# Patient Record
Sex: Female | Born: 1990 | Race: White | Hispanic: No | Marital: Married
Health system: Southern US, Community
[De-identification: ages and names within clinical notes are randomized; demographics above are authoritative.]

## PROBLEM LIST (undated history)

## (undated) DIAGNOSIS — F988 Other specified behavioral and emotional disorders with onset usually occurring in childhood and adolescence: Secondary | ICD-10-CM

## (undated) DIAGNOSIS — M5481 Occipital neuralgia: Secondary | ICD-10-CM

---

## 2016-05-01 ENCOUNTER — Other Ambulatory Visit (HOSPITAL_COMMUNITY): Payer: Self-pay

## 2016-05-01 ENCOUNTER — Inpatient Hospital Stay
Admission: RE | Admit: 2016-05-01 | Discharge: 2016-05-20 | Disposition: A | Discharge status: Rehabilitation Facility | Payer: Self-pay | Attending: Internal Medicine | Admitting: Internal Medicine

## 2016-05-01 DIAGNOSIS — I619 Nontraumatic intracerebral hemorrhage, unspecified: Secondary | ICD-10-CM

## 2016-05-01 DIAGNOSIS — Z931 Gastrostomy status: Secondary | ICD-10-CM

## 2016-05-01 DIAGNOSIS — I629 Nontraumatic intracranial hemorrhage, unspecified: Secondary | ICD-10-CM

## 2016-05-01 DIAGNOSIS — J9690 Respiratory failure, unspecified, unspecified whether with hypoxia or hypercapnia: Secondary | ICD-10-CM

## 2016-05-01 HISTORY — DX: Other specified behavioral and emotional disorders with onset usually occurring in childhood and adolescence: F98.8

## 2016-05-01 HISTORY — DX: Occipital neuralgia: M54.81

## 2016-05-01 MED ORDER — IOPAMIDOL (ISOVUE-300) INJECTION 61%
INTRAVENOUS | Status: AC
  Administered 2016-05-01: 30 mL via GASTROSTOMY
  Filled 2016-05-01: qty 50

## 2016-05-02 ENCOUNTER — Other Ambulatory Visit (HOSPITAL_COMMUNITY): Payer: Self-pay

## 2016-05-02 LAB — COMPREHENSIVE METABOLIC PANEL
ALK PHOS: 103 U/L (ref 38–126)
ALT: 59 U/L — AB (ref 14–54)
AST: 34 U/L (ref 15–41)
Albumin: 3.5 g/dL (ref 3.5–5.0)
Anion gap: 9 (ref 5–15)
BUN: 21 mg/dL — AB (ref 6–20)
CHLORIDE: 100 mmol/L — AB (ref 101–111)
CO2: 31 mmol/L (ref 22–32)
CREATININE: 0.65 mg/dL (ref 0.44–1.00)
Calcium: 9.7 mg/dL (ref 8.9–10.3)
GFR calc Af Amer: >60 mL/min (ref >60)
Glucose, Bld: 97 mg/dL (ref 65–99)
Potassium: 4 mmol/L (ref 3.5–5.1)
Sodium: 140 mmol/L (ref 135–145)
Total Bilirubin: 0.5 mg/dL (ref 0.3–1.2)
Total Protein: 7.1 g/dL (ref 6.5–8.1)

## 2016-05-02 LAB — CBC WITH DIFFERENTIAL/PLATELET
Basophils Absolute: 0 10*3/uL (ref 0.0–0.1)
Basophils Relative: 0 %
EOS ABS: 0.3 10*3/uL (ref 0.0–0.7)
EOS PCT: 4 %
HCT: 32.4 % — ABNORMAL LOW (ref 36.0–46.0)
Hemoglobin: 9.7 g/dL — ABNORMAL LOW (ref 12.0–15.0)
LYMPHS ABS: 2.3 10*3/uL (ref 0.7–4.0)
Lymphocytes Relative: 34 %
MCH: 27.3 pg (ref 26.0–34.0)
MCHC: 29.9 g/dL — AB (ref 30.0–36.0)
MCV: 91.3 fL (ref 78.0–100.0)
Monocytes Absolute: 0.7 10*3/uL (ref 0.1–1.0)
Monocytes Relative: 10 %
Neutro Abs: 3.4 10*3/uL (ref 1.7–7.7)
Neutrophils Relative %: 52 %
PLATELETS: 617 10*3/uL — AB (ref 150–400)
RBC: 3.55 MIL/uL — AB (ref 3.87–5.11)
RDW: 15.3 % (ref 11.5–15.5)
WBC: 6.6 10*3/uL (ref 4.0–10.5)

## 2016-05-05 LAB — BASIC METABOLIC PANEL
Anion gap: 12 (ref 5–15)
BUN: 16 mg/dL (ref 6–20)
CALCIUM: 9.9 mg/dL (ref 8.9–10.3)
CO2: 28 mmol/L (ref 22–32)
CREATININE: 0.59 mg/dL (ref 0.44–1.00)
Chloride: 100 mmol/L — ABNORMAL LOW (ref 101–111)
Glucose, Bld: 79 mg/dL (ref 65–99)
Potassium: 4.5 mmol/L (ref 3.5–5.1)
SODIUM: 140 mmol/L (ref 135–145)

## 2016-05-05 LAB — CBC
HCT: 37.8 % (ref 36.0–46.0)
Hemoglobin: 11.5 g/dL — ABNORMAL LOW (ref 12.0–15.0)
MCH: 27.9 pg (ref 26.0–34.0)
MCHC: 30.4 g/dL (ref 30.0–36.0)
MCV: 91.7 fL (ref 78.0–100.0)
PLATELETS: 526 10*3/uL — AB (ref 150–400)
RBC: 4.12 MIL/uL (ref 3.87–5.11)
RDW: 15.3 % (ref 11.5–15.5)
WBC: 8.8 10*3/uL (ref 4.0–10.5)

## 2016-05-07 LAB — TSH: TSH: 1.893 u[IU]/mL (ref 0.350–4.500)

## 2016-05-07 LAB — CORTISOL: CORTISOL PLASMA: 14.9 ug/dL

## 2016-05-09 LAB — CBC
HCT: 35.9 % — ABNORMAL LOW (ref 36.0–46.0)
HEMOGLOBIN: 11.1 g/dL — AB (ref 12.0–15.0)
MCH: 28.2 pg (ref 26.0–34.0)
MCHC: 30.9 g/dL (ref 30.0–36.0)
MCV: 91.3 fL (ref 78.0–100.0)
Platelets: 474 10*3/uL — ABNORMAL HIGH (ref 150–400)
RBC: 3.93 MIL/uL (ref 3.87–5.11)
RDW: 15.5 % (ref 11.5–15.5)
WBC: 7.8 10*3/uL (ref 4.0–10.5)

## 2016-05-09 LAB — BASIC METABOLIC PANEL
ANION GAP: 10 (ref 5–15)
BUN: 18 mg/dL (ref 6–20)
CALCIUM: 10 mg/dL (ref 8.9–10.3)
CO2: 27 mmol/L (ref 22–32)
CREATININE: 0.65 mg/dL (ref 0.44–1.00)
Chloride: 103 mmol/L (ref 101–111)
GFR calc non Af Amer: >60 mL/min (ref >60)
Glucose, Bld: 95 mg/dL (ref 65–99)
Potassium: 4.3 mmol/L (ref 3.5–5.1)
SODIUM: 140 mmol/L (ref 135–145)

## 2016-05-14 LAB — BASIC METABOLIC PANEL
Anion gap: 9 (ref 5–15)
BUN: 15 mg/dL (ref 6–20)
CHLORIDE: 104 mmol/L (ref 101–111)
CO2: 28 mmol/L (ref 22–32)
CREATININE: 0.7 mg/dL (ref 0.44–1.00)
Calcium: 9.8 mg/dL (ref 8.9–10.3)
GFR calc non Af Amer: >60 mL/min (ref >60)
Glucose, Bld: 110 mg/dL — ABNORMAL HIGH (ref 65–99)
Potassium: 4 mmol/L (ref 3.5–5.1)
Sodium: 141 mmol/L (ref 135–145)

## 2016-05-14 LAB — CBC
HEMATOCRIT: 36.7 % (ref 36.0–46.0)
HEMOGLOBIN: 11.3 g/dL — AB (ref 12.0–15.0)
MCH: 27.8 pg (ref 26.0–34.0)
MCHC: 30.8 g/dL (ref 30.0–36.0)
MCV: 90.2 fL (ref 78.0–100.0)
Platelets: 496 10*3/uL — ABNORMAL HIGH (ref 150–400)
RBC: 4.07 MIL/uL (ref 3.87–5.11)
RDW: 15.2 % (ref 11.5–15.5)
WBC: 7.8 10*3/uL (ref 4.0–10.5)

## 2016-05-15 ENCOUNTER — Other Ambulatory Visit (HOSPITAL_COMMUNITY): Payer: Self-pay

## 2016-05-19 ENCOUNTER — Inpatient Hospital Stay (HOSPITAL_BASED_OUTPATIENT_CLINIC_OR_DEPARTMENT_OTHER)
Admission: RE | Admit: 2016-05-19 | Discharge: 2016-05-19 | Disposition: A | Discharge status: Home / Self Care | Payer: Self-pay | Source: Home / Self Care | Attending: Internal Medicine | Admitting: Internal Medicine

## 2016-05-19 ENCOUNTER — Encounter: Payer: Self-pay | Admitting: Physical Medicine and Rehabilitation

## 2016-05-19 DIAGNOSIS — M7989 Other specified soft tissue disorders: Secondary | ICD-10-CM

## 2016-05-19 LAB — BASIC METABOLIC PANEL
Anion gap: 12 (ref 5–15)
BUN: 14 mg/dL (ref 6–20)
CALCIUM: 10 mg/dL (ref 8.9–10.3)
CO2: 26 mmol/L (ref 22–32)
CREATININE: 0.68 mg/dL (ref 0.44–1.00)
Chloride: 103 mmol/L (ref 101–111)
GFR calc Af Amer: >60 mL/min (ref >60)
Glucose, Bld: 82 mg/dL (ref 65–99)
Potassium: 4.4 mmol/L (ref 3.5–5.1)
SODIUM: 141 mmol/L (ref 135–145)

## 2016-05-19 LAB — CBC
HCT: 40.3 % (ref 36.0–46.0)
HEMOGLOBIN: 12.5 g/dL (ref 12.0–15.0)
MCH: 27.8 pg (ref 26.0–34.0)
MCHC: 31 g/dL (ref 30.0–36.0)
MCV: 89.8 fL (ref 78.0–100.0)
PLATELETS: 433 10*3/uL — AB (ref 150–400)
RBC: 4.49 MIL/uL (ref 3.87–5.11)
RDW: 15.3 % (ref 11.5–15.5)
WBC: 8.7 10*3/uL (ref 4.0–10.5)

## 2016-05-19 NOTE — Progress Notes (Signed)
VASCULAR LAB PRELIMINARY  PRELIMINARY  PRELIMINARY  PRELIMINARY  Left lower extremity venous duplex completed.    Preliminary report:  Left:  No evidence of DVT, superficial thrombosis, or Baker's cyst.  Cheryl Gibbs, RVS 05/19/2016, 5:06 PM

## 2016-05-20 ENCOUNTER — Inpatient Hospital Stay (HOSPITAL_COMMUNITY)
Admission: RE | Admit: 2016-05-20 | Discharge: 2016-07-09 | DRG: 057 | Disposition: A | Discharge status: Other Acute Inpatient Hospital | Payer: Managed Care, Other (non HMO) | Source: Other Acute Inpatient Hospital | Attending: Physical Medicine & Rehabilitation | Admitting: Physical Medicine & Rehabilitation

## 2016-05-20 ENCOUNTER — Encounter: Payer: Self-pay | Admitting: Physical Medicine and Rehabilitation

## 2016-05-20 DIAGNOSIS — R1310 Dysphagia, unspecified: Secondary | ICD-10-CM | POA: Diagnosis present

## 2016-05-20 DIAGNOSIS — F988 Other specified behavioral and emotional disorders with onset usually occurring in childhood and adolescence: Secondary | ICD-10-CM | POA: Diagnosis present

## 2016-05-20 DIAGNOSIS — F4310 Post-traumatic stress disorder, unspecified: Secondary | ICD-10-CM | POA: Diagnosis present

## 2016-05-20 DIAGNOSIS — I69220 Aphasia following other nontraumatic intracranial hemorrhage: Secondary | ICD-10-CM

## 2016-05-20 DIAGNOSIS — N390 Urinary tract infection, site not specified: Secondary | ICD-10-CM | POA: Diagnosis not present

## 2016-05-20 DIAGNOSIS — I69191 Dysphagia following nontraumatic intracerebral hemorrhage: Secondary | ICD-10-CM

## 2016-05-20 DIAGNOSIS — I629 Nontraumatic intracranial hemorrhage, unspecified: Secondary | ICD-10-CM | POA: Diagnosis not present

## 2016-05-20 DIAGNOSIS — F4321 Adjustment disorder with depressed mood: Secondary | ICD-10-CM | POA: Diagnosis present

## 2016-05-20 DIAGNOSIS — R4701 Aphasia: Secondary | ICD-10-CM | POA: Diagnosis not present

## 2016-05-20 DIAGNOSIS — I69254 Hemiplegia and hemiparesis following other nontraumatic intracranial hemorrhage affecting left non-dominant side: Secondary | ICD-10-CM | POA: Diagnosis not present

## 2016-05-20 DIAGNOSIS — Z930 Tracheostomy status: Secondary | ICD-10-CM

## 2016-05-20 DIAGNOSIS — I959 Hypotension, unspecified: Secondary | ICD-10-CM | POA: Diagnosis not present

## 2016-05-20 DIAGNOSIS — G8110 Spastic hemiplegia affecting unspecified side: Secondary | ICD-10-CM | POA: Diagnosis not present

## 2016-05-20 DIAGNOSIS — R4189 Other symptoms and signs involving cognitive functions and awareness: Secondary | ICD-10-CM | POA: Diagnosis present

## 2016-05-20 DIAGNOSIS — I69391 Dysphagia following cerebral infarction: Secondary | ICD-10-CM | POA: Diagnosis not present

## 2016-05-20 DIAGNOSIS — G629 Polyneuropathy, unspecified: Secondary | ICD-10-CM

## 2016-05-20 DIAGNOSIS — R4702 Dysphasia: Secondary | ICD-10-CM | POA: Diagnosis not present

## 2016-05-20 DIAGNOSIS — M2450 Contracture, unspecified joint: Secondary | ICD-10-CM | POA: Diagnosis not present

## 2016-05-20 DIAGNOSIS — R569 Unspecified convulsions: Secondary | ICD-10-CM | POA: Diagnosis present

## 2016-05-20 DIAGNOSIS — Z9119 Patient's noncompliance with other medical treatment and regimen: Secondary | ICD-10-CM | POA: Diagnosis not present

## 2016-05-20 DIAGNOSIS — G441 Vascular headache, not elsewhere classified: Secondary | ICD-10-CM | POA: Diagnosis present

## 2016-05-20 DIAGNOSIS — R001 Bradycardia, unspecified: Secondary | ICD-10-CM

## 2016-05-20 DIAGNOSIS — R609 Edema, unspecified: Secondary | ICD-10-CM | POA: Diagnosis not present

## 2016-05-20 DIAGNOSIS — R2689 Other abnormalities of gait and mobility: Secondary | ICD-10-CM | POA: Diagnosis present

## 2016-05-20 DIAGNOSIS — S06349S Traumatic hemorrhage of right cerebrum with loss of consciousness of unspecified duration, sequela: Secondary | ICD-10-CM

## 2016-05-20 DIAGNOSIS — Z931 Gastrostomy status: Secondary | ICD-10-CM | POA: Diagnosis not present

## 2016-05-20 DIAGNOSIS — B9620 Unspecified Escherichia coli [E. coli] as the cause of diseases classified elsewhere: Secondary | ICD-10-CM | POA: Diagnosis not present

## 2016-05-20 LAB — URINALYSIS, ROUTINE W REFLEX MICROSCOPIC
BILIRUBIN URINE: NEGATIVE
GLUCOSE, UA: NEGATIVE mg/dL
HGB URINE DIPSTICK: NEGATIVE
KETONES UR: NEGATIVE mg/dL
NITRITE: POSITIVE — AB
PH: 7 (ref 5.0–8.0)
Protein, ur: NEGATIVE mg/dL
Specific Gravity, Urine: 1.016 (ref 1.005–1.030)

## 2016-05-20 LAB — MRSA PCR SCREENING: MRSA by PCR: NEGATIVE

## 2016-05-20 LAB — GLUCOSE, CAPILLARY: Glucose-Capillary: 102 mg/dL — ABNORMAL HIGH (ref 65–99)

## 2016-05-20 MED ORDER — PROCHLORPERAZINE EDISYLATE 5 MG/ML IJ SOLN: 5.0000 mg | Freq: Four times a day (QID) | INTRAMUSCULAR | Status: DC | PRN

## 2016-05-20 MED ORDER — ADULT MULTIVITAMIN LIQUID CH
15.0000 mL | Freq: Every day | ORAL | Status: DC
  Administered 2016-05-20 – 2016-06-19 (×31): 15 mL
  Filled 2016-05-20 (×33): qty 15

## 2016-05-20 MED ORDER — FREE WATER
100.0000 mL | Freq: Three times a day (TID) | Status: DC
  Administered 2016-05-20 – 2016-05-21 (×4): 100 mL

## 2016-05-20 MED ORDER — HYDROCODONE-ACETAMINOPHEN 7.5-325 MG/15ML PO SOLN
10.0000 mL | ORAL | Status: DC | PRN
  Administered 2016-05-20 – 2016-05-24 (×16): 10 mL via ORAL
  Filled 2016-05-20 (×17): qty 15

## 2016-05-20 MED ORDER — ATORVASTATIN CALCIUM 20 MG PO TABS
20.0000 mg | ORAL_TABLET | Freq: Every day | ORAL | Status: DC
  Administered 2016-05-21 – 2016-06-21 (×32): 20 mg
  Filled 2016-05-20 (×33): qty 1

## 2016-05-20 MED ORDER — DIPHENHYDRAMINE HCL 12.5 MG/5ML PO ELIX: 12.5000 mg | ORAL_SOLUTION | Freq: Four times a day (QID) | ORAL | Status: DC | PRN

## 2016-05-20 MED ORDER — PROCHLORPERAZINE 25 MG RE SUPP: 12.5000 mg | Freq: Four times a day (QID) | RECTAL | Status: DC | PRN

## 2016-05-20 MED ORDER — BISACODYL 10 MG RE SUPP: 10.0000 mg | Freq: Every day | RECTAL | Status: DC | PRN

## 2016-05-20 MED ORDER — LEVETIRACETAM 100 MG/ML PO SOLN
1000.0000 mg | Freq: Two times a day (BID) | ORAL | Status: DC
  Administered 2016-05-20 – 2016-06-23 (×68): 1000 mg
  Filled 2016-05-20 (×69): qty 10

## 2016-05-20 MED ORDER — POLYETHYLENE GLYCOL 3350 17 G PO PACK
17.0000 g | PACK | Freq: Every day | ORAL | Status: DC | PRN
  Administered 2016-06-07 – 2016-07-04 (×3): 17 g via ORAL
  Filled 2016-05-20: qty 1

## 2016-05-20 MED ORDER — FLEET ENEMA 7-19 GM/118ML RE ENEM: 1.0000 | ENEMA | Freq: Once | RECTAL | Status: DC | PRN

## 2016-05-20 MED ORDER — GUAIFENESIN-DM 100-10 MG/5ML PO SYRP: 5.0000 mL | ORAL_SOLUTION | Freq: Four times a day (QID) | ORAL | Status: DC | PRN

## 2016-05-20 MED ORDER — POLYETHYLENE GLYCOL 3350 17 G PO PACK
17.0000 g | PACK | Freq: Every day | ORAL | Status: DC
  Administered 2016-05-21 – 2016-07-03 (×33): 17 g
  Filled 2016-05-20 (×41): qty 1

## 2016-05-20 MED ORDER — ALUM & MAG HYDROXIDE-SIMETH 200-200-20 MG/5ML PO SUSP
30.0000 mL | ORAL | Status: DC | PRN
  Administered 2016-05-23 – 2016-07-06 (×2): 30 mL via ORAL
  Filled 2016-05-20 (×2): qty 30

## 2016-05-20 MED ORDER — SERTRALINE HCL 50 MG PO TABS
50.0000 mg | ORAL_TABLET | Freq: Every day | ORAL | Status: DC
  Administered 2016-05-21 – 2016-06-13 (×24): 50 mg
  Filled 2016-05-20 (×24): qty 1

## 2016-05-20 MED ORDER — INSULIN ASPART 100 UNIT/ML ~~LOC~~ SOLN
0.0000 [IU] | Freq: Four times a day (QID) | SUBCUTANEOUS | Status: DC
  Administered 2016-05-21 – 2016-06-09 (×6): 1 [IU] via SUBCUTANEOUS

## 2016-05-20 MED ORDER — ORAL CARE MOUTH RINSE
15.0000 mL | Freq: Two times a day (BID) | OROMUCOSAL | Status: DC
  Administered 2016-05-21 – 2016-06-23 (×31): 15 mL via OROMUCOSAL

## 2016-05-20 MED ORDER — SERTRALINE HCL 20 MG/ML PO CONC
50.0000 mg | Freq: Every day | ORAL | Status: DC
  Filled 2016-05-20: qty 2.5

## 2016-05-20 MED ORDER — QUETIAPINE FUMARATE 25 MG PO TABS
12.5000 mg | ORAL_TABLET | Freq: Every evening | ORAL | Status: DC | PRN
  Administered 2016-05-23 – 2016-06-07 (×10): 12.5 mg
  Filled 2016-05-20 (×11): qty 1

## 2016-05-20 MED ORDER — FERROUS SULFATE 75 (15 FE) MG/ML PO SOLN
15.0000 mg | Freq: Two times a day (BID) | ORAL | Status: DC
  Administered 2016-05-20 – 2016-06-22 (×66): 15 mg
  Filled 2016-05-20 (×69): qty 1

## 2016-05-20 MED ORDER — FAMOTIDINE 40 MG/5ML PO SUSR
20.0000 mg | Freq: Two times a day (BID) | ORAL | Status: DC
  Administered 2016-05-20 – 2016-06-22 (×66): 20 mg
  Filled 2016-05-20 (×66): qty 2.5

## 2016-05-20 MED ORDER — ACETAMINOPHEN 325 MG PO TABS
325.0000 mg | ORAL_TABLET | ORAL | Status: DC | PRN
  Administered 2016-05-25 – 2016-07-08 (×29): 650 mg via ORAL
  Filled 2016-05-20 (×31): qty 2

## 2016-05-20 MED ORDER — TOPIRAMATE 25 MG PO TABS
25.0000 mg | ORAL_TABLET | Freq: Every day | ORAL | Status: DC
  Administered 2016-05-20: 25 mg
  Filled 2016-05-20: qty 1

## 2016-05-20 MED ORDER — FOLIC ACID 1 MG PO TABS
1.0000 mg | ORAL_TABLET | Freq: Every day | ORAL | Status: DC
  Administered 2016-05-21 – 2016-06-22 (×33): 1 mg
  Filled 2016-05-20 (×33): qty 1

## 2016-05-20 MED ORDER — TRAZODONE HCL 50 MG PO TABS
25.0000 mg | ORAL_TABLET | Freq: Every evening | ORAL | Status: DC | PRN
  Administered 2016-05-20 – 2016-07-06 (×16): 50 mg via ORAL
  Filled 2016-05-20 (×16): qty 1

## 2016-05-20 MED ORDER — CHLORHEXIDINE GLUCONATE 0.12 % MT SOLN
15.0000 mL | Freq: Two times a day (BID) | OROMUCOSAL | Status: DC
  Administered 2016-05-20 – 2016-06-11 (×39): 15 mL via OROMUCOSAL
  Filled 2016-05-20 (×43): qty 15

## 2016-05-20 MED ORDER — PROCHLORPERAZINE MALEATE 5 MG PO TABS
5.0000 mg | ORAL_TABLET | Freq: Four times a day (QID) | ORAL | Status: DC | PRN
  Administered 2016-05-29: 10 mg via ORAL
  Administered 2016-05-31: 5 mg via ORAL
  Filled 2016-05-20 (×2): qty 2
  Filled 2016-05-20: qty 1

## 2016-05-20 MED ORDER — OSMOLITE 1.5 CAL PO LIQD
1000.0000 mL | ORAL | Status: DC
  Administered 2016-05-20: 1000 mL
  Filled 2016-05-20 (×3): qty 1000

## 2016-05-20 NOTE — H&P (Signed)
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    HPI: Cheryl Gibbs is a 25 year old female who was 10 days post partum and  was transferred to [facility] from OSH on 04/11/16  after being found unresponsive in by husband.  History taken from chart review and mother.  Patient in bathtub--not submerged and activity seizing. 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  She underwent cerebral angiogram and decline neurologically shortly after admission to ICU. She was taken to OR emergently for right craniotomy for evacuation of IPH and obliteration of ruptured lenticulostriate aneurysm and placement of EVD. She was place on Keppra for seizure prophylaxis and her hospital course significant for VDRF with sepsis, diffuse vasospasms and difficulty with vent wean requiring  Trach and PEG on 11/22.    She was transferred to [facility] on 12/8 and has been weaned off vent. She has been tolerating trach plugging and agitation. Constipation resolved with addition of miralax. On continuous tube feeds and remains NPO. She continues to have right frontal HA as well as severe pain LUE/LLE--LLE dopplers negative for DVT on 12/27.  Follow up CT head done due to increase bulging through crani defect and revealed increase in extra axial fluid. She is able to to follow simple commands and write answers due to non-verbal state. Patient with resultant  expressive > receptive deficits, does not make attempts to verbalize, dysphagia, right gaze preference, left sided weaknesses affecting mobility and self care tasks.      Per mother: Review of Systems  Unable to perform ROS: Patient nonverbal  Musculoskeletal: Positive for neck pain.       Has been having pain LLE > LUE  Neurological: Positive  for sensory change (sharp pain in LUE/LLE), speech change, focal weakness and headaches (right frontal headaches. ).  All other systems reviewed and are negative.     Past Medical History:  Diagnosis Date  . ADD (attention deficit disorder)   . Occipital neuralgia      Past Surgical History:  Procedure Laterality Date  . CESAREAN SECTION  03/31/2016     Family History  Problem Relation Age of Onset  . Healthy Mother   . Diabetes Father   . Diabetes Paternal Grandmother       Social History:  Married--husband recently [employer]. Grandmother helping with children. Plans to discharge to mother's home in [place]. She used to work part time as delivery person. Per reports she quit tobacco Feb 2017.  has no tobacco, alcohol, and drug history on file.    :  Allergies  Allergen Reactions  . Latex       No prescriptions prior to admission.    Home: Home Living Living Arrangements: Spouse/significant other, Children Available Help at Discharge: Family, Available 24 hours/day Type of Home: House (Was a rental home.  Gave up rental home.) Additional Comments: Husband has deployed to [place], [place]  Lives With: Spouse, Family   Functional History: Prior Function Level of Independence: Independent  Functional Status:  Mobility:          ADL:    Cognition:      Physical Exam: Height '5\' 3"'  (1.6 m), weight 84.4 kg (186 lb 1.6 oz), last menstrual period 05/01/2016. Physical Exam  Nursing note  and vitals reviewed. Constitutional: She appears well-developed and well-nourished.  HENT:  Mouth/Throat: Oropharynx is clear and moist.  Right crani incision well healed. Crani defect with bogginess right frontal scalp and dependent bulging at lower aspect near left ear.    Eyes: Conjunctivae are normal. Pupils are equal, round, and reactive to light.  Right gaze preference and  Unable to move eyes to left field--left visual field deficits noted.   Neck: Neck supple.    CFS # 4 trach in place with button plug.   Cardiovascular: Normal rate and regular rhythm.   Respiratory: Effort normal and breath sounds normal. No stridor. No respiratory distress. She has no wheezes.  GI: Soft. Bowel sounds are normal. She exhibits no distension. There is no tenderness.  PEG site clean and dry  Musculoskeletal:  No edema or tenderness in extremities  Neurological: She is alert.  Non verbal--able to protrude tongues with max cues--apraxia noted.   Nods yes to most questions.  She was able to point appropriately and able to write answers which were occasionally hard to decipher. She needed  encouragement to write. Appears distracted and unable to attend to task without cues. She was unable to make sounds except for cries with  LUE exam. Unable to move eyes to left field and ?visual deficits. Spatic left hemiparesis with tight left heel cord. Dysesthesias LUE.  Moves RUE/RLE freely  Skin: Skin is warm and dry.  Psychiatric: Her affect is inappropriate. Her speech is delayed. She is slowed. Cognition and memory are impaired. She expresses impulsivity. She is inattentive.    Results for orders placed or performed during the hospital encounter of 05/01/16 (from the past 48 hour(s))  CBC     Status: Abnormal   Collection Time: 05/19/16  7:00 AM  Result Value Ref Range   WBC 8.7 4.0 - 10.5 K/uL   RBC 4.49 3.87 - 5.11 MIL/uL   Hemoglobin 12.5 12.0 - 15.0 g/dL   HCT 40.3 36.0 - 46.0 %   MCV 89.8 78.0 - 100.0 fL   MCH 27.8 26.0 - 34.0 pg   MCHC 31.0 30.0 - 36.0 g/dL   RDW 15.3 11.5 - 15.5 %   Platelets 433 (H) 150 - 400 K/uL  Basic metabolic panel     Status: None   Collection Time: 05/19/16  7:00 AM  Result Value Ref Range   Sodium 141 135 - 145 mmol/L   Potassium 4.4 3.5 - 5.1 mmol/L   Chloride 103 101 - 111 mmol/L   CO2 26 22 - 32 mmol/L   Glucose, Bld 82 65 - 99 mg/dL   BUN 14 6 - 20 mg/dL   Creatinine, Ser 0.68 0.44 - 1.00 mg/dL   Calcium 10.0 8.9 - 10.3 mg/dL    GFR calc non Af Amer >60 >60 mL/min   GFR calc Af Amer >60 >60 mL/min    Comment: (NOTE) The eGFR has been calculated using the CKD EPI equation. This calculation has not been validated in all clinical situations. eGFR's persistently <60 mL/min signify possible Chronic Kidney Disease.    Anion gap 12 5 - 15   No results found.     Medical Problem List and Plan: 1.  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 2.  DVT Prophylaxis/Anticoagulation: Mechanical: Sequential compression devices, below knee Bilateral lower extremities. LLE dopplers 12/26 negative for DVT 3. Headaches/Pain Management: Has been getting IV morphine for HA--indicating constant. Will add topamax and  monitor.  4. Mood: LCSW to follow for evaluation and support when appropriate.  5. Neuropsych: This patient is not capable of making decisions on her own behalf. 6. Skin/Wound Care: routine pressure relief measures.  7. Fluids/Electrolytes/Nutrition: Monitor output. Will consult RD to attempt bolus tube feeds. NPO for now.  8. VDRF: Has been tolerating button plug for past 1-2 weeks per reports. 9. New onset seizures: Continue Keppra bid.  10. Spasticity/Neuropathy LUE/LLE: Will need medications augmented to help manage symptoms.     Post Admission Physician Evaluation: Functional deficits secondary  to R frontal IPH/IVH s/p right craniotomy Preadmission assessment reviewed and changes made below. 1. Patient is admitted to receive collaborative, interdisciplinary care between the physiatrist, rehab nursing staff, and therapy team. 2. Patient's level of medical complexity and substantial therapy needs in context of that medical necessity cannot be provided at a lesser intensity of care such as a SNF. 3. Patient has experienced substantial functional loss from his/her baseline which was documented above under the "Functional History" and "Functional  Status" headings.  Judging by the patient's diagnosis, physical exam, and functional history, the patient has potential for functional progress which will result in measurable gains while on inpatient rehab.  These gains will be of substantial and practical use upon discharge  in facilitating mobility and self-care at the household level. 4. Physiatrist will provide 24 hour management of medical needs as well as oversight of the therapy plan/treatment and provide guidance as appropriate regarding the interaction of the two. 5. The Preadmission Screening has been reviewed and patient status is unchanged unless otherwise stated above. 6. 24 hour rehab nursing will assist with bladder management, bowel management, safety, skin/wound care, disease management, medication administration, pain management and patient education  and help integrate therapy concepts, techniques,education, etc. 7. PT will assess and treat for/with: Lower extremity strength, range of motion, stamina, balance, functional mobility, safety, adaptive techniques and equipment, woundcare, coping skills, pain control, education.   Goals are: Mod A. 8. OT will assess and treat for/with: ADL's, functional mobility, safety, upper extremity strength, adaptive techniques and equipment, wound mgt, ego support, and community reintegration.   Goals are: Mod A/Max. Therapy may proceed with showering this patient. 9. SLP will assess and treat for/with: speech, language, swallowing, cognition.  Goals are: Min/Mod A. 10. Case Management and Social Worker will assess and treat for psychological issues and discharge planning. 11. Team conference will be held weekly to assess progress toward goals and to determine barriers to discharge. 12. Patient will receive at least 3 hours of therapy per day at least 5 days per week. 13. ELOS: 22-26 days.       14. Prognosis:  good and fair   Delice Lesch, MD, [address], [place] 05/20/2016

## 2016-05-20 NOTE — Progress Notes (Signed)
Nurse spoke with MD on call, Delle Reiningamela Love, in regards to patient and mother concerns of urinary urgency and frequency. Per mother's thoughts and concerns, patient had a UTI before transfer to current floor and room. Pt c/o urinary burning and frequency and mother states an increase since transfer. MD TVORB bladder scan patient, in and out cath patient with U/A and culture. MD will await for results in the morning, does not want to be informed of results and did not order anything for dysuria while waiting for U/A and culture.

## 2016-05-20 NOTE — Progress Notes (Signed)
Admit to unit from select via bed. Oriented to unit and rehab routine.Mother at bedside, reviewed orders and plan of care. States understanding of information given. Pamelia HoitSharp, Aleck Locklin B

## 2016-05-20 NOTE — PMR Pre-admission (Signed)
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 Date: 09/02/15     Deduct:  $700 (all met)      Out of Pocket Max: $4000 (all met)      Life Max: unlimited CIR: 80%      SNF: 80% Outpatient: 80% with medical necessity     Co-Pay: 20% Home Health: 80%      Co-Pay: 20% DME: 80%     Co-Pay: 20% Providers: in network  Medicaid Application Date:        Case Manager:   Disability Application Date:        Case Worker:    Emergency Contact Information Contact Information    Name Relation Home Work Mobile   [place] Mother  [phone]   Laws,Jacob Spouse [phone]       Current Medical History  Patient Admitting Diagnosis:  R frontal IPH/IVH with right craniotomy  History of Present Illness: A 25 year old female who was 10 days post partum and  was transferred to [facility] from OSH on 04/11/16  after being found unresponsive by husband.  Patient in bathtub--not submerged and activity seizing. 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  She underwent cerebral angiogram and decline neurologically shortly after admission to ICU. She was taken to OR emergently for right craniotomy for evacuation of IPH and obliteration of ruptured  lenticulostriate aneurysm and placement of EVD. She was placed on Keppra for seizure prophylaxis and her hospital course was significant for VDRF with sepsis, diffuse vasospasms and difficulty with vent wean requiring  Trach and PEG were placed on 11/22.  She was admitted to [facility] on 05/01/16.  She has been receiving and participating with PT/OT/SLP and is making progress.  Patient has L inattention.  She has been using a wipe board to write to communicate.  She is non verbal.  She is following commands.  She has occasional crying spells.  She continues to have pain in her left arm and in both legs.  She is not requiring suctioning of her trach at this time.   Patient's medical record from  [facility] has been reviewed by the rehabilitation admission coordinator and physician.  Past Medical History  Past Medical History:  Diagnosis Date  . ADD (attention deficit disorder)   . Occipital neuralgia     Family History   family history is not on file.  Prior Rehab/Hospitalizations Has the patient had major surgery during 100 days prior to admission? Yes.  Underwent a C-section on 03/31/16.   Current Medications  See MAR  from [facility]  Patients Current Diet:   NPO with tube feedings  Precautions / Restrictions Precautions Precautions: Fall Precautions/Special Needs: Swallowing   Has the patient had 2 or more falls or a fall with injury in the past year?No.  Mom reports 1 fall where patient slipped during pregnancy with no injury.  Prior Activity Level Community (5-7x/wk): Was active.  Worked until 18th month of pregnancy.  Delivered baby 03/31/16.  Prior Functional Level Self Care: Did the patient need help bathing, dressing, using the toilet or eating?  Independent  Indoor Mobility: Did the patient need assistance with walking from room to room (with or without device)? Independent  Stairs: Did the patient need assistance with  internal or external stairs (with or without device)? Independent  Functional Cognition: Did the patient need help planning regular tasks such as shopping or remembering to take medications? Independent  Home Assistive Devices / Equipment    Prior Device Use: Indicate devices/aids used by the patient prior to current illness, exacerbation or injury? None   Prior Functional Level Current Functional Level  Bed Mobility  Independent  Max assist   Transfers  Independent  Max assist   Mobility - Walk/Wheelchair  Independent  Max assist   Upper Body Dressing  Independent  Total assist   Lower Body Dressing  Independent  Total assist   Grooming  Independent  Total assist   Eating/Drinking  Independent  Other (Currently NPO with PEG tube feedings)   Toilet Transfer  Independent  Max assist   Bladder Continence   WDL  Incontinence, uses bedpan inconsistently   Bowel Management  WDL  Last BM 05/19/16 with incontinence   Stair Climbing  Independent Total assist   Communication  Intact  Non verbal.  Writes on a wipe board.  Does follow commands.   Memory  Intact  Impaired   Cooking/Meal Prep  Independent      Housework  Independent    Money Management  Independent    Driving   Independent      Special needs/care consideration BiPAP/CPAP No CPM No Continuous Drip IV No Dialysis No       Life Vest No Oxygen No Special Bed No Trach Size Yes #4 shiley cuffless capped trach Wound Vac (area) No      Skin Has PEG tube site and trach stoma site                            Bowel mgmt: Last BM 05/19/16 with incontinence Bladder mgmt: Voiding with incontinence and sometimes on bedpan Diabetic mgmt No  Previous Home Environment Living Arrangements: Spouse/significant other, Children  Lives With: Spouse, Family Available Help at Discharge: Family, Available 24 hours/day Type of Home: House (Was a rental home.  Gave up rental home.) Additional  Comments: Husband has deployed to [facility], [place]  Discharge Living Setting Plans for Discharge Living Setting: House, Lives with (comment) (Plans home with mom.) Type of Home at Discharge: House Discharge Home Layout: One level Discharge Home Access: Level entry (Level entry at garage.) Does the patient have any problems obtaining your medications?: No  Social/Family/Support Systems Patient Roles: Spouse, Parent, Other (Comment) (Has a husband, mom and 3 children.) Contact Information: Blasa Raisch - mom Anticipated Caregiver: mom and extended family Anticipated Caregiver's Contact Information: (c) [phone] (w) [phone]  Ability/Limitations of Caregiver: Mom works but she can work from home.  Has large exteded family closeby who can assist.  Caregiver Availability: 24/7 Discharge Plan Discussed with Primary Caregiver: Yes Is Caregiver In Agreement with Plan?: Yes Does Caregiver/Family have Issues with Lodging/Transportation while Pt is in Rehab?: No  Goals/Additional Needs Patient/Family Goal for Rehab: Hopeful for supervision to min assist, but may be min to mod assist goals Expected length of stay: 3-4 weeks (may need 4.5 weeks) Cultural Considerations: Christian Dietary Needs: NPO with tube feedings Equipment Needs: TBD Additional Information: Newborn baby on 03/31/16, patient's second child.  Has a 20 yo her husband's child, a 76 yo of her own, and the newborn.  Children are with husband's grandmother. Pt/Family Agrees to Admission and willing to participate: Yes Program Orientation Provided & Reviewed with Pt/Caregiver Including Roles  & Responsibilities: Yes  Patient Condition:  I met with patient and her mother at [facility] and evaluated patient.  Patient is postpartum 03/31/16 with R ICH with craniotomy.  Patient has been receiving PT/OT/SLP on [facility].  Patient can tolerate and benefit from 3 hours of therapy a day.  Patient needs the  coordinated team approach of the rehab unit in order to make significant functional gains.  The goal is home with mom providing care at the end of inpatient rehab stay.  Preadmission Screen Completed By:  Retta Diones, 05/20/2016 12:43 PM ______________________________________________________________________   Discussed status with Dr. Posey Pronto on 05/20/16 at 1243 and received telephone approval for admission today.  Admission Coordinator:  Retta Diones, time 1243/Date 05/20/16   Assessment/Plan: Diagnosis: R frontal IPH/IVH with right craniotomy  1. Does the need for close, 24 hr/day  Medical supervision in concert with the patient's rehab needs make it unreasonable for this patient to be served in a less intensive setting? Yes 2. Co-Morbidities requiring supervision/potential complications: aphasia, +trach, +PEG, seizures 3. Due to bladder management, bowel management, safety, skin/wound care, disease management, medication administration, pain management and patient education, does the patient require 24 hr/day rehab nursing? Yes 4. Does the patient require coordinated care of a physician, rehab nurse, PT (1-2 hrs/day, 5 days/week), OT (1-2 hrs/day, 5 days/week) and SLP (1-2 hrs/day, 5 days/week) to address physical and functional deficits in the context of the above medical diagnosis(es)? Yes Addressing deficits in the following areas: balance, endurance, locomotion, strength, transferring, bowel/bladder control, bathing, dressing, feeding, grooming, toileting, cognition, speech, language, swallowing and psychosocial support 5. Can the patient actively participate in an intensive therapy program of at least 3 hrs of therapy 5 days a week? Yes 6. The potential for patient to make measurable gains while on inpatient rehab is excellent 7. Anticipated functional outcomes upon discharge from inpatients are: min assist and mod assist PT, min assist and mod assist OT, min assist and mod assist  SLP 8. Estimated rehab length of stay to reach the above functional goals is: 22-26 days. 9. Does the patient have adequate social supports to accommodate these discharge functional goals? Yes 10. Anticipated D/C setting: Home 11. Anticipated post D/C treatments: HH therapy and Home excercise program 12. Overall Rehab/Functional Prognosis: good and fair    RECOMMENDATIONS: This patient's condition is appropriate for continued rehabilitative care in the following setting: CIR Patient has agreed to participate in recommended program. Yes Note that insurance prior authorization may be required for reimbursement for recommended care.  Retta Diones 05/20/2016  Delice Lesch, MD, [facility]

## 2016-05-21 ENCOUNTER — Inpatient Hospital Stay (HOSPITAL_COMMUNITY): Payer: Managed Care, Other (non HMO) | Admitting: Occupational Therapy

## 2016-05-21 ENCOUNTER — Inpatient Hospital Stay (HOSPITAL_COMMUNITY): Payer: Self-pay

## 2016-05-21 ENCOUNTER — Ambulatory Visit (HOSPITAL_COMMUNITY): Payer: Managed Care, Other (non HMO) | Attending: Physical Medicine and Rehabilitation

## 2016-05-21 ENCOUNTER — Inpatient Hospital Stay (HOSPITAL_COMMUNITY): Payer: Self-pay | Admitting: Physical Therapy

## 2016-05-21 ENCOUNTER — Inpatient Hospital Stay (HOSPITAL_COMMUNITY): Payer: Managed Care, Other (non HMO) | Admitting: Speech Pathology

## 2016-05-21 DIAGNOSIS — R609 Edema, unspecified: Secondary | ICD-10-CM | POA: Insufficient documentation

## 2016-05-21 DIAGNOSIS — I629 Nontraumatic intracranial hemorrhage, unspecified: Secondary | ICD-10-CM

## 2016-05-21 LAB — CBC WITH DIFFERENTIAL/PLATELET
Basophils Absolute: 0 10*3/uL (ref 0.0–0.1)
Basophils Relative: 0 %
Eosinophils Absolute: 0.2 10*3/uL (ref 0.0–0.7)
Eosinophils Relative: 2 %
HEMATOCRIT: 37.2 % (ref 36.0–46.0)
HEMOGLOBIN: 11.5 g/dL — AB (ref 12.0–15.0)
LYMPHS ABS: 1.9 10*3/uL (ref 0.7–4.0)
LYMPHS PCT: 25 %
MCH: 27.4 pg (ref 26.0–34.0)
MCHC: 30.9 g/dL (ref 30.0–36.0)
MCV: 88.8 fL (ref 78.0–100.0)
MONOS PCT: 5 %
Monocytes Absolute: 0.4 10*3/uL (ref 0.1–1.0)
NEUTROS ABS: 5.1 10*3/uL (ref 1.7–7.7)
NEUTROS PCT: 68 %
Platelets: 409 10*3/uL — ABNORMAL HIGH (ref 150–400)
RBC: 4.19 MIL/uL (ref 3.87–5.11)
RDW: 15.2 % (ref 11.5–15.5)
WBC: 7.6 10*3/uL (ref 4.0–10.5)

## 2016-05-21 LAB — COMPREHENSIVE METABOLIC PANEL
ALK PHOS: 96 U/L (ref 38–126)
ALT: 57 U/L — AB (ref 14–54)
ANION GAP: 7 (ref 5–15)
AST: 33 U/L (ref 15–41)
Albumin: 3.4 g/dL — ABNORMAL LOW (ref 3.5–5.0)
BILIRUBIN TOTAL: 0.4 mg/dL (ref 0.3–1.2)
BUN: 14 mg/dL (ref 6–20)
CALCIUM: 9.5 mg/dL (ref 8.9–10.3)
CO2: 26 mmol/L (ref 22–32)
CREATININE: 0.61 mg/dL (ref 0.44–1.00)
Chloride: 105 mmol/L (ref 101–111)
GFR calc non Af Amer: >60 mL/min (ref >60)
Glucose, Bld: 83 mg/dL (ref 65–99)
Potassium: 3.9 mmol/L (ref 3.5–5.1)
SODIUM: 138 mmol/L (ref 135–145)
TOTAL PROTEIN: 7.2 g/dL (ref 6.5–8.1)

## 2016-05-21 LAB — GLUCOSE, CAPILLARY
Glucose-Capillary: 83 mg/dL (ref 65–99)
Glucose-Capillary: 88 mg/dL (ref 65–99)
Glucose-Capillary: 93 mg/dL (ref 65–99)
Glucose-Capillary: 121 mg/dL — ABNORMAL HIGH (ref 65–99)

## 2016-05-21 MED ORDER — PRO-STAT SUGAR FREE PO LIQD
30.0000 mL | Freq: Every day | ORAL | Status: DC
  Administered 2016-05-21 – 2016-06-22 (×31): 30 mL
  Filled 2016-05-21 (×35): qty 30

## 2016-05-21 MED ORDER — OSMOLITE 1.5 CAL PO LIQD
300.0000 mL | Freq: Four times a day (QID) | ORAL | Status: DC
  Administered 2016-05-21 – 2016-05-28 (×27): 300 mL
  Filled 2016-05-21 (×33): qty 474

## 2016-05-21 MED ORDER — GABAPENTIN 250 MG/5ML PO SOLN
100.0000 mg | Freq: Three times a day (TID) | ORAL | Status: DC
  Administered 2016-05-21 – 2016-05-25 (×12): 100 mg via ORAL
  Filled 2016-05-21 (×15): qty 2

## 2016-05-21 MED ORDER — FREE WATER
200.0000 mL | Freq: Three times a day (TID) | Status: DC
  Administered 2016-05-21 – 2016-06-04 (×53): 200 mL

## 2016-05-21 NOTE — Progress Notes (Addendum)
[place] PHYSICAL MEDICINE & REHABILITATION     PROGRESS NOTE    Subjective/Complaints: Up working with PT this morning. Seems to be in good spirits. No problems overnight.  ROS: unobtainable due to language  Objective: Vital Signs: Blood pressure (!) 100/54, pulse 62, temperature 98.9 F (37.2 C), temperature source Axillary, resp. rate 18, height 5\' 4"  (1.626 m), weight 84.7 kg (186 lb 12.8 oz), last menstrual period 05/01/2016, SpO2 95 %. No results found.  Recent Labs  05/19/16 0700 05/21/16 0535  WBC 8.7 7.6  HGB 12.5 11.5*  HCT 40.3 37.2  PLT 433* 409*    Recent Labs  05/19/16 0700 05/21/16 0535  NA 141 138  K 4.4 3.9  CL 103 105  GLUCOSE 82 83  BUN 14 14  CREATININE 0.68 0.61  CALCIUM 10.0 9.5   CBG (last 3)   Recent Labs  05/20/16 2354 05/21/16 0543  GLUCAP 102* 83    Wt Readings from Last 3 Encounters:  05/20/16 84.7 kg (186 lb 12.8 oz)  05/20/16 84.4 kg (186 lb 1.6 oz)    Physical Exam:  Constitutional: She appears well-developed and well-nourished.  HENT:  Mouth/Throat: Oropharynx is clear and moist.  Right crani incision well healed, area intact, soft.    Eyes: Conjunctivae are normal. Pupils are equal, round, and reactive to light.    Neck: Neck supple.  CFS # 4 trach in place with button plug. Tolerating well  Cardiovascular: RRR.   Respiratory: Effort normal and breath sounds normal. Clear bilaterally GI: Soft. Bowel sounds are normal. She exhibits no distension. There is no tenderness.  PEG site clean without drainage.  Musculoskeletal:  No edema or tenderness in extremities  Neurological: She is alert.  Non verbal--able to protrude tongues with max cues--apraxia noted.   Nods yes to most questions. Was able to grasp using right hand when cued and took deep breaths when asked during lung auscultation.   Unable to move eyes to left field and ?visual deficits. Spastic left hemiparesis with tight left heel cord noted---generally  1/4---pain component. Dysesthesias LUE and LLE which limits exam of these as she became noticeably uncomfortable with basic movements.  Moves RUE/RLE freely  Skin: Skin is warm and dry.  Psychiatric: flat, but attempts to cooperate.   Assessment/Plan: 1. 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 Physiatrist is providing close team supervision and 24 hour management of active medical problems listed below. Physiatrist and rehab team continue to assess barriers to discharge/monitor patient progress toward functional and medical goals.  Function:  Bathing Bathing position      Bathing parts      Bathing assist        Upper Body Dressing/Undressing Upper body dressing                    Upper body assist        Lower Body Dressing/Undressing Lower body dressing                                  Lower body assist        Toileting Toileting          Toileting assist     Transfers Chair/bed transfer     Chair/bed transfer assist level: 2 helpers       Locomotion  Ambulation Ambulation activity did not occur: Safety/medical concerns         Wheelchair   Type: Manual   Assist Level: Dependent (Pt equals 0%)  Cognition Comprehension    Expression    Social Interaction    Problem Solving    Memory       Medical Problem List and Plan: 1.  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  -begin therapies today 2.  DVT Prophylaxis/Anticoagulation: Mechanical: Sequential compression devices, below knee Bilateral lower extremities. LLE dopplers 12/26 negative for DVT 3. Headaches/Pain Management:   -will dc topamax and try gabapentin for headaches and dysesthesias 4. Mood: LCSW to follow for evaluation and support when appropriate.  5. Neuropsych:  This patient is not capable of making decisions on her own behalf. 6. Skin/Wound Care: routine pressure relief measures.  7. Fluids/Electrolytes/Nutrition: Monitor output. Transitioning to bolus tube feeds. NPO for now.  -I personally reviewed the patient's labs today and wnl   8. VDRF: Has been tolerating button plug for past 1-2 weeks per reports.  -probably can decannulate--looked great on exam 9. New onset seizures: Continue Keppra bid.  10. Spasticity/Neuropathy LUE/LLE: see above  -consider baclofen trial also in near future  .  LOS (Days) 1 A FACE TO FACE EVALUATION WAS PERFORMED  Ranelle OysterSWARTZ,Carney Saxton T, MD 05/21/2016 9:56 AM

## 2016-05-21 NOTE — Progress Notes (Signed)
Patient information reviewed and entered into eRehab system by Alexandr Oehler, RN, CRRN, PPS Coordinator.  Information including medical coding and functional independence measure will be reviewed and updated through discharge.    

## 2016-05-21 NOTE — Evaluation (Signed)
Speech Language Pathology Assessment and Plan  Patient Details  Name: Cheryl Gibbs MRN: [id number] Date of Birth: 04/12/1991  SLP Diagnosis: Cognitive Impairments;Speech and Language deficits  Rehab Potential: Good ELOS: 4-6 weeks    Today's Date: 05/21/2016 SLP Individual Time: 5465-0354 SLP Individual Time Calculation (min): 60 min    Problem List: Patient Active Problem List   Diagnosis Date Noted  . Intraparenchymal hematoma of brain ([place]) 05/20/2016  . Global aphasia   . Spastic hemiparesis affecting nondominant side ([place])   . Vascular headache   . Dysphagia, post-stroke   . Tracheostomy status ([place])   . Seizure ([place])   . Neuropathy ([place])   . Nonverbal   . Spastic hemiplegia affecting nondominant side ([facility])    Past Medical History:  Past Medical History:  Diagnosis Date  . ADD (attention deficit disorder)   . Occipital neuralgia    Past Surgical History:  Past Surgical History:  Procedure Laterality Date  . CESAREAN SECTION  03/31/2016    Assessment / Plan / Recommendation Clinical Impression Patient is a 25 year old female who was 10 days post partum and was transferred to [facility] from OSH on 04/11/16 after being found unresponsive in by husband.  History taken from chart review and mother.  Patient in bathtub--not submerged and activity seizing. 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  She underwent cerebral angiogram and decline neurologically shortly after admission to ICU. She was taken to OR emergently for right craniotomy for evacuation of IPH and obliteration of ruptured lenticulostriate aneurysm and placement of EVD. She was place on Keppra for seizure prophylaxis and her hospital course significant for VDRF with sepsis, diffuse vasospasms and difficulty with vent wean  requiring  Trach and PEG on 11/22.  She was transferred to [facility] on 12/8 and has been weaned off vent. She has been tolerating trach plugging and agitation. Constipation resolved with addition of miralax. On continuous tube feeds and remains NPO. She continues to have right frontal HA as well as severe pain LUE/LLE--LLE dopplers negative for DVT on 12/27.  Follow up CT head done due to increase bulging through crani defect and revealed increase in extra axial fluid. She is able to to follow simple commands and write answers due to non-verbal state. Patient with resultant  expressive > receptive deficits, does not make attempts to verbalize, dysphagia, right gaze preference, left sided weaknesses affecting mobility and self-care tasks.  Patient admitted to [facility] 05/20/16.   Patient has a #4 cuffless trach that is currently capped. Patient demonstrated functional communication by utilizing a dry erase board to express her basic wants/needs at the phrase level. Patient demonstrated minor spelling errors but all written expression was 100% legible and intelligible. Patient's auditory comprehension appeared grossly intact indicated by ability to follow 3 step commands and answer yes/no questions with 100% accuracy. patient demonstrated intact reading comprehension at the word level but impairments at the phrase level, question how much visual impairments impacted function. Patient with no attempts to verbally communicate or vocalize despite Max A multimodal cues but demonstrated a hoarse vocal quality when coughing and moaning in pain. Patient indicated she can talk but chooses not to because of "teeth hurting" from "nerve damage." Suspect patient's ability to communicate is impacted by cognition and motor planning but difficulty to differentiate. Therefore, continued diagnostic  treatment is needed. Patient also demonstrated cognitive impairments impacting sustained attention, attention to left field of environment,  awareness, problem solving and recall which impacts her ability to perform functional tasks safely. Patient would benefit from skilled SLP intervention to maximize her cognitive-linguistic function and overall functional independence prior to discharge. BSE will be performed at next session.    Skilled Therapeutic Interventions          Administered a cognitive-linguistic evaluation. Please see above for details. Throughout session, patient required Max A verbal and visual cues to scan to left field of environment to locate clinician. At end of session, patient transferred back to bed due to indications/reports of pain and discomfort. RN made aware.    SLP Assessment  Patient will need skilled [place] [facility] during CIR admission    Recommendations  Oral Care Recommendations: Oral care QID Patient destination: Home Follow up Recommendations: 24 hour supervision/assistance;Outpatient SLP;Home Health SLP Equipment Recommended: To be determined    SLP Frequency 3 to 5 out of 7 days   SLP Duration  SLP Intensity  SLP Treatment/Interventions 4-6 weeks  Minumum of 1-2 x/day, 30 to 90 minutes  Cognitive remediation/compensation;Cueing hierarchy;Environmental controls;Internal/external aids;Functional tasks;Patient/family education;Therapeutic Activities;Speech/Language facilitation;Multimodal communication approach    Pain Pain Assessment Faces Pain Scale: Hurts whole lot Pain Type: Acute pain Pain Location: leg Pain Orientation: Left Pain Intervention(s): RN made aware;Repositioned  Prior Functioning Type of Home: House  Lives With: Family Available Help at Discharge: Family;Available 24 hours/day  Function:  Cognition Comprehension Comprehension assist level: Understands basic 75 - 89% of the time/ requires cueing 10 - 24% of the time  Expression Expression assistive device: Communication board Expression assist level: Expresses basic 50 - 74% of the  time/requires cueing 25 - 49% of the time. Needs to repeat parts of sentences.  Social Interaction Social Interaction assist level: Interacts appropriately 25 - 49% of time - Needs frequent redirection.  Problem Solving Problem solving assist level: Solves basic less than 25% of the time - needs direction nearly all the time or does not effectively solve problems and may need a restraint for safety  Memory Memory assist level: Recognizes or recalls 25 - 49% of the time/requires cueing 50 - 75% of the time   Short Term Goals: Week 1: SLP Short Term Goal 1 (Week 1): Patient will scan to left field of enviornment to locate specific objects, etc with Mod A verbal and visual cues.  SLP Short Term Goal 2 (Week 1): Patient will demonstrate sustained attention to functional tasks for 2 minutes with Mod A verbal cues.  SLP Short Term Goal 3 (Week 1): Patient will vocalize on command in 25% of opportunities with Max A multimodal cues.  SLP Short Term Goal 4 (Week 1): Patient will demonstrate reading comprehension at the phrase level with 75% accuracy with Mod A multimodal cues.   Refer to Care Plan for Long Term Goals  Recommendations for other services: Neuropsych  Discharge Criteria: Patient will be discharged from SLP if patient refuses treatment 3 consecutive times without medical reason, if treatment goals not met, if there is a change in medical status, if patient makes no progress towards goals or if patient is discharged from hospital.  The above assessment, treatment plan, treatment alternatives and goals were discussed and mutually agreed upon: by patient  Hong Moring 05/21/2016, 11:14 AM

## 2016-05-21 NOTE — Evaluation (Signed)
Physical Therapy Assessment and Plan  Patient Details  Name: Cheryl Gibbs MRN: [id number] Date of Birth: 07-19-1990  PT Diagnosis: Abnormal posture, Cognitive deficits, Difficulty walking, Hemiplegia non-dominant, Impaired cognition and Muscle weakness Rehab Potential: Good ELOS: 4-6 weeks   Today's Date: 05/21/2016 PT Individual Time: 0730-0830 PT Individual Time Calculation (min): 60 min     Problem List: Patient Active Problem List   Diagnosis Date Noted  . Intraparenchymal hematoma of brain ([place]) 05/20/2016  . Global aphasia   . Spastic hemiparesis affecting nondominant side ([place])   . Vascular headache   . Dysphagia, post-stroke   . Tracheostomy status ([place])   . Seizure ([place])   . Neuropathy ([place])   . Nonverbal   . Spastic hemiplegia affecting nondominant side ([facility])     Past Medical History:  Past Medical History:  Diagnosis Date  . ADD (attention deficit disorder)   . Occipital neuralgia    Past Surgical History:  Past Surgical History:  Procedure Laterality Date  . CESAREAN SECTION  03/31/2016    Assessment & Plan Clinical Impression: Patient is a 25 y.o. year old female with transferred to [facility] from OSH on 04/11/16  after being found unresponsive in by husband.  History taken from chart review and mother.  Patient in bathtub--not submerged and activity seizing. 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  She underwent cerebral angiogram and decline neurologically shortly after admission to ICU. She was taken to OR emergently for right craniotomy for evacuation of IPH and obliteration of ruptured lenticulostriate aneurysm and placement of EVD. She was place on Keppra for seizure prophylaxis and her hospital course significant for VDRF with sepsis, diffuse vasospasms and difficulty with  vent wean requiring  Trach and PEG on 11/22..  Patient transferred to CIR on 05/20/2016 .   Patient currently requires total with mobility secondary to muscle weakness, impaired timing and sequencing, abnormal tone, unbalanced muscle activation, motor apraxia, decreased coordination and decreased motor planning, decreased attention, decreased awareness, decreased safety awareness and delayed processing and decreased sitting balance, decreased standing balance, decreased postural control, hemiplegia and decreased balance strategies.  Prior to hospitalization, patient was independent  with mobility and lived with Family in a House home.  Home access is  Level entry.  Patient will benefit from skilled PT intervention to maximize safe functional mobility, minimize fall risk and decrease caregiver burden for planned discharge home with 24 hour assist.  Anticipate patient will benefit from follow up [facility] at discharge.  PT - End of Session Activity Tolerance: Tolerates 30+ min activity with multiple rests Endurance Deficit: Yes PT Assessment Rehab Potential (ACUTE/IP ONLY): Good PT Patient demonstrates impairments in the following area(s): Motor;Pain;Balance;Endurance;Sensory;Safety PT Transfers Functional Problem(s): Bed Mobility;Bed to Chair;Car;Furniture PT Locomotion Functional Problem(s): Ambulation;Wheelchair Mobility;Stairs PT Plan PT Intensity: Minimum of 1-2 x/day ,45 to 90 minutes PT Frequency: 5 out of 7 days PT Duration Estimated Length of Stay: 4-6 weeks PT Treatment/Interventions: Ambulation/gait training;Cognitive remediation/compensation;Discharge planning;DME/adaptive equipment instruction;Functional mobility training;Pain management;Splinting/orthotics;Therapeutic Activities;UE/LE Strength taining/ROM;Wheelchair propulsion/positioning;UE/LE Coordination activities;Therapeutic Exercise;Stair training;Patient/family education;Neuromuscular re-education;Functional electrical  stimulation;Community reintegration;Balance/vestibular training PT Transfers Anticipated Outcome(s): min A PT Locomotion Anticipated Outcome(s): min A gait, supervision w/c level PT Recommendation Follow Up Recommendations: Home health PT Patient destination: Home Equipment Recommended: To be determined  Skilled Therapeutic Intervention Pt performed rolling in bed with +2  assist, pt able to grab bed rail and assist with pulling with Rt UE, painful Lt side with rolling.  Supine to sit with +2 assist, pt with difficulty assisting. Pt requires mod/max A to sit edge of bed due to posterior lean.  Bed <>w/c transfer with +2, pt able to grab armrest and assist 5-10% with transfer, pt with little use of LEs for transfer.  Sit to stand in stedy x 2 with +2 assist and pt using Rt UE to assist. Pt's Lt LE with increased extension and supination of ankle, pt reports Lt LE pain with standing which eases with improved positioning.  Pt able to communicate pain and able to nod yes to questions. Able to write responses and follow 1-2 step commands. Non verbal. Pt left in w/c with quick release belt in place at nursing station.  PT Evaluation Precautions/Restrictions Precautions Precautions: Fall Precaution Comments: helmet when OOB Restrictions Weight Bearing Restrictions: No Pain Pain Assessment Pain Assessment: Faces Pain Score: 6  Faces Pain Scale: Hurts whole lot Pain Type: Acute pain Pain Location: Arm Pain Orientation: Left Pain Intervention(s): RN made aware;Repositioned Home Living/Prior Functioning Home Living Available Help at Discharge: Family;Available 24 hours/day Type of Home: House Home Access: Level entry Home Layout: One level  Lives With: Family Prior Function Level of Independence: Independent with transfers;Independent with gait  Able to Take Stairs?: Yes Driving: Yes  Cognition Overall Cognitive Status: Impaired/Different from baseline Orientation Level: Oriented to  situation;Oriented to person Attention: Sustained Sustained Attention: Impaired Awareness: Impaired Awareness Impairment: Emergent impairment Safety/Judgment: Impaired Sensation Sensation Light Touch: Impaired Detail Light Touch Impaired Details: Impaired LUE;Impaired LLE Proprioception: Impaired Detail Proprioception Impaired Details: Impaired LUE;Impaired LLE Coordination Gross Motor Movements are Fluid and Coordinated: No Fine Motor Movements are Fluid and Coordinated: No Motor  Motor Motor: Hemiplegia;Abnormal tone;Motor apraxia Motor - Skilled Clinical Observations: Lt sided weakness, some increased tone Lt LE   Trunk/Postural Assessment  Cervical Assessment Cervical Assessment: Within Functional Limits Thoracic Assessment Thoracic Assessment: Within Functional Limits Lumbar Assessment Lumbar Assessment:  (posterior pelvic tilt) Postural Control Postural Control: Deficits on evaluation Righting Reactions: delayed  Balance Balance Balance Assessed: Yes Static Sitting Balance Static Sitting - Comment/# of Minutes: mod/max A for sitting EOB Static Standing Balance Static Standing - Comment/# of Minutes: +2 assist to stand in stedy Extremity Assessment      RLE Assessment RLE Assessment: Within Functional Limits LLE Assessment LLE Assessment:  (no palpable movement, increased supination of foot)   See Function Navigator for Current Functional Status.   Refer to Care Plan for Long Term Goals  Recommendations for other services: None   Discharge Criteria: Patient will be discharged from PT if patient refuses treatment 3 consecutive times without medical reason, if treatment goals not met, if there is a change in medical status, if patient makes no progress towards goals or if patient is discharged from hospital.  The above assessment, treatment plan, treatment alternatives and goals were discussed and mutually agreed upon: by patient and by  family  Cheryl Gibbs 05/21/2016, 8:31 AM

## 2016-05-21 NOTE — Progress Notes (Signed)
*  PRELIMINARY RESULTS* Vascular Ultrasound Right lower extremity venous duplex has been completed.  Preliminary findings: No evidence of DVT or baker's cyst.   Farrel DemarkJill Eunice, RDMS, RVT  05/21/2016, 10:45 AM

## 2016-05-21 NOTE — Evaluation (Signed)
Occupational Therapy Assessment and Plan  Patient Details  Name: Cheryl Gibbs MRN: [id number] Date of Birth: [birth year]  OT Diagnosis: acute pain, cognitive deficits, disturbance of vision, hemiplegia affecting non-dominant side and muscle weakness (generalized) Rehab Potential: Rehab Potential (ACUTE ONLY): Fair ELOS: 24-26 days   Today's Date: 05/21/2016 OT Individual Time: 1100-1205 OT Individual Time Calculation (min): 65 min      Problem List: Patient Active Problem List   Diagnosis Date Noted  . Intraparenchymal hematoma of brain ([place]) 05/20/2016  . Global aphasia   . Spastic hemiparesis affecting nondominant side ([place])   . Vascular headache   . Dysphagia, post-stroke   . Tracheostomy status ([place])   . Seizure ([place])   . Neuropathy ([place])   . Nonverbal   . Spastic hemiplegia affecting nondominant side ([facility])     Past Medical History:  Past Medical History:  Diagnosis Date  . ADD (attention deficit disorder)   . Occipital neuralgia    Past Surgical History:  Past Surgical History:  Procedure Laterality Date  . CESAREAN SECTION  03/31/2016    Assessment & Plan Clinical Impression: Cheryl Gibbs is a 25 year old female who was 10 days post partum and  was transferred to [facility] from OSH on 04/11/16  after being found unresponsive in by husband.  History taken from chart review and mother.  Patient in bathtub--not submerged and activity seizing. 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  She underwent cerebral angiogram and decline neurologically shortly after admission to ICU. She was taken to OR emergently for right craniotomy for evacuation of IPH and obliteration of ruptured lenticulostriate aneurysm and placement of EVD. She was place on Keppra for seizure prophylaxis and her hospital course  significant for VDRF with sepsis, diffuse vasospasms and difficulty with vent wean requiring  Trach and PEG on 11/22.     She was transferred to [facility] on 12/8 and has been weaned off vent. She has been tolerating trach plugging and agitation. Constipation resolved with addition of miralax. On continuous tube feeds and remains NPO. She continues to have right frontal HA as well as severe pain LUE/LLE--LLE dopplers negative for DVT on 12/27.  Follow up CT head done due to increase bulging through crani defect and revealed increase in extra axial fluid. She is able to to follow simple commands and write answers due to non-verbal state. Patient with resultant  expressive > receptive deficits, does not make attempts to verbalize, dysphagia, right gaze preference, left sided weaknesses affecting mobility and self care tasks  Patient transferred to CIR on 05/20/2016 .    Patient currently requires total with basic self-care skills secondary to muscle weakness, abnormal tone, decreased visual perceptual skills, decreased visual motor skills and hemianopsia, decreased attention to left, decreased attention, decreased problem solving and decreased memory and decreased sitting balance, decreased postural control and hemiplegia.  Prior to hospitalization, patient was fully independent.  Patient will benefit from skilled intervention to increase independence with basic self-care skills prior to discharge home with care partner.  Anticipate patient will require 24 hour supervision and moderate physical assestance and follow up home health.  OT - End of Session Activity Tolerance: Tolerates < 10 min activity, no significant change in vital signs Endurance Deficit: Yes OT Assessment Rehab Potential (ACUTE ONLY): Fair OT Patient demonstrates impairments in  the following area(s): Balance;Cognition;Endurance;Motor;Perception;Sensory;Vision OT Basic ADL's Functional Problem(s):  Grooming;Bathing;Dressing;Toileting;Eating OT Transfers Functional Problem(s): Toilet;Tub/Shower OT Additional Impairment(s): Fuctional Use of Upper Extremity OT Plan OT Intensity: Minimum of 1-2 x/day, 45 to 90 minutes OT Frequency: 5 out of 7 days OT Duration/Estimated Length of Stay: 24-26 days OT Treatment/Interventions: Balance/vestibular training;Cognitive remediation/compensation;DME/adaptive equipment instruction;Discharge planning;Functional mobility training;Neuromuscular re-education;Psychosocial support;Patient/family education;Self Care/advanced ADL retraining;Pain management;UE/LE Strength taining/ROM;Therapeutic Exercise;Therapeutic Activities;UE/LE Coordination activities;Visual/perceptual remediation/compensation OT Self Feeding Anticipated Outcome(s): supervision if no longer NPO OT Basic Self-Care Anticipated Outcome(s): mod A OT Toileting Anticipated Outcome(s): max A OT Bathroom Transfers Anticipated Outcome(s): mod A  OT Recommendation Recommendations for Other Services: Neuropsych consult;Therapeutic Recreation consult Therapeutic Recreation Interventions: Other (comment) Patient destination: Home Follow Up Recommendations: Home health OT Equipment Recommended: Tub/shower bench;3 in 1 bedside comode   Skilled Therapeutic Intervention Pt seen for initial evaluation and initiation of ADL training with a focus on pain tolerance/management, attention, trunk control. Pt received in bed. +2 total A needed for all mobility including to EOB, squat pivot transfer, sit to stand to don pants.  Pt is in severe pain (primarily in L arm, but also neck) with all movement and is not able to actively use R side to assist.  She is able to follow 1 step commands with additional processing time and answer questions with communication board with 1 -3 word answers.  She does understand where she is and is oriented to year/month.  She needed max cues to reattend to a task and to find items on  her L side.  Total A to maintain balance EOB with pt pushing back.  Pt crying frequently from pain with numerous short rest breaks.  By end of session, pt calm and resting in tilt in space w/c with quick release belt on.    OT Evaluation Precautions/Restrictions  Restrictions Weight Bearing Restrictions: No     Pain Pain Assessment Pain Assessment: Faces Pain Score: 10-Worst pain ever Pain Type: Acute pain;Neuropathic pain Pain Location: Arm Pain Orientation: Left Pain Descriptors / Indicators: Grimacing;Crying Pain Onset: Other (Comment) (with light touch) Pain Intervention(s): Rest Home Living/Prior Functioning Home Living Available Help at Discharge: Family, Available 24 hours/day Type of Home: House Home Access: Level entry Home Layout: One level Bathroom Shower/Tub: Tub/shower unit Additional Comments: Husband has deployed to [employer], [place]  Lives With: Family Prior Function Level of Independence: Independent with transfers, Independent with gait  Able to Take Stairs?: Yes Driving: Yes Vocation: Part time employment ADL ADL ADL Comments: refer to navigator Vision/Perception  Vision- History Baseline Vision/History: No visual deficits Patient Visual Report:  (unable to clearly state with communication board) Vision- Assessment Vision Assessment?: Yes Eye Alignment: Within Functional Limits Ocular Range of Motion: Restricted on the left Alignment/Gaze Preference: Gaze right Tracking/Visual Pursuits: Impaired - to be further tested in functional context Visual Fields: Impaired-to be further tested in functional context Praxis Praxis-Other Comments: able to follow one step directions to wash face, brush teeth using RUE  Cognition Overall Cognitive Status: Impaired/Different from baseline Arousal/Alertness: Awake/alert Orientation Level: Person;Place (expressed with communication board) Year: 2017 Month: December Day of Week: Other (Comment) (did not  assess) Memory: Impaired Memory Impairment: Decreased recall of new information Immediate Memory Recall: Sock;Blue;Bed Memory Recall: Sock;Blue;Bed Memory Recall Sock: Without Cue Memory Recall Blue: Without Cue Memory Recall Bed: With Cue Attention: Sustained Sustained Attention: Impaired Sustained Attention Impairment: Functional basic Awareness: Impaired Awareness Impairment: Emergent impairment Problem Solving: Impaired Problem Solving Impairment: Functional basic Safety/Judgment: Impaired Sensation Sensation Stereognosis:  Impaired by gross assessment Hot/Cold: Impaired by gross assessment Additional Comments: LUE highly sensitive to touch Coordination Finger Nose Finger Test: LUE non functional Motor  Motor Motor: Hemiplegia;Abnormal tone;Motor apraxia Motor - Skilled Clinical Observations: Increased tone on LUE/LLE Mobility    +2 A with all mobility Trunk/Postural Assessment   posterior lean, delayed righting reactions Balance Dynamic Sitting Balance Sitting balance - Comments: total A Extremity/Trunk Assessment RUE Assessment RUE Assessment: Exceptions to [facility] RUE Strength RUE Overall Strength Comments: AROM WFL, 4-/5 strength LUE Assessment LUE Assessment: Exceptions to WFL LUE AROM (degrees) LUE Overall AROM Comments: O AROM LUE Tone LUE Tone: Moderate (tone reflex with touch)   See Function Navigator for Current Functional Status.   Refer to Care Plan for Long Term Goals  Recommendations for other services: Neuropsych and Therapeutic Recreation  Other relaxation    Discharge Criteria: Patient will be discharged from OT if patient refuses treatment 3 consecutive times without medical reason, if treatment goals not met, if there is a change in medical status, if patient makes no progress towards goals or if patient is discharged from hospital.  The above assessment, treatment plan, treatment alternatives and goals were discussed and mutually agreed upon:  No family available/patient unable  [facility] 05/21/2016, 12:31 PM

## 2016-05-21 NOTE — Plan of Care (Signed)
Problem: RH BLADDER ELIMINATION Goal: RH STG MANAGE BLADDER WITH ASSISTANCE STG Manage Bladder With Assistance  Outcome: Not Progressing C/o burning and frequency

## 2016-05-21 NOTE — Progress Notes (Signed)
Initial Nutrition Assessment  DOCUMENTATION CODES:   Obesity unspecified  INTERVENTION:  Stop continuous tube feeds.   At 1800, start bolus tube feeds via PEG using Osmolite 1.5 formula at volume of 50 ml and increase by 50 ml every 6 hours to goal volume of 300 ml given 4 times daily with 30 ml Prostat once daily per tube.   Tube feeding regimen will provide 1900 kcal (100% of needs), 90 grams of protein, and 912 ml of free water.   Increase free water flushes to 200 ml QID given between boluses. Total free water: 1712 ml.  RD to continue to monitor.   NUTRITION DIAGNOSIS:   Inadequate oral intake related to inability to eat as evidenced by NPO status.  GOAL:   Patient will meet greater than or equal to 90% of their needs  MONITOR:   TF tolerance, Labs, Weight trends, Skin, I & O's  REASON FOR ASSESSMENT:   Consult Enteral/tube feeding initiation and management  ASSESSMENT:   25 year old female who was 10 days post partum and  was transferred to [facility] from OSH on 04/11/16  after being found unresponsive by husband.  Patient in bathtub--not submerged and activity seizing. 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  She underwent cerebral angiogram and decline neurologically shortly after admission to ICU. She was taken to OR emergently for right craniotomy for evacuation of IPH and obliteration of ruptured lenticulostriate aneurysm and placement of EVD. Trach and PEG were placed on 11/22.  [facility] on 05/01/16. She is non verbal.  Pt able to nod or shake head to questions asked. Pt also communicates via white board. Pt has been tolerating her tube feeds with no other difficulties. RD consulted for transitioning to bolus feeds. Discussed with RN  regarding new orders. No family at bedside. Usual body weight unknown. Pt with no observed significant fat or muscle mass loss. RD to continue to monitor.   Labs and medications reviewed.   Diet Order:  Diet NPO time specified  Skin:   (Incision on head R)  Last BM:  12/26  Height:   Ht Readings from Last 1 Encounters:  05/20/16 5\' 4"  (1.626 m)    Weight:   Wt Readings from Last 1 Encounters:  05/20/16 186 lb 12.8 oz (84.7 kg)    Ideal Body Weight:  54.5 kg  BMI:  Body mass index is 32.06 kg/m.  Estimated Nutritional Needs:   Kcal:  1850-2050  Protein:  90-105 grams  Fluid:  1.8 - 2 L/day  EDUCATION NEEDS:   No education needs identified at this time  Roslyn SmilingStephanie Shayne Deerman, MS, RD, LDN Pager # [phone] After hours/ weekend pager # [phone]

## 2016-05-21 NOTE — Progress Notes (Signed)
Ankit Lorie Phenix, MD Physician Signed Physical Medicine and Rehabilitation  PMR Pre-admission Date of Service: 05/20/2016 12:11 PM  Related encounter: Admission (Discharged) from 05/01/2016 in [facility]       '[]' Hide copied text   [employer] PMR Admission Coordinator Pre-Admission Assessment  Patient: Cheryl Gibbs is an 25 y.o., female MRN: [id number] DOB: 1991/04/11 Height: '5\' 3"'  (160 cm) Weight: 84.4 kg (186 lb 1.6 oz)  Insurance Information HMO:    PPO:      PCP:      IPA:      80/20:      OTHER: POS 2 plan PRIMARY: [employer] choice      Policy#: [id number]      Subscriber: Patient CM Name: Hezzie Bump      Phone#: [phone]     Fax#: [phone] Pre-Cert#: [id number] from 05/20/16 to 05/29/16 with updates due 05/28/16    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 Date: 09/02/15     Deduct:  $700 (all met)      Out of Pocket Max: $4000 (all met)      Life Max: unlimited CIR: 80%      SNF: 80% Outpatient: 80% with medical necessity     Co-Pay: 20% Home Health: 80%      Co-Pay: 20% DME: 80%     Co-Pay: 20% Providers: in network  Medicaid Application Date:        Case Manager:   Disability Application Date:        Case Worker:    Emergency Contact Information        Contact Information    Name Relation Home Work Mobile   [place] Mother  [phone]   Laws,Jacob Spouse [phone]       Current Medical History  Patient Admitting Diagnosis:  R frontal IPH/IVH with right craniotomy  History of Present Illness: A 25 year old female who was 10 days post partum and was transferred to [facility] from OSH on 04/11/16 after being found unresponsive by husband. Patient in bathtub--not submerged and activity seizing. She was intubated in ED for airway protection and CT head done revealing large acute IPH in right frontal lobe with vasogenic edema and 1.4 cm right to left mid-line  shift, extensive IVH with suggestion of developing hydrocephalus. She underwent cerebral angiogram and decline neurologically shortly after admission to ICU. She was taken to OR emergently for right craniotomy for evacuation of IPH and obliteration of ruptured lenticulostriate aneurysm and placement of EVD. She was placed on Keppra for seizure prophylaxis and her hospital course was significant for VDRF with sepsis, diffuse vasospasms and difficulty with vent wean requiring Trach and PEG were placed on 11/22.  She was admitted to [facility]) on 05/01/16.  She has been receiving and participating with PT/OT/SLP and is making progress.  Patient has L inattention.  She has been using a wipe board to write to communicate.  She is non verbal.  She is following commands.  She has occasional crying spells.  She continues to have pain in her left arm and in both legs.  She is not requiring suctioning of her trach at this time.   Patient's medical record from  [facility] has been reviewed by the rehabilitation admission coordinator and physician.  Past Medical History      Past Medical History:  Diagnosis Date  . ADD (attention deficit disorder)   .  Occipital neuralgia     Family History   family history is not on file.  Prior Rehab/Hospitalizations Has the patient had major surgery during 100 days prior to admission? Yes.  Underwent a C-section on 03/31/16.         Current Medications  See MAR from [facility]  Patients Current Diet:   NPO with tube feedings  Precautions / Restrictions Precautions Precautions: Fall Precautions/Special Needs: Swallowing   Has the patient had 2 or more falls or a fall with injury in the past year?No.  Mom reports 1 fall where patient slipped during pregnancy with no injury.  Prior Activity Level Community (5-7x/wk): Was active.  Worked until 80th month of pregnancy.  Delivered baby 03/31/16.  Prior  Functional Level Self Care: Did the patient need help bathing, dressing, using the toilet or eating?  Independent  Indoor Mobility: Did the patient need assistance with walking from room to room (with or without device)? Independent  Stairs: Did the patient need assistance with internal or external stairs (with or without device)? Independent  Functional Cognition: Did the patient need help planning regular tasks such as shopping or remembering to take medications? Independent  Home Assistive Devices / Equipment    Prior Device Use: Indicate devices/aids used by the patient prior to current illness, exacerbation or injury? None   Prior Functional Level Current Functional Level  Bed Mobility Independent Max assist  Transfers Independent Max assist  Mobility - Walk/Wheelchair Independent Max assist  Upper Body Dressing Independent Total assist  Lower Body Dressing Independent Total assist  Grooming Independent Total assist  Eating/Drinking Independent Other (Currently NPO with PEG tube feedings)  Toilet Transfer Independent Max assist  Bladder Continence  WDL Incontinence, uses bedpan inconsistently  Bowel Management WDL Last BM 05/19/16 with incontinence  Stair Climbing Independent Total assist  Communication Intact Non verbal.  Writes on a wipe board.  Does follow commands.  Memory Intact Impaired  Cooking/Meal Prep Independent     Housework Independent   Money Management Independent   Driving  Independent     Special needs/care consideration BiPAP/CPAP No CPM No Continuous Drip IV No Dialysis No       Life Vest No Oxygen No Special Bed No Trach Size Yes #4 shiley cuffless capped trach Wound Vac (area) No      Skin Has PEG tube site and trach stoma site                            Bowel mgmt: Last BM 05/19/16 with incontinence Bladder mgmt: Voiding with incontinence and sometimes on bedpan Diabetic mgmt No  Previous Home Environment Living Arrangements:  Spouse/significant other, Children  Lives With: Spouse, Family Available Help at Discharge: Family, Available 24 hours/day Type of Home: House (Was a rental home.  Gave up rental home.) Additional Comments: Husband has deployed to [facility], [place]  Discharge Living Setting Plans for Discharge Living Setting: House, Lives with (comment) (Plans home with mom.) Type of Home at Discharge: House Discharge Home Layout: One level Discharge Home Access: Level entry (Level entry at garage.) Does the patient have any problems obtaining your medications?: No  Social/Family/Support Systems Patient Roles: Spouse, Parent, Other (Comment) (Has a husband, mom and 3 children.) Contact Information: Earleen Aoun - mom Anticipated Caregiver: mom and extended family Anticipated Caregiver's Contact Information: (c) [phone] (w) [phone]  Ability/Limitations of Caregiver: Mom works but she can work from home.  Has large exteded  family closeby who can assist. Caregiver Availability: 24/7 Discharge Plan Discussed with Primary Caregiver: Yes Is Caregiver In Agreement with Plan?: Yes Does Caregiver/Family have Issues with Lodging/Transportation while Pt is in Rehab?: No  Goals/Additional Needs Patient/Family Goal for Rehab: Hopeful for supervision to min assist, but may be min to mod assist goals Expected length of stay: 3-4 weeks (may need 4.5 weeks) Cultural Considerations: Christian Dietary Needs: NPO with tube feedings Equipment Needs: TBD Additional Information: Newborn baby on 03/31/16, patient's second child.  Has a 59 yo her husband's child, a 58 yo of her own, and the newborn.  Children are with husband's grandmother. Pt/Family Agrees to Admission and willing to participate: Yes Program Orientation Provided & Reviewed with Pt/Caregiver Including Roles  & Responsibilities: Yes  Patient Condition:  I met with patient and her mother at [facility] and evaluated patient.  Patient is  postpartum 03/31/16 with R ICH with craniotomy.  Patient has been receiving PT/OT/SLP on [facility].  Patient can tolerate and benefit from 3 hours of therapy a day.  Patient needs the coordinated team approach of the rehab unit in order to make significant functional gains.  The goal is home with mom providing care at the end of inpatient rehab stay.  Preadmission Screen Completed By:  Retta Diones, 05/20/2016 12:43 PM ______________________________________________________________________   Discussed status with Dr. Posey Pronto on 05/20/16 at 1243 and received telephone approval for admission today.  Admission Coordinator:  Retta Diones, time 1243/Date 05/20/16   Assessment/Plan: Diagnosis: R frontal IPH/IVH with right craniotomy  1. Does the need for close, 24 hr/day  Medical supervision in concert with the patient's rehab needs make it unreasonable for this patient to be served in a less intensive setting? Yes 2. Co-Morbidities requiring supervision/potential complications: aphasia, +trach, +PEG, seizures 3. Due to bladder management, bowel management, safety, skin/wound care, disease management, medication administration, pain management and patient education, does the patient require 24 hr/day rehab nursing? Yes 4. Does the patient require coordinated care of a physician, rehab nurse, PT (1-2 hrs/day, 5 days/week), OT (1-2 hrs/day, 5 days/week) and SLP (1-2 hrs/day, 5 days/week) to address physical and functional deficits in the context of the above medical diagnosis(es)? Yes Addressing deficits in the following areas: balance, endurance, locomotion, strength, transferring, bowel/bladder control, bathing, dressing, feeding, grooming, toileting, cognition, speech, language, swallowing and psychosocial support 5. Can the patient actively participate in an intensive therapy program of at least 3 hrs of therapy 5 days a week? Yes 6. The potential for patient to make measurable  gains while on inpatient rehab is excellent 7. Anticipated functional outcomes upon discharge from inpatients are: min assist and mod assist PT, min assist and mod assist OT, min assist and mod assist SLP 8. Estimated rehab length of stay to reach the above functional goals is: 22-26 days. 9. Does the patient have adequate social supports to accommodate these discharge functional goals? Yes 10. Anticipated D/C setting: Home 11. Anticipated post D/C treatments: HH therapy and Home excercise program 12. Overall Rehab/Functional Prognosis: good and fair    RECOMMENDATIONS: This patient's condition is appropriate for continued rehabilitative care in the following setting: CIR Patient has agreed to participate in recommended program. Yes Note that insurance prior authorization may be required for reimbursement for recommended care.  Retta Diones 05/20/2016  Delice Lesch, MD, [place]    Revision History

## 2016-05-21 NOTE — Progress Notes (Signed)
Occupational Therapy Note  Patient Details  Name: Cheryl Gibbs MRN: [id number] Date of Birth: 03/03/1991  Today's Date: 05/21/2016 OT Individual Time: 1330-1400 OT Individual Time Calculation (min): 30 min    Pt indicated that she had a headache; RN notified Individual Therapy  Pt resting in TIS w/c upon arrival.  Increased tone noted in LUE.  Stretching performed for elbow and hand. Pt noted with some grimacing with stretching but did not indicate for therapist to stop.  Pt nonverbal during session and did not attempt to communicate with white board when asked questions.  Pt remained in TIS w/c with all needs within reach.   Lavone NeriLanier, [name] [name] 05/21/2016, 2:54 PM

## 2016-05-22 ENCOUNTER — Inpatient Hospital Stay (HOSPITAL_COMMUNITY): Payer: Managed Care, Other (non HMO) | Admitting: Occupational Therapy

## 2016-05-22 ENCOUNTER — Inpatient Hospital Stay (HOSPITAL_COMMUNITY): Payer: Managed Care, Other (non HMO) | Admitting: Physical Therapy

## 2016-05-22 ENCOUNTER — Inpatient Hospital Stay (HOSPITAL_COMMUNITY): Payer: Managed Care, Other (non HMO) | Admitting: Speech Pathology

## 2016-05-22 LAB — GLUCOSE, CAPILLARY
Glucose-Capillary: 102 mg/dL — ABNORMAL HIGH (ref 65–99)
Glucose-Capillary: 119 mg/dL — ABNORMAL HIGH (ref 65–99)
Glucose-Capillary: 132 mg/dL — ABNORMAL HIGH (ref 65–99)
Glucose-Capillary: 87 mg/dL (ref 65–99)

## 2016-05-22 LAB — HEMOGLOBIN A1C
Hgb A1c MFr Bld: 4.8 % (ref 4.8–5.6)
Mean Plasma Glucose: 91 mg/dL

## 2016-05-22 MED ORDER — BACLOFEN 5 MG HALF TABLET
5.0000 mg | ORAL_TABLET | Freq: Two times a day (BID) | ORAL | Status: DC
  Administered 2016-05-22 – 2016-05-26 (×9): 5 mg via ORAL
  Filled 2016-05-22 (×9): qty 1

## 2016-05-22 MED ORDER — CIPROFLOXACIN HCL 250 MG PO TABS
250.0000 mg | ORAL_TABLET | Freq: Two times a day (BID) | ORAL | Status: AC
  Administered 2016-05-22 – 2016-05-28 (×14): 250 mg
  Filled 2016-05-22 (×14): qty 1

## 2016-05-22 NOTE — Evaluation (Signed)
Speech Language Pathology Bedside Swallow Evaluation & Session Note  Patient Details  Name: Cheryl Gibbs MRN: [id number] Date of Birth: 28-Feb-1991  SLP Diagnosis: Dysphagia  Rehab Potential: Good ELOS: 4-6 weeks    Today's Date: 05/22/2016 SLP Individual Time: 3570-1779 SLP Individual Time Calculation (min): 75 min    Problem List: Patient Active Problem List   Diagnosis Date Noted  . Hemorrhage, intracranial ([place]) 05/20/2016  . Global aphasia   . Spastic hemiparesis affecting nondominant side ([place])   . Vascular headache   . Dysphagia, post-stroke   . Tracheostomy status ([place])   . Seizure ([place])   . Neuropathy ([place])   . Nonverbal    Past Medical History:  Past Medical History:  Diagnosis Date  . ADD (attention deficit disorder)   . Occipital neuralgia    Past Surgical History:  Past Surgical History:  Procedure Laterality Date  . CESAREAN SECTION  03/31/2016    Assessment / Plan / Recommendation Clinical Impression Patient performed oral care via the suction toothbrush with set-up assist and Min A verbal cues for problem solving. Patient consumed minimal trials due to indications of sensitivity of her oral cavity, in particular her teeth. Patient with minimal labial seal around spoon with immediate expectoration of ice chip after grimacing. Patient declined further trials. However, patient able to initiate a swallow on command in 100% opportunities with saliva. Will attempt more trials at next session. Recommend patient remain NPO at this time.    Skilled Therapeutic Interventions          Patient administered a BSE. Please see above for details. Patient utilized written expression to express wants/needs and indicated she needed to have a bowel movement. Patient also indicated she wanted to use the commode and declined the bedpan. Patient was transferred to the toilet with +2 total A with assistance from OT. Patient limited by pain and pushing while on commode with initial  crying out in pain (in back per patient report) that was eliminated when repositioned. Patient able to successfully void on the commode. Patient was transferred from the toilet to the bed via the [facility] to maximize patient's comfort and overall safety. Patient left supine in bed with all needs within reach. Continue with current plan of care.    SLP Assessment  Patient will need skilled [facility] during CIR admission    Recommendations  SLP Diet Recommendations: NPO;Alternative means - long-term Oral Care Recommendations: Oral care QID Patient destination: Home Follow up Recommendations: 24 hour supervision/assistance;Outpatient SLP;Home Health SLP Equipment Recommended: To be determined    SLP Frequency 3 to 5 out of 7 days   SLP Duration  SLP Intensity  SLP Treatment/Interventions 4-6 weeks  Minumum of 1-2 x/day, 30 to 90 minutes  Functional tasks;Patient/family education;Cueing hierarchy;Dysphagia/aspiration precaution training;Therapeutic Activities    Pain Pain Assessment Pain Assessment: Faces Faces Pain Scale: Hurts even more Pain Type: Acute pain Pain Location: Head Pain Orientation: Right Pain Descriptors / Indicators: Grimacing;Guarding Pain Frequency: Intermittent Pain Intervention(s): Medication (See eMAR)   Function:  Eating Eating   Modified Consistency Diet: Yes Eating Assist Level: Helper feeds patient           Cognition Comprehension Comprehension assist level: Understands basic 50 - 74% of the time/ requires cueing 25 - 49% of the time  Expression Expression assistive device: Communication board Expression assist level: Expresses basic 50 - 74% of the time/requires cueing 25 - 49% of the time. Needs to repeat parts of sentences.  Social Interaction  Social Interaction assist level: Interacts appropriately 50 - 74% of the time - May be physically or verbally inappropriate.  Problem Solving Problem solving assist level:  Solves basic less than 25% of the time - needs direction nearly all the time or does not effectively solve problems and may need a restraint for safety  Memory Memory assist level: Recognizes or recalls 25 - 49% of the time/requires cueing 50 - 75% of the time   Short Term Goals: Week 1: SLP Short Term Goal 1 (Week 1): Patient will scan to left field of enviornment to locate specific objects, etc with Mod A verbal and visual cues.  SLP Short Term Goal 2 (Week 1): Patient will demonstrate sustained attention to functional tasks for 2 minutes with Mod A verbal cues.  SLP Short Term Goal 3 (Week 1): Patient will vocalize on command in 25% of opportunities with Max A multimodal cues.  SLP Short Term Goal 4 (Week 1): Patient will demonstrate reading comprehension at the phrase level with 75% accuracy with Mod A multimodal cues.  SLP Short Term Goal 5 (Week 1): Patient will consume PO trials with minimal overt s/s of aspiration and initiate a swallow response in 100% of trials with Min A verbal cues over 2 sessions to assess readiness for an objective swallow study.   Refer to Care Plan for Long Term Goals  Recommendations for other services: None   Discharge Criteria: Patient will be discharged from SLP if patient refuses treatment 3 consecutive times without medical reason, if treatment goals not met, if there is a change in medical status, if patient makes no progress towards goals or if patient is discharged from hospital.  The above assessment, treatment plan, treatment alternatives and goals were discussed and mutually agreed upon: by patient  Keilah Lemire 05/22/2016, 3:03 PM

## 2016-05-22 NOTE — Progress Notes (Signed)
Occupational Therapy Session Note  Patient Details  Name: [name] MRN: [id number] Date of Birth: 06/16/1990  Today's Date: 05/22/2016 OT Individual Time: 4742-59560830-0930 OT Individual Time Calculation (min): 60 min     Short Term Goals:Week 1:  OT Short Term Goal 1 (Week 1): Pt will be able to sit at EOB for 5 minutes with mod A to maintain balance. OT Short Term Goal 2 (Week 1): Pt will tolerate self bathing her LUE. OT Short Term Goal 3 (Week 1): Pt will don a shirt with mod A. OT Short Term Goal 4 (Week 1): Pt will sit to stand with 1 person A with max A. OT Short Term Goal 5 (Week 1): Pt will use R hand for self ROM of L fingers.  Skilled Therapeutic Interventions/Progress Updates:    Pt seen this session with +2 A to facilitate functional mobility, tolerance to movement, self initiation, righting reactions with bed mobility, sitting balance for ADL retraining. Pt positioned in bed in chair position so she was fully upright to engage in UB bathing/dressing with max A.  Pt was able to tolerate more touch on LUE and slightly more passive movement than yesterday.  She continues to c/o pain in LLE with grimacing/crying when it is flexed. Pt needed brief changed, worked on rolling with pt supporting L arm with R arm to change LB clothing/clean up.    Sat to EOB with total A and pt crying with movement. She did calm down and worked on sit balance at EOB. Pt was able to initiate forward reach and lean but could not engage muscles to prevent posterior lean or fall to the side, needed total A to regain balance. Pt with L ankle inversion from instability. PA contacted and aircast ordered for ankle.  Total +2 transfer to W/c with 4 scoots needed as pt not able to push up with R leg. Pt crying, but calmed down with rest. Pt positioned in w/c with LUE support, quick release belt on.  Speech therapist arrived for next session.    Therapy Documentation Precautions:  Precautions Precautions:  Fall Precaution Comments: helmet when OOB Restrictions Weight Bearing Restrictions: No    Vital Signs: Therapy Vitals Temp: 98.4 F (36.9 C) Temp Source: Oral Pulse Rate: 78 Resp: 18 BP: 108/71 Patient Position (if appropriate): Lying Oxygen Therapy SpO2: 99 % O2 Device: Not Delivered FiO2 (%): 21 % Pain: Pain Assessment Pain Assessment: Faces Faces Pain Scale: Hurts whole lot Pain Type: Acute pain Pain Location: Leg Pain Orientation: Left Pain Descriptors / Indicators: Grimacing;Crying Pain Onset: With Activity Pain Intervention(s): Rest;Repositioned ADL: ADL ADL Comments: refer to navigator  See Function Navigator for Current Functional Status.   Therapy/Group: Individual Therapy  SAGUIER,JULIA 05/22/2016, 9:41 AM

## 2016-05-22 NOTE — Progress Notes (Signed)
Trach removed per MD's order placed a gauze and tape per protocol, patient tolerated well SATS remained at 985 on room air.

## 2016-05-22 NOTE — Progress Notes (Addendum)
[place] PHYSICAL MEDICINE & REHABILITATION     PROGRESS NOTE    Subjective/Complaints: Up with therapy. Seems to tolerate more movement in LUE and LLE. Cooperating with therapy  ROS limited by aphasia  Objective: Vital Signs: Blood pressure 108/71, pulse 78, temperature 98.4 F (36.9 C), temperature source Oral, resp. rate 18, height 5\' 4"  (1.626 m), weight 84.7 kg (186 lb 12.8 oz), last menstrual period 05/01/2016, SpO2 99 %. No results found.  Recent Labs  05/21/16 0535  WBC 7.6  HGB 11.5*  HCT 37.2  PLT 409*    Recent Labs  05/21/16 0535  NA 138  K 3.9  CL 105  GLUCOSE 83  BUN 14  CREATININE 0.61  CALCIUM 9.5   CBG (last 3)   Recent Labs  05/21/16 1807 05/21/16 2325 05/22/16 0557  GLUCAP 93 121* 87    Wt Readings from Last 3 Encounters:  05/20/16 84.7 kg (186 lb 12.8 oz)  05/20/16 84.4 kg (186 lb 1.6 oz)    Physical Exam:  Constitutional: She appears well-developed and well-nourished.  HENT:  Mouth/Throat: Oropharynx is clear and moist.  Right crani incision well healed, area clean.    Eyes: Conjunctivae are normal. Pupils are equal, round, and reactive to light.    Neck: Neck supple.  CFS # 5 trach in place with button plug. Tolerating well  Cardiovascular: RRR.   Respiratory: Effort normal and breath sounds normal. Clear bilaterally GI: Soft. Bowel sounds are normal. She exhibits no distension. There is no tenderness.  PEG site clean without drainage.  Musculoskeletal:  No edema or tenderness in extremities  Neurological: She is alert.  Non verbal--can follow some simple commands. Smiles appropriately when cued.    Unable to move eyes to left field and ?visual deficits. Spastic left hemiparesis with tight left heel cord noted---generally 1/4---pain component. Dysesthesias LUE and LLE which limits exam of these as she became noticeably uncomfortable with basic movements.  Moves RUE/RLE freely  Skin: Skin is warm and dry.  Psychiatric:  flat, but attempts to cooperate.   Assessment/Plan: 1. 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 Physiatrist is providing close team supervision and 24 hour management of active medical problems listed below. Physiatrist and rehab team continue to assess barriers to discharge/monitor patient progress toward functional and medical goals.  Function:  Bathing Bathing position   Position: Bed  Bathing parts Body parts bathed by patient: Chest, Front perineal area, Abdomen, Left arm Body parts bathed by helper: Right arm, Buttocks, Right upper leg, Left upper leg, Right lower leg, Left lower leg, Back  Bathing assist Assist Level: 2 helpers      Upper Body Dressing/Undressing Upper body dressing   What is the patient wearing?: Pull over shirt/dress     Pull over shirt/dress - Perfomed by patient: Put head through opening Pull over shirt/dress - Perfomed by helper: Thread/unthread right sleeve, Thread/unthread left sleeve, Pull shirt over trunk        Upper body assist        Lower Body Dressing/Undressing Lower body dressing   What is the patient wearing?: Pants, Socks, Shoes       Pants- Performed by helper: Thread/unthread right pants leg, Thread/unthread left pants leg, Pull pants up/down       Socks - Performed by helper: Don/doff right sock, Don/doff left sock   Shoes - Performed by helper: Don/doff right  shoe, Don/doff left shoe, Fasten right, Fasten left          Lower body assist Assist for lower body dressing: 2 Helpers      Financial traderToileting Toileting Toileting activity did not occur: Safety/medical concerns        Toileting assist     Transfers Chair/bed transfer     Chair/bed transfer assist level: 2 helpers       Locomotion Ambulation Ambulation activity did not occur: Safety/medical concerns         Wheelchair    Type: Manual   Assist Level: Dependent (Pt equals 0%)  Cognition Comprehension Comprehension assist level: Understands basic 50 - 74% of the time/ requires cueing 25 - 49% of the time  Expression Expression assist level: Expresses basic 50 - 74% of the time/requires cueing 25 - 49% of the time. Needs to repeat parts of sentences.  Social Interaction Social Interaction assist level: Interacts appropriately 50 - 74% of the time - May be physically or verbally inappropriate.  Problem Solving Problem solving assist level: Solves basic less than 25% of the time - needs direction nearly all the time or does not effectively solve problems and may need a restraint for safety  Memory Memory assist level: Recognizes or recalls 25 - 49% of the time/requires cueing 50 - 75% of the time     Medical Problem List and Plan: 1.  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  -continue PT/OT/SLP 2.  DVT Prophylaxis/Anticoagulation: Mechanical: Sequential compression devices, below knee Bilateral lower extremities. LLE dopplers 12/26 negative for DVT 3. Headaches/Pain Management:   -  gabapentin for headaches and dysesthesias has helped 4. Mood: LCSW to follow for evaluation and support when appropriate.  5. Neuropsych: This patient is not capable of making decisions on her own behalf. 6. Skin/Wound Care: routine pressure relief measures.  7. Fluids/Electrolytes/Nutrition: Monitor output. Transitioning to bolus tube feeds. NPO for now.  - labs wnl   8. VDRF: Has been tolerating button plug for past 1-2 weeks per reports.  -decannulate today---compressive dressing 9. New onset seizures: Continue Keppra bid.  10. Spasticity/Neuropathy LUE/LLE: see above  -add low dose baclofen 5mg  BID 11. E Coli UTI---cipro with sens pending  .  LOS (Days) 2 A FACE TO FACE EVALUATION WAS PERFORMED  Ranelle OysterSWARTZ,Tiasia Weberg T, MD 05/22/2016 10:40 AM

## 2016-05-22 NOTE — Progress Notes (Signed)
Physical Therapy Session Note  Patient Details  Name: Cheryl ClinesLaura Forget MRN: [id number] Date of Birth: 09/19/1990  Today's Date: 05/22/2016 PT Individual Time: 3016-01091304-1417 PT Individual Time Calculation (min): 73 min    Short Term Goals: Week 1:  PT Short Term Goal 1 (Week 1): pt will perform sitting balance with mod A PT Short Term Goal 2 (Week 1): pt will assist with functional transfers 25%  Skilled Therapeutic Interventions/Progress Updates:    Pt reports 9/10 (by writing on wipe board) pain prior to session in R side of head.  Pt mother present at this time and states that pt cannot have more pain meds until 1400.  Performed gentle PROM into L ankle Df, hip abduction, and knee flexion.  Pt performed supine to sit x 2 with total assist from 2 helpers.  Sat edge of bed with mod A x 2 x 5 minutes for two bouts.  Pt requested to utilize bed pan, however, had accident prior to utilizing bed pan.  Nursing and therapist changed diaper with rolling bilaterally.  Pt then wanted to sit in wheelchair and was transferred to chair with maxi-move and positioned.  Head rest was adjusted to accomodate helmet at pt request due to increased pain with prior sitting attempts.  Sp02 remained greater than 90% for majority of session, however, pt did occasionally dip to 85%.  Pt left seated up in chair with lap belt and in care of nursing to administer pain medications.    Therapy Documentation Precautions:  Precautions Precautions: Fall Precaution Comments: helmet when OOB Restrictions Weight Bearing Restrictions: No   See Function Navigator for Current Functional Status.   Therapy/Group: Individual Therapy  Natalya Domzalski Elveria RisingM Demaryius Imran 05/22/2016, 3:16 PM

## 2016-05-22 NOTE — Progress Notes (Signed)
MEDICATION RELATED NOTE   Pharmacy Re:  Home Medications  Medications:  Prescriptions Prior to Admission  Medication Sig Dispense Refill Last Dose  . atorvastatin (LIPITOR) 20 MG tablet Take 20 mg by mouth daily at 6 PM.     . ciprofloxacin (CIPRO) 250 MG tablet Take 250 mg by mouth 2 (two) times daily.     . ferrous sulfate (FER-IN-SOL) 75 (15 Fe) MG/ML SOLN Take 15 mg of iron by mouth 2 (two) times daily.     . folic acid (FOLVITE) 1 MG tablet Take 1 mg by mouth daily.     Marland Kitchen. gabapentin (NEURONTIN) 250 MG/5ML solution Take 100 mg by mouth 3 (three) times daily.     Marland Kitchen. levETIRAcetam (KEPPRA) 100 MG/ML solution Take 1,000 mg by mouth 2 (two) times daily.     . Multiple Vitamin (MULTIVITAMIN) LIQD Take 15 mLs by mouth daily.     . polyethylene glycol (MIRALAX / GLYCOLAX) packet Take 17 g by mouth daily.     . QUEtiapine (SEROQUEL) 25 MG tablet Take 12.5 mg by mouth at bedtime as needed.     . sertraline (ZOLOFT) 20 MG/ML concentrated solution Take 50 mg by mouth daily.     Marland Kitchen. topiramate (TOPAMAX) 25 MG tablet Take 25 mg by mouth at bedtime.     . traZODone (DESYREL) 50 MG tablet Take 25-50 mg by mouth at bedtime as needed for sleep.       Assessment: Attempted to complete medication history for this patient.  [facility] and now to rehab.  The medications listed above are scheduled medications found from these places and represent medications she was prescribed there.  Some of these have been continued and started here as well.    Plan:  I have marked her medication history complete - resume those medications you feel appropriate at discharge.  Nadara MustardNita Lakecia Deschamps, PharmD., MS Clinical Pharmacist Pager:  [phone] Thank you for allowing pharmacy to be part of this patients care team. 05/22/2016,10:19 AM

## 2016-05-22 NOTE — Progress Notes (Signed)
Orthopedic Tech Progress Note Patient Details:  Cheryl ClinesLaura Gibbs 07/27/1990 [id number]  Ortho Devices Type of Ortho Device: Ankle Air splint Ortho Device/Splint Location: lle Ortho Device/Splint Interventions: Application   Cheryl Gibbs 05/22/2016, 9:57 AM

## 2016-05-23 ENCOUNTER — Inpatient Hospital Stay (HOSPITAL_COMMUNITY): Payer: Managed Care, Other (non HMO) | Admitting: Speech Pathology

## 2016-05-23 ENCOUNTER — Inpatient Hospital Stay (HOSPITAL_COMMUNITY): Payer: Managed Care, Other (non HMO) | Admitting: Physical Therapy

## 2016-05-23 ENCOUNTER — Inpatient Hospital Stay (HOSPITAL_COMMUNITY): Payer: Managed Care, Other (non HMO) | Admitting: Occupational Therapy

## 2016-05-23 ENCOUNTER — Inpatient Hospital Stay (HOSPITAL_COMMUNITY): Payer: Managed Care, Other (non HMO) | Admitting: *Deleted

## 2016-05-23 LAB — GLUCOSE, CAPILLARY
GLUCOSE-CAPILLARY: 107 mg/dL — AB (ref 65–99)
Glucose-Capillary: 113 mg/dL — ABNORMAL HIGH (ref 65–99)
Glucose-Capillary: 113 mg/dL — ABNORMAL HIGH (ref 65–99)
Glucose-Capillary: 108 mg/dL — ABNORMAL HIGH (ref 65–99)

## 2016-05-23 LAB — URINE CULTURE: Culture: >=100000 — AB

## 2016-05-23 NOTE — Progress Notes (Signed)
Speech Language Pathology Daily Session Note  Patient Details  Name: Cheryl ClinesLaura Gibbs MRN: [id number] Date of Birth: 11/17/1990  Today's Date: 05/23/2016 SLP Individual Time: 0915-1000 SLP Individual Time Calculation (min): 45 min   Short Term Goals: Week 1: SLP Short Term Goal 1 (Week 1): Patient will scan to left field of enviornment to locate specific objects, etc with Mod A verbal and visual cues.  SLP Short Term Goal 2 (Week 1): Patient will demonstrate sustained attention to functional tasks for 2 minutes with Mod A verbal cues.  SLP Short Term Goal 3 (Week 1): Patient will vocalize on command in 25% of opportunities with Max A multimodal cues.  SLP Short Term Goal 4 (Week 1): Patient will demonstrate reading comprehension at the phrase level with 75% accuracy with Mod A multimodal cues.  SLP Short Term Goal 5 (Week 1): Patient will consume PO trials with minimal overt s/s of aspiration and initiate a swallow response in 100% of trials with Min A verbal cues over 2 sessions to assess readiness for an objective swallow study.   Skilled Therapeutic Interventions: Skilled treatment session focused on dysphagia and functional communication. Patient's mother present at beginning of session and reported patient's front teeth are sensitive due to crowns being removed for intubation. Therefore, patient administered trials of thin liquids via spoon and small, portion controlled cup sips. Patient demonstrated severely impaired labial seal and anterior spillage with overt coughing in 100% of trials. Recommend patient remain NPO. Patient utilized dry erase board to communicate with Max A verbal cues and did not perform any attempts to vocalize despite Max A multimodal cues for clinician.  Suspect function impacted by fatigue and pain today. Patient reported she did not sleep well because she was thinking about her kids and was crying intermittently due to pain in bottom. RN aware and administered medications.  Patient handed off to PT. Continue with current plan of care.   Function:   Cognition Comprehension Comprehension assist level: Understands basic 50 - 74% of the time/ requires cueing 25 - 49% of the time  Expression Expression assistive device: Communication board Expression assist level: Expresses basic 50 - 74% of the time/requires cueing 25 - 49% of the time. Needs to repeat parts of sentences.  Social Interaction Social Interaction assist level: Interacts appropriately 50 - 74% of the time - May be physically or verbally inappropriate.  Problem Solving Problem solving assist level: Solves basic less than 25% of the time - needs direction nearly all the time or does not effectively solve problems and may need a restraint for safety  Memory Memory assist level: Recognizes or recalls 25 - 49% of the time/requires cueing 50 - 75% of the time    Pain Pain Assessment Pain Assessment: 0-10 Pain Score:7/10 Pain Type: Acute pain Pain Location: bottom and neck  Pain Orientation: Posterior (and neck) Pain Descriptors / Indicators: Grimacing Pain Frequency: Intermittent Pain Onset: Gradual Pain Intervention(s): Medication (See eMAR)  Therapy/Group: Individual Therapy  Cheryl Gibbs 05/23/2016, 12:44 PM

## 2016-05-23 NOTE — Progress Notes (Signed)
Physical Therapy Session Note  Patient Details  Name: Cheryl ClinesLaura Laumann MRN: [id number] Date of Birth: 01/18/1991  Today's Date: 05/23/2016 PT Individual Time: 1000-1053 PT Individual Time Calculation (min): 53 min & 30min   Short Term Goals: Week 1:  PT Short Term Goal 1 (Week 1): pt will perform sitting balance with mod A PT Short Term Goal 2 (Week 1): pt will assist with functional transfers 25%  Skilled Therapeutic Interventions/Progress Updates:   Tx 1: Pt presented in TIS chair with SLP present.  Per SLP pt communicated increased soreness in buttock from chair, and feeling wet. Performed lateral scoot transfer to L Pt requiring maxA x 2 for transfer. MaxA x2 for positioning. Pt maxA x 2 rolling L/R to change brief (+ void). MaxA x2 supine to sit at EOB.  Sitting tolerance x7 min with PTA providing max cues for increased core activation 2/2 posteriorlateral lean.  Sit to supine maxA x 2, pt able to tolerate PROM to LLE in small range incl DF/PF, knee flexion, hip abd, hip er/ir x 8-10 reps ea. Active DF/PF, SAQ, hip abd/add, AA SLR x10 on RLE.  Pt becoming increasingly lethargic throughout session with limited participation by end of session. Pt left in bed with alarm on and call bell within reach.   Tx 2: Pt in bed with visitors present. Performed supine to sit to R at EOB with x 2 seated trials 5min and 4 min respectively. Pt returned to supine after each bout 2/2 fatigue. In sitting required max cues for core activation to decrease R posterior lateral lean.  Pt able to improve unsupported sitting for short periods to modA.  Pt maxA x1 sit to supine and required +2 [facility]. Pt left in bed at end of session with alarm on and call bell within reach.   Therapy Documentation Precautions:  Precautions Precautions: Fall Precaution Comments: helmet when OOB Restrictions Weight Bearing Restrictions: No General:   Vital Signs:   Pain: Pain Assessment Pain Assessment: 0-10 Pain  Score: Asleep Pain Type: Acute pain Pain Location: Head Pain Orientation: Posterior (and neck) Pain Descriptors / Indicators: Grimacing Pain Frequency: Intermittent Pain Onset: Gradual Pain Intervention(s): Medication (See eMAR)    Therapy/Group: Individual Therapy  Miko Sirico  Reginald Mangels, PTA  05/23/2016, 12:15 PM

## 2016-05-23 NOTE — Progress Notes (Signed)
Occupational Therapy Session Note  Patient Details  Name: Cheryl ClinesLaura Gibbs MRN: [id number] Date of Birth: 06/17/1990  Today's Date: 05/23/2016 OT Individual Time: [id number] OT Individual Time Calculation (min): 58 min   Short Term Goals: Week 1:  OT Short Term Goal 1 (Week 1): Pt will be able to sit at EOB for 5 minutes with mod A to maintain balance. OT Short Term Goal 2 (Week 1): Pt will tolerate self bathing her LUE. OT Short Term Goal 3 (Week 1): Pt will don a shirt with mod A. OT Short Term Goal 4 (Week 1): Pt will sit to stand with 1 person A with max A. OT Short Term Goal 5 (Week 1): Pt will use R hand for self ROM of L fingers. Week 2:     Skilled Therapeutic Interventions/Progress Updates:   Pt was lying in bed with mother present at time of arrival. Pt used communication board to interact with therapist initially and then used thumbs up/down signs with yes/no questions for remainder of tx for communication. Pt wrote down that she was wet. Brief change completed with 2 helpers with pt able to initiate proper rolling technique to left side while protecting L UE. Pt cried when rolled to right side due to helpers needing to touch left trunk/UE to assist. Crying subsided when returning to supine. Pants donned bedlevel with HOB elevated and 2 helpers for rolling as needed, cues for attending to left side. Pt was able to assist with pulling pants up on right side. Supine<sit completed with Total A, coming to EOB on left side. Pt completed lateral scoot transfer to TIS with 2 helpers with max multimodal cues on technique to right side. Once in w/c pt donned shirt with Max A while seated at sink with instruction on hemi technique. Helmet donned. When pt looked at reflection in mirror she began to cry. Pt was provided encouragement and educated on OT POC/goals to enhance volition, self efficacy, and psychosocial wellness. 02 sats over 90% on room air during session, pt decannulated yesterday. After she  washed her face with supervision, her mother arrived. Pt was reclined in TIS with safety belt in place. Mother educated on propelling pt around unit for stimulation and encouraging left visual field scanning between therapies. Pt was left with mother and nursing at time of departure.   Therapy Documentation Precautions:  Precautions Precautions: Fall Precaution Comments: helmet when OOB Restrictions Weight Bearing Restrictions: No   Pain: Expressions of pain when touching left side that subsided with rest and calming cues Pain Assessment Pain Assessment: 0-10 Pain Score: Asleep Pain Type: Acute pain Pain Location: Head Pain Orientation: Posterior (and neck) Pain Descriptors / Indicators: Grimacing Pain Frequency: Intermittent Pain Onset: Gradual Pain Intervention(s): Medication (See eMAR) ADL: ADL ADL Comments: refer to navigator:    See Function Navigator for Current Functional Status.   Therapy/Group: Individual Therapy  Arturo Freundlich A Graviel Payeur 05/23/2016, 12:34 PM

## 2016-05-23 NOTE — Progress Notes (Signed)
[facility] PHYSICAL MEDICINE & REHABILITATION     PROGRESS NOTE    Subjective/Complaints: Mother in room reports that she had headache last night. Head was "sensitive" when she laid on the right side. Left arm and leg are better. Still has pulse ox attached  ROS limited by language  Objective: Vital Signs: Blood pressure 110/76, pulse 73, temperature 99 F (37.2 C), temperature source Oral, resp. rate 18, height 5\' 4"  (1.626 m), weight 84.7 kg (186 lb 12.8 oz), last menstrual period 05/01/2016, SpO2 99 %. No results found.  Recent Labs  05/21/16 0535  WBC 7.6  HGB 11.5*  HCT 37.2  PLT 409*    Recent Labs  05/21/16 0535  NA 138  K 3.9  CL 105  GLUCOSE 83  BUN 14  CREATININE 0.61  CALCIUM 9.5   CBG (last 3)   Recent Labs  05/22/16 1855 05/22/16 2333 05/23/16 0540  GLUCAP 119* 102* 107*    Wt Readings from Last 3 Encounters:  05/20/16 84.7 kg (186 lb 12.8 oz)  05/20/16 84.4 kg (186 lb 1.6 oz)    Physical Exam:  Constitutional: She appears well-developed and well-nourished.  HENT:  Mouth/Throat: Oropharynx is clear and moist.  Right crani incision well healed, area clean.    Eyes: Conjunctivae are normal. Pupils are equal, round, and reactive to light.    Neck: Neck supple.  Trach stoma already closing.  Cardiovascular: RRR.   Respiratory: Effort normal and breath sounds normal. Clear bilaterally GI: Soft. Bowel sounds are normal. She exhibits no distension. There is no tenderness.  PEG site clean without drainage.  Musculoskeletal:  Decreased tenderness in extremities  Neurological: She is alert.  Non verbal--can follow some simple commands. Attempted to use dry-erase board but wrote non-sensical words  Unable to move eyes to left field and ?visual deficits. Spastic left hemiparesis with tight left heel cord noted---generally 1/4---pain component. Dysesthesias LUE and LLE which limits exam of these as she became noticeably uncomfortable with basic  movements.  Moves RUE/RLE freely  Skin: Skin is warm and dry.  Psychiatric: flat but engages when cued.   Assessment/Plan: 1. 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 Physiatrist is providing close team supervision and 24 hour management of active medical problems listed below. Physiatrist and rehab team continue to assess barriers to discharge/monitor patient progress toward functional and medical goals.  Function:  Bathing Bathing position   Position: Bed  Bathing parts Body parts bathed by patient: Chest, Front perineal area, Abdomen, Left arm Body parts bathed by helper: Right arm, Buttocks, Right upper leg, Left upper leg, Right lower leg, Left lower leg, Back  Bathing assist Assist Level: 2 helpers      Upper Body Dressing/Undressing Upper body dressing   What is the patient wearing?: Pull over shirt/dress     Pull over shirt/dress - Perfomed by patient: Put head through opening Pull over shirt/dress - Perfomed by helper: Thread/unthread right sleeve, Thread/unthread left sleeve, Pull shirt over trunk        Upper body assist        Lower Body Dressing/Undressing Lower body dressing   What is the patient wearing?: Pants, Socks, Shoes       Pants- Performed by helper: Thread/unthread right pants leg, Thread/unthread left pants leg, Pull pants up/down       Socks - Performed by helper: Don/doff right sock, Don/doff left  sock   Shoes - Performed by helper: Don/doff right shoe, Don/doff left shoe, Fasten right, Fasten left          Lower body assist Assist for lower body dressing: 2 Helpers      Toileting Toileting Toileting activity did not occur: Safety/medical concerns   Toileting steps completed by helper: Adjust clothing prior to toileting, Performs perineal hygiene, Adjust clothing after toileting    Toileting assist  Assist level: Two helpers   Transfers Chair/bed transfer     Chair/bed transfer assist level: 2 helpers Chair/bed transfer assistive device: Mechanical lift Mechanical lift: Maximove   Locomotion Ambulation Ambulation activity did not occur: Safety/medical concerns         Wheelchair   Type: Manual   Assist Level: Dependent (Pt equals 0%)  Cognition Comprehension Comprehension assist level: Understands basic 50 - 74% of the time/ requires cueing 25 - 49% of the time  Expression Expression assist level: Expresses basic 50 - 74% of the time/requires cueing 25 - 49% of the time. Needs to repeat parts of sentences.  Social Interaction Social Interaction assist level: Interacts appropriately 50 - 74% of the time - May be physically or verbally inappropriate.  Problem Solving Problem solving assist level: Solves basic less than 25% of the time - needs direction nearly all the time or does not effectively solve problems and may need a restraint for safety  Memory Memory assist level: Recognizes or recalls 25 - 49% of the time/requires cueing 50 - 75% of the time     Medical Problem List and Plan: 1.  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  -continue PT/OT/SLP 2.  DVT Prophylaxis/Anticoagulation: Mechanical: Sequential compression devices, below knee Bilateral lower extremities. LLE dopplers 12/26 negative for DVT 3. Headaches/Pain Management:   -  gabapentin for headaches and dysesthesias has helped--continue at current dose  -explained to mom that she needs to avoid laying on craniectomy side 4. Mood: LCSW to follow for evaluation and support when appropriate.  5. Neuropsych: This patient is not capable of making decisions on her own behalf. 6. Skin/Wound Care: routine pressure relief measures.  7. Fluids/Electrolytes/Nutrition: Bolus TF's.  NPO for now.  - labs wnl   8. VDRF:decannulated without issue 9. New  onset seizures: Continue Keppra bid.  10. Spasticity/Neuropathy LUE/LLE: see above  -added low dose baclofen 5mg  BID 11. E Coli UTI---sens to cipro  .  LOS (Days) 3 A FACE TO FACE EVALUATION WAS PERFORMED  Ranelle OysterSWARTZ,Duane Trias T, MD 05/23/2016 9:44 AM

## 2016-05-23 NOTE — IPOC Note (Signed)
Overall Plan of Care [facility](IPOC) Patient Details Name: Cheryl Gibbs MRN: [id number] DOB: 06/29/1990  Admitting Diagnosis: Right intracranial hemorrhage   Hospital Problems: Principal Problem:   Hemorrhage, intracranial (HCC) Active Problems:   Global aphasia   Spastic hemiparesis affecting nondominant side (HCC)   Vascular headache   Dysphagia, post-stroke   Tracheostomy status (HCC)   Seizure (HCC)   Neuropathy (HCC)   Nonverbal     Functional Problem List: Nursing Bladder, Nutrition, Bowel  PT Motor, Pain, Balance, Endurance, Sensory, Safety  OT Balance, Cognition, Endurance, Motor, Perception, Sensory, Vision  SLP Cognition  TR         Basic ADL's: OT Grooming, Bathing, Dressing, Toileting, Eating     Advanced  ADL's: OT       Transfers: PT Bed Mobility, Bed to Chair, Car, Occupational psychologisturniture  OT Toilet, Research scientist (life sciences)Tub/Shower     Locomotion: PT Ambulation, Psychologist, prison and probation servicesWheelchair Mobility, Stairs     Additional Impairments: OT Fuctional Use of Upper Extremity  SLP Swallowing expression Social Interaction, Problem Solving, Memory, Attention, Awareness  TR      Anticipated Outcomes Item Anticipated Outcome  Self Feeding supervision if no longer NPO  Swallowing  Min A with least restrictive diet    Basic self-care  mod A  Toileting  max A   Bathroom Transfers mod A   Bowel/Bladder  manage bladderr and bowel with min  Transfers  min A  Locomotion  min A gait, supervision w/c level  Communication  Min A  Cognition  Min A   Pain  Manage pain with scheduled and prn medications  Safety/Judgment  maintain safety with cues/supervision   Therapy Plan: PT Intensity: Minimum of 1-2 x/day ,45 to 90 minutes PT Frequency: 5 out of 7 days PT Duration Estimated Length of Stay: 4-6 weeks OT Intensity: Minimum of 1-2 x/day, 45 to 90 minutes OT Frequency: 5 out of 7 days OT Duration/Estimated Length of Stay: 24-26 days SLP Intensity: Minumum of 1-2 x/day, 30 to 90 minutes SLP Frequency: 3 to  5 out of 7 days SLP Duration/Estimated Length of Stay: 4-6 weeks       Team Interventions: Nursing Interventions Patient/Family Education, Disease Management/Prevention, Skin Care/Wound Management, Pain Management, Cognitive Remediation/Compensation, Psychosocial Support, Bowel Management, Medication Management, Dysphagia/Aspiration Precaution Training, Bladder Management, Discharge Planning  PT interventions Ambulation/gait training, Cognitive remediation/compensation, Discharge planning, DME/adaptive equipment instruction, Functional mobility training, Pain management, Splinting/orthotics, Therapeutic Activities, UE/LE Strength taining/ROM, Wheelchair propulsion/positioning, UE/LE Coordination activities, Therapeutic Exercise, Stair training, Patient/family education, Neuromuscular re-education, Functional electrical stimulation, Community reintegration, Warden/rangerBalance/vestibular training  OT Interventions Warden/rangerBalance/vestibular training, Cognitive remediation/compensation, DME/adaptive equipment instruction, Discharge planning, Functional mobility training, Neuromuscular re-education, Psychosocial support, Patient/family education, Self Care/advanced ADL retraining, Pain management, UE/LE Strength taining/ROM, Therapeutic Exercise, Therapeutic Activities, UE/LE Coordination activities, Visual/perceptual remediation/compensation  SLP Interventions Functional tasks, Patient/family education, Financial traderCueing hierarchy, Dysphagia/aspiration precaution training, Therapeutic Activities  TR Interventions    SW/CM Interventions Discharge Planning, Psychosocial Support, Patient/Family Education    Team Discharge Planning: Destination: PT-Home ,OT- Home , SLP-Home Projected Follow-up: PT-Home health PT, OT-  Home health OT, SLP-24 hour supervision/assistance, Outpatient SLP, Home Health SLP Projected Equipment Needs: PT-To be determined, OT- Tub/shower bench, 3 in 1 bedside comode, SLP-To be determined Equipment Details: PT- ,  OT-  Patient/family involved in discharge planning: PT- Patient, Family member/caregiver,  OT-Patient unable/family or caregiver not available, SLP-Patient  MD ELOS: 4-6 weeks Medical Rehab Prognosis:  Excellent Assessment: The patient has been admitted for CIR therapies with the diagnosis of right intracranial hemorrhage with  subsequent respiratory failure. The team will be addressing functional mobility, strength, stamina, balance, safety, adaptive techniques and equipment, self-care, bowel and bladder mgt, patient and caregiver education, trach mgt, speech, language, swallowing, ego support, NMR, orthotics, pain control, spasticity mgt. Goals have been set at min assist for basic transfers and locomotion, supervision w/c, mod to max assist with self-care and ADL's, min assist with communication/cognition.    Ranelle OysterZachary T. Ellaina Schuler, MD, FAAPMR      See Team Conference Notes for weekly updates to the plan of care

## 2016-05-24 ENCOUNTER — Inpatient Hospital Stay (HOSPITAL_COMMUNITY): Payer: Managed Care, Other (non HMO) | Admitting: *Deleted

## 2016-05-24 ENCOUNTER — Inpatient Hospital Stay (HOSPITAL_COMMUNITY): Payer: Managed Care, Other (non HMO) | Admitting: Occupational Therapy

## 2016-05-24 ENCOUNTER — Inpatient Hospital Stay (HOSPITAL_COMMUNITY): Payer: Managed Care, Other (non HMO) | Admitting: Physical Therapy

## 2016-05-24 ENCOUNTER — Inpatient Hospital Stay (HOSPITAL_COMMUNITY): Payer: Managed Care, Other (non HMO) | Admitting: Speech Pathology

## 2016-05-24 LAB — GLUCOSE, CAPILLARY
GLUCOSE-CAPILLARY: 91 mg/dL (ref 65–99)
Glucose-Capillary: 112 mg/dL — ABNORMAL HIGH (ref 65–99)
Glucose-Capillary: 124 mg/dL — ABNORMAL HIGH (ref 65–99)

## 2016-05-24 NOTE — Progress Notes (Signed)
Speech Language Pathology Daily Session Note  Patient Details  Name: Cheryl ClinesLaura Siefken MRN: [id number] Date of Birth: 12/04/1990  Today's Date: 05/24/2016 SLP Individual Time: [id number] SLP Individual Time Calculation (min): 45 min   Short Term Goals: Week 1: SLP Short Term Goal 1 (Week 1): Patient will scan to left field of enviornment to locate specific objects, etc with Mod A verbal and visual cues.  SLP Short Term Goal 2 (Week 1): Patient will demonstrate sustained attention to functional tasks for 2 minutes with Mod A verbal cues.  SLP Short Term Goal 3 (Week 1): Patient will vocalize on command in 25% of opportunities with Max A multimodal cues.  SLP Short Term Goal 4 (Week 1): Patient will demonstrate reading comprehension at the phrase level with 75% accuracy with Mod A multimodal cues.  SLP Short Term Goal 5 (Week 1): Patient will consume PO trials with minimal overt s/s of aspiration and initiate a swallow response in 100% of trials with Min A verbal cues over 2 sessions to assess readiness for an objective swallow study.   Skilled Therapeutic Interventions: Skilled treatment session focused on cognition goals. SLP facilitated session by providing Min A verbal cues for recall of orientation information. Pt able to write informatoin on white board and conveyed orientation information correctly x 4. SLP further facilitated session by providing Mod A verbal and visual cues for attention to left field of environment and for sustained attention to functional task for ~5 minute intervals during a number sequencing calendar task. Towards end of task, pt required Min A verbal cues to review number placement and able to self correct placement with Min A verbal cues. Pt's husband present for session. Pt left in bed with bed alarm on and husband present. All needs were within reach. Continue current plan of care.   Function:    Cognition Comprehension Comprehension assist level: Understands basic 50  - 74% of the time/ requires cueing 25 - 49% of the time  Expression Expression assistive device: Communication board Expression assist level: Expresses basic 50 - 74% of the time/requires cueing 25 - 49% of the time. Needs to repeat parts of sentences.  Social Interaction Social Interaction assist level: Interacts appropriately 50 - 74% of the time - May be physically or verbally inappropriate.  Problem Solving Problem solving assist level: Solves basic 25 - 49% of the time - needs direction more than half the time to initiate, plan or complete simple activities  Memory Memory assist level: Recognizes or recalls 25 - 49% of the time/requires cueing 50 - 75% of the time    Pain    Therapy/Group: Individual Therapy  Shrihan Putt 05/24/2016, 12:03 PM

## 2016-05-24 NOTE — Progress Notes (Signed)
Physical Therapy Session Note  Patient Details  Name: Cheryl Gibbs MRN: [id number] Date of Birth: 04/28/1991  Today's Date: 05/24/2016 PT Individual Time: [id number] PT Individual Time Calculation (min): 44 min    Short Term Goals: Week 1:  PT Short Term Goal 1 (Week 1): pt will perform sitting balance with mod A PT Short Term Goal 2 (Week 1): pt will assist with functional transfers 25%  Skilled Therapeutic Interventions/Progress Updates:    Per discussion with OT & RN pt was experiencing pain with helmet donned 2/2 increased swelling. Per RN pt cleared to sit up on EOB without helmet with assist from PT. Pt received in bed with family present, who all except her husband, exited upon PT arrival. Time was spent building rapport with pt. Pt able to communicate via dry erase board. Pt noted her head hurt but did not rate pain & RN aware. Pt required mod assist +2 assist to transfer supine<>sitting EOB and max assist +1 to scoot to EOB. Pt c/o posterior neck and stomach pain while sitting at EOB, but noted neck pain decreased with improved posture. Pt tolerated sitting EOB x ~15 minutes with mod/max assist overall 2/2 posterior, L lateral lean that pt was unable to correct. Session focused on L attention, core strengthening, and sitting balance. Pt able to attend and write on dry erase board with it positioned on her left but continues to only write on R side of board. Pt able to recall date, hometown, and names of children during session. Pt's husband present & involved, encouraging pt throughout session. Pt requested to return supine 2/2 neck pain and then assisted with [facility]ooting to [facility] with cuing for hand placement. At end of session pt left in bed with all needs within reach, family present & 4 rails up (per family request).  Pt with no active muscle activation in LUE/LE.  Therapy Documentation Precautions:  Precautions Precautions: Fall Precaution Comments: helmet when OOB Restrictions Weight  Bearing Restrictions: No   See Function Navigator for Current Functional Status.   Therapy/Group: Individual Therapy  Sandi MariscalVictoria M Sindi Beckworth 05/24/2016, 5:32 PM

## 2016-05-24 NOTE — Progress Notes (Signed)
Occupational Therapy Session Note  Patient Details  Name: Cheryl ClinesLaura Gibbs MRN: [id number] Date of Birth: 11/29/1990  Today's Date: 05/24/2016 OT Individual Time: 1100-1230 OT Individual Time Calculation (min): 90 min     Short Term Goals: Week 1:  OT Short Term Goal 1 (Week 1): Pt will be able to sit at EOB for 5 minutes with mod A to maintain balance. OT Short Term Goal 2 (Week 1): Pt will tolerate self bathing her LUE. OT Short Term Goal 3 (Week 1): Pt will don a shirt with mod A. OT Short Term Goal 4 (Week 1): Pt will sit to stand with 1 person A with max A. OT Short Term Goal 5 (Week 1): Pt will use R hand for self ROM of L fingers.  Skilled Therapeutic Interventions/Progress Updates:   lpatient received supine with HOB elevated in bed using the bed pan.  Her therapy this session was limited by completing bed bed, changing brief and c/o of pain in both L UE and LE with all movements.    As well, she tended to exhibit left LE high tone/spasm type movements.  Otherwise, she required total A x 2 for brief change, UB bathing and dressing.  She attempted to go supine to edge of bed, but communicated that the helmet hurt her right back/side of head and neck.  Husband compared patient's right side head with picture post surgery and stated he thinks patient has more edema on right side of head.   Nurse made aware by Mr. [name]  As patient precautions include helmet on when Out of Bed, patient was not able to get out of bed for therapies today due to c/o of high pain on right side of head and neck when helmet was donned.  Patient was repositioned in bed and left with her supportive husband.  Therapy Documentation Precautions:  Precautions Precautions: Fall Precaution Comments: helmet when OOB Restrictions Weight Bearing Restrictions: No   Pain:not rated but patient communincated to staff that left arm, side and leg are painful to the touch and all movements   See Function Navigator for  Current Functional Status.   Therapy/Group: Individual Therapy  Rozelle Loganickett, Carless Slatten Yeary 05/24/2016, 2:22 PM

## 2016-05-24 NOTE — Progress Notes (Signed)
Occupational Therapy Session Note  Patient Details  Name: Ned ClinesLaura Powless MRN: [id number] Date of Birth: 12/15/1990  Today's Date: 05/24/2016 OT Individual Time: 1445-1515 OT Individual Time Calculation (min): 30 min     Short Term Goals: Week 1:  OT Short Term Goal 1 (Week 1): Pt will be able to sit at EOB for 5 minutes with mod A to maintain balance. OT Short Term Goal 2 (Week 1): Pt will tolerate self bathing her LUE. OT Short Term Goal 3 (Week 1): Pt will don a shirt with mod A. OT Short Term Goal 4 (Week 1): Pt will sit to stand with 1 person A with max A. OT Short Term Goal 5 (Week 1): Pt will use R hand for self ROM of L fingers.  Skilled Therapeutic Interventions/Progress Updates:   Brief change via patient request and required total A x 2 for brief change and lateral rolls.    Patient supine to/fr edge of bed = Total A x 2 - pain communicated scared and pained on her left side but continued to work when husband entered room and offered physical support on patient's left side of body.  Patient positioned on her right side of body to allow pressure relief on buttocks and was left in the supportive care of her husband.  Patientstill not cleared to go further than edge of bed without helmet.... Current one illfitting and causing pain per patient.    Therapy Documentation Precautions:  Precautions Precautions: Fall Precaution Comments: helmet when OOB Restrictions Weight Bearing Restrictions: No Pain: winced with pain and touch to left UE and LE.   RN gave pains earlier   See Function Navigator for Current Functional Status.   Therapy/Group: Individual Therapy  Rozelle Loganickett, Khyli Swaim Yeary 05/24/2016, 5:55 PM

## 2016-05-24 NOTE — Progress Notes (Signed)
[facility] PHYSICAL MEDICINE & REHABILITATION     PROGRESS NOTE    Subjective/Complaints: No new problems. Husband arrived from his [place] last night.  ROS limited by language   Objective: Vital Signs: Blood pressure 100/68, pulse 65, temperature 98.6 F (37 C), temperature source Oral, resp. rate 18, height 5\' 4"  (1.626 m), weight 84.7 kg (186 lb 12.8 oz), last menstrual period 05/01/2016, SpO2 96 %. No results found. No results for input(s): WBC, HGB, HCT, PLT in the last 72 hours. No results for input(s): NA, K, CL, GLUCOSE, BUN, CREATININE, CALCIUM in the last 72 hours.  Invalid input(s): CO CBG (last 3)   Recent Labs  05/23/16 1653 05/23/16 2250 05/24/16 0544  GLUCAP 113* 108* 91    Wt Readings from Last 3 Encounters:  05/20/16 84.7 kg (186 lb 12.8 oz)  05/20/16 84.4 kg (186 lb 1.6 oz)    Physical Exam:  Constitutional: She appears well-developed and well-nourished.  HENT:  Mouth/Throat: Oropharynx is clear and moist.  Right crani incision well healed, area clean.    Eyes: Conjunctivae are normal. Pupils are equal, round, and reactive to light.    Neck: Neck supple.  Trach stoma already closing.  Cardiovascular: RRR.   Respiratory: Effort normal and breath sounds normal. Clear bilaterally GI: Soft. Bowel sounds are normal. She exhibits no distension. There is no tenderness.  PEG site clean without drainage.  Musculoskeletal:  Decreased tenderness in extremities  Neurological: She is alert.  Non verbal--can follow some simple commands. Attempted to use dry-erase board but wrote non-sensical words  Unable to move eyes to left field and ?visual deficits. Spastic left hemiparesis with tight left heel cord noted---generally 1/4---pain component. Dysesthesias LUE and LLE which limits exam of these as she became noticeably uncomfortable with basic movements.  Moves RUE/RLE freely  Skin: Skin is warm and dry.  Psychiatric: flat but engages when cued.    Assessment/Plan: 1. 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 Physiatrist is providing close team supervision and 24 hour management of active medical problems listed below. Physiatrist and rehab team continue to assess barriers to discharge/monitor patient progress toward functional and medical goals.  Function:  Bathing Bathing position   Position: Bed  Bathing parts Body parts bathed by patient: Chest, Front perineal area, Abdomen, Left arm Body parts bathed by helper: Right arm, Buttocks, Right upper leg, Left upper leg, Right lower leg, Left lower leg, Back  Bathing assist Assist Level: 2 helpers      Upper Body Dressing/Undressing Upper body dressing   What is the patient wearing?: Pull over shirt/dress     Pull over shirt/dress - Perfomed by patient: Put head through opening Pull over shirt/dress - Perfomed by helper: Thread/unthread right sleeve, Thread/unthread left sleeve, Put head through opening, Pull shirt over trunk        Upper body assist Assist Level:  (Max A)      Lower Body Dressing/Undressing Lower body dressing   What is the patient wearing?: Pants, Socks, Shoes       Pants- Performed by helper: Thread/unthread right pants leg, Thread/unthread left pants leg, Pull pants up/down       Socks - Performed by helper: Don/doff right sock, Don/doff left sock   Shoes - Performed by helper: Don/doff right shoe, Don/doff left shoe, Fasten right, Fasten left          Lower  body assist Assist for lower body dressing: 2 Helpers      Toileting Toileting Toileting activity did not occur: Safety/medical concerns   Toileting steps completed by helper: Adjust clothing prior to toileting, Performs perineal hygiene, Adjust clothing after toileting    Toileting assist Assist level: Two helpers   Transfers Chair/bed transfer    Chair/bed transfer method: Lateral scoot Chair/bed transfer assist level: 2 helpers Chair/bed transfer assistive device: Armrests Mechanical lift: Maximove   Locomotion Ambulation Ambulation activity did not occur: Safety/medical concerns         Wheelchair   Type: Manual   Assist Level: Dependent (Pt equals 0%)  Cognition Comprehension Comprehension assist level: Understands basic 50 - 74% of the time/ requires cueing 25 - 49% of the time  Expression Expression assist level: Expresses basic 50 - 74% of the time/requires cueing 25 - 49% of the time. Needs to repeat parts of sentences.  Social Interaction Social Interaction assist level: Interacts appropriately 50 - 74% of the time - May be physically or verbally inappropriate.  Problem Solving Problem solving assist level: Solves basic less than 25% of the time - needs direction nearly all the time or does not effectively solve problems and may need a restraint for safety  Memory Memory assist level: Recognizes or recalls 25 - 49% of the time/requires cueing 50 - 75% of the time     Medical Problem List and Plan: 1.  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  -continue PT/OT/SLP 2.  DVT Prophylaxis/Anticoagulation: Mechanical: Sequential compression devices, below knee Bilateral lower extremities. LLE dopplers 12/26 negative for DVT 3. Headaches/Pain Management:   - continue gabapentin for headaches and dysesthesias      4. Mood: LCSW to follow for evaluation and support when appropriate.  5. Neuropsych: This patient is not capable of making decisions on her own behalf. 6. Skin/Wound Care: routine pressure relief measures.  7. Fluids/Electrolytes/Nutrition: Bolus TF's.  NPO for now.  - labs wnl   8. VDRF:decannulated without issue 9. New onset seizures: Continue Keppra bid.  10. Spasticity/Neuropathy LUE/LLE: see above  -added low dose baclofen 5mg   BID 11. E Coli UTI---sens to cipro  .  LOS (Days) 4 A FACE TO FACE EVALUATION WAS PERFORMED  Ranelle OysterSWARTZ,Lance Huaracha T, MD 05/24/2016 8:55 AM

## 2016-05-24 NOTE — Progress Notes (Signed)
Family concerned about increased swelling at site of crani, change incomfort level of helmet. Notified MD. No orders given. New helmet expected next week. Will continue to monitor and pass on to oncoming RN.

## 2016-05-25 LAB — GLUCOSE, CAPILLARY
GLUCOSE-CAPILLARY: 99 mg/dL (ref 65–99)
Glucose-Capillary: 103 mg/dL — ABNORMAL HIGH (ref 65–99)
Glucose-Capillary: 118 mg/dL — ABNORMAL HIGH (ref 65–99)
Glucose-Capillary: 98 mg/dL (ref 65–99)

## 2016-05-25 MED ORDER — GABAPENTIN 250 MG/5ML PO SOLN
200.0000 mg | Freq: Three times a day (TID) | ORAL | Status: DC
  Administered 2016-05-25 – 2016-05-27 (×6): 200 mg via ORAL
  Filled 2016-05-25 (×7): qty 4

## 2016-05-25 MED ORDER — HYDROCODONE-ACETAMINOPHEN 7.5-325 MG/15ML PO SOLN
15.0000 mL | ORAL | Status: DC | PRN
  Administered 2016-05-25 – 2016-06-23 (×71): 15 mL via ORAL
  Filled 2016-05-25 (×74): qty 15

## 2016-05-25 NOTE — Progress Notes (Signed)
Social Work  Social Work Assessment and Plan  Patient Details  Name: Cheryl Gibbs MRN: [id number] Date of Birth: [birth year]  Today's Date: 05/22/2016  Problem List:  Patient Active Problem List   Diagnosis Date Noted  . Hemorrhage, intracranial (HCC) 05/20/2016  . Global aphasia   . Spastic hemiparesis affecting nondominant side (HCC)   . Vascular headache   . Dysphagia, post-stroke   . Tracheostomy status (HCC)   . Seizure (HCC)   . Neuropathy (HCC)   . Nonverbal    Past Medical History:  Past Medical History:  Diagnosis Date  . ADD (attention deficit disorder)   . Occipital neuralgia    Past Surgical History:  Past Surgical History:  Procedure Laterality Date  . CESAREAN SECTION  03/31/2016   Social History:  has no tobacco, alcohol, and drug history on file.  Family / Support Systems Marital Status: Married How Long?: married April 2017 Patient Roles: Spouse, Parent Spouse/Significant Other: spouse, Caffie Pinto @ [phone] Children: Pt and spouse have a 44 yo (Alisa), 4 yo Tax adviser)  and 7 wk old Press photographer) Other Supports: pt's mother, Jaasia Viglione @ [phone];  spouse' grandmother, Renea Ee - she is currently caring for all 3 of the children Anticipated Caregiver: mom and extended family Ability/Limitations of Caregiver: Mom works but she can work from home.  Has large exteded family closeby who can assist.  Spouse currently deployed to basic training in [place] , [place] Caregiver Availability: 24/7 Family Dynamics: Mother reports that she has a very close relationship with pt and her husband.  Good communication with Jacob's family/ grandmother and trying to work together to meet all family care needs.    Social History Preferred language: English Religion:  Cultural Background: NA Education: HS Read: Yes Write: Yes Employment Status: Employed Name of Employer: Aeronautical engineer Return to Work Plans: doubtful - SSD application begun at [employer]  [employer] Issues: None Guardian/Conservator: None - per MD, pt is not capable of making decisions on her own behalf - defer to spouse and, if not available, then pt's mother.   Abuse/Neglect Physical Abuse: Denies Verbal Abuse: Denies Sexual Abuse: Denies Exploitation of patient/patient's resources: Denies Self-Neglect: Denies  Emotional Status Pt's affect, behavior adn adjustment status: Pt lying in bed and non-verbal still.  Uses wipe off board with treatments.  Introduced myself, however, could not get pt to make eye contact.  Does not appear in any emotional distress at that time, however, mother reports she has been tearful frequently since her spouse left for basic training a few days ago.l Recent Psychosocial Issues: Delivery of son, Heloise Purpura in November. Pyschiatric History: None Substance Abuse History: None  Patient / Family Perceptions, Expectations & Goals Pt/Family understanding of illness & functional limitations: Mother with basic understanding of pt's medical issues but will need ongoing ABI education.  She describes pt's cognition as "fine" per her ability to communicate accurately on the white board. Premorbid pt/family roles/activities: New mother with two other young children.  Working and completely independent. Anticipated changes in roles/activities/participation: Anticipating pt will require 24/7 assistance and level of care TBD.  Mother, spouse and other family all assuming caregiver roles for both pt and her children. Pt/family expectations/goals: "I know she's going to need full-time care.  I need to get [place] home."  [employer]: None Premorbid Home Care/DME Agencies: None Transportation available at discharge: yes Resource referrals recommended: Neuropsychology, Support group (specify), Advocacy groups  Discharge Planning Living Arrangements: Spouse/significant other,  Children Support Systems:  Spouse/significant other, Parent, Other relatives, Friends/neighbors Type of Residence: Private residence Insurance Resources: Media plannerrivate Insurance (specify) Administrator([employer]) Financial Resources: Employment, Garment/textile technologistamily Support Financial Screen Referred: No Living Expenses: Psychologist, sport and exerciseent Money Management: Spouse Does the patient have any problems obtaining your medications?: No Home Management: pt and spouse Patient/Family Preliminary Plans: Plans at this point are for pt to d/c to her mother's home.  Working with [employer] to have spouse cleared to return home ASAP. Social Work Anticipated Follow Up Needs: HH/OP, Support Group Expected length of stay: 4-6 weeks  Clinical Impression Very unfortunate, new mother here following brain hemorrhage and with severe physical, cognitive and communication deficits.  Spouse had to report to [place] training [place] this [place] and pt's mother very concerned about beginning process to bring him back home.  I have contacted the [employer] immediately after meeting mother to begin the process for his deferment due to medical issues.  Family with good awareness of pt's expected care needs at d/c.  Will need ongoing ABI education and support.  Team anticipating a lengthy CIR stay.    Tamani Durney 05/22/2016, 2:45 PM

## 2016-05-25 NOTE — Progress Notes (Signed)
[facility] PHYSICAL MEDICINE & REHABILITATION     PROGRESS NOTE    Subjective/Complaints: Headaches a little more pronounced. Left leg pain  ROS remains limited by language   Objective: Vital Signs: Blood pressure 110/65, pulse 65, temperature 98.1 F (36.7 C), temperature source Oral, resp. rate 18, height 5\' 4"  (1.626 m), weight 84.7 kg (186 lb 12.8 oz), last menstrual period 05/01/2016, SpO2 98 %. No results found. No results for input(s): WBC, HGB, HCT, PLT in the last 72 hours. No results for input(s): NA, K, CL, GLUCOSE, BUN, CREATININE, CALCIUM in the last 72 hours.  Invalid input(s): CO CBG (last 3)   Recent Labs  05/25/16 0028 05/25/16 0702 05/25/16 1123  GLUCAP 98 99 103*    Wt Readings from Last 3 Encounters:  05/20/16 84.7 kg (186 lb 12.8 oz)  05/20/16 84.4 kg (186 lb 1.6 oz)    Physical Exam:  Constitutional: She appears well-developed and well-nourished.  HENT:  Mouth/Throat: Oropharynx is clear and moist.  Right crani incision well healed, area clean.   no abnormalities seen Eyes: Conjunctivae are normal. Pupils are equal, round, and reactive to light.    Neck: Neck supple.  Trach stoma closed. Dressing removed Cardiovascular: RRR.   Respiratory: Effort normal and breath sounds normal. Clear bilaterally GI: Soft. Bowel sounds are normal. She exhibits no distension. There is no tenderness.  PEG site clean without drainage.  Musculoskeletal:  Decreased tenderness in extremities  Neurological: She is alert.  Verbal when emotionally engaged. Spastic left hemiparesis with tight left heel cord noted---generally 1/4---pain component. Dysesthesias LUE and LLE which limits exam of these as she became noticeably uncomfortable with basic movements.  Moves RUE/RLE freely  Skin: Skin is warm and dry.  Psychiatric: a little emotionally labile. Was tearful for quite awhile after I removed  Dressing from neck.   Assessment/Plan: 1. Dense left hemiparesis and  aphasia secondary to right frontal IPH/IVH which require 3+ hours per day of interdisciplinary therapy in a comprehensive inpatient rehab setting. Physiatrist is providing close team supervision and 24 hour management of active medical problems listed below. Physiatrist and rehab team continue to assess barriers to discharge/monitor patient progress toward functional and medical goals.  Function:  Bathing Bathing position   Position: Bed  Bathing parts Body parts bathed by patient: Chest, Front perineal area, Abdomen, Left arm Body parts bathed by helper: Right arm, Buttocks, Right upper leg, Left upper leg, Right lower leg, Left lower leg, Back  Bathing assist Assist Level: 2 helpers      Upper Body Dressing/Undressing Upper body dressing   What is the patient wearing?: Pull over shirt/dress     Pull over shirt/dress - Perfomed by patient: Put head through opening Pull over shirt/dress - Perfomed by helper: Thread/unthread right sleeve, Thread/unthread left sleeve, Put head through opening, Pull shirt over trunk        Upper body assist Assist Level:  (Max A)      Lower Body Dressing/Undressing Lower body dressing   What is the patient wearing?: Pants, Socks, Shoes       Pants- Performed by helper: Thread/unthread right pants leg, Thread/unthread left pants leg, Pull pants up/down       Socks - Performed by helper: Don/doff right sock, Don/doff left sock   Shoes - Performed by helper: Don/doff right shoe, Don/doff left shoe, Fasten right, Fasten left          Lower body assist Assist for lower body dressing: 2 Helpers  Toileting Toileting Toileting activity did not occur: Safety/medical concerns   Toileting steps completed by helper: Adjust clothing prior to toileting, Performs perineal hygiene, Adjust clothing after toileting    Toileting assist Assist level: Two helpers   Transfers Chair/bed transfer   Chair/bed transfer method: Lateral scoot Chair/bed  transfer assist level: 2 helpers Chair/bed transfer assistive device: Armrests Mechanical lift: Maximove   Locomotion Ambulation Ambulation activity did not occur: Safety/medical concerns         Wheelchair   Type: Manual   Assist Level: Dependent (Pt equals 0%)  Cognition Comprehension Comprehension assist level: Understands basic 50 - 74% of the time/ requires cueing 25 - 49% of the time  Expression Expression assist level: Expresses basic 50 - 74% of the time/requires cueing 25 - 49% of the time. Needs to repeat parts of sentences.  Social Interaction Social Interaction assist level: Interacts appropriately 50 - 74% of the time - May be physically or verbally inappropriate.  Problem Solving Problem solving assist level: Solves basic 25 - 49% of the time - needs direction more than half the time to initiate, plan or complete simple activities  Memory Memory assist level: Recognizes or recalls 25 - 49% of the time/requires cueing 50 - 75% of the time     Medical Problem List and Plan: 1.  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  -continue PT/OT/SLP. Pt progressing  -family appears supportive-reassured them that what we're experiencing is to be expected given location of her injury 2.  DVT Prophylaxis/Anticoagulation: Mechanical: Sequential compression devices, below knee Bilateral lower extremities. LLE dopplers 12/26 negative for DVT 3. Headaches/Pain Management:   - continue gabapentin for headaches and dysesthesias --increase to 200mg  TID   -increase hydrocodone to 10mg   -behavioral mgt, perseverative component    4. Mood: LCSW to follow for evaluation and support when appropriate.  5. Neuropsych: This patient is not capable of making decisions on her own behalf. 6. Skin/Wound Care: routine pressure relief measures.  7. Fluids/Electrolytes/Nutrition: Bolus TF's.  NPO for now.  -     8. VDRF:decannulated  without issue, trach stoma closed 9. New onset seizures: Continue Keppra bid.  10. Spasticity/Neuropathy LUE/LLE: see above  -added low dose baclofen 5mg  BID, will likely need to increase 11. E Coli UTI---sens to cipro--continue  .  LOS (Days) 5 A FACE TO FACE EVALUATION WAS PERFORMED  Faith Rogue T, MD 05/25/2016 1:32 PM

## 2016-05-26 ENCOUNTER — Inpatient Hospital Stay (HOSPITAL_COMMUNITY): Payer: Managed Care, Other (non HMO) | Admitting: Physical Therapy

## 2016-05-26 ENCOUNTER — Inpatient Hospital Stay (HOSPITAL_COMMUNITY): Payer: Managed Care, Other (non HMO) | Admitting: Speech Pathology

## 2016-05-26 ENCOUNTER — Inpatient Hospital Stay (HOSPITAL_COMMUNITY): Payer: Managed Care, Other (non HMO) | Admitting: Occupational Therapy

## 2016-05-26 LAB — GLUCOSE, CAPILLARY
GLUCOSE-CAPILLARY: 101 mg/dL — AB (ref 65–99)
GLUCOSE-CAPILLARY: 102 mg/dL — AB (ref 65–99)
GLUCOSE-CAPILLARY: 110 mg/dL — AB (ref 65–99)
GLUCOSE-CAPILLARY: 111 mg/dL — AB (ref 65–99)
Glucose-Capillary: 108 mg/dL — ABNORMAL HIGH (ref 65–99)

## 2016-05-26 MED ORDER — BACLOFEN 5 MG HALF TABLET
5.0000 mg | ORAL_TABLET | Freq: Three times a day (TID) | ORAL | Status: DC
  Administered 2016-05-26 – 2016-06-01 (×16): 5 mg via ORAL
  Filled 2016-05-26 (×17): qty 1

## 2016-05-26 NOTE — Progress Notes (Signed)
Physical Therapy Note  Patient Details  Name: Ned ClinesLaura Kerrick MRN: [id number] Date of Birth: 11/08/1990 Today's Date: 05/26/2016    Time: 900-925 25 minutes  1:1 Pt with no c/o pain at PT arrival. Pt engaged in seated activity for attention and problem solving. Pt attempt follow picture to make peg design, pt requires mod/max cuing to attend to task and to follow design. Pt with noticeable Lt inattention. Pt able to perform task <30 seconds before requiring redirection. Pt then with c/o Lt LE pain, attempted repositioning and doffed shoe. RN made aware. Pt distracted by pain and unable to continue peg task. Pt left in room with RN present.   Harinder Romas 05/26/2016, 12:57 PM

## 2016-05-26 NOTE — Progress Notes (Signed)
Occupational Therapy Session Note  Patient Details  Name: Cheryl ClinesLaura Gibbs MRN: [id number] Date of Birth: 06/16/1990  Today's Date: 05/26/2016 OT Individual Time: 0800-0900 OT Individual Time Calculation (min): 60 min     Short Term Goals: Week 1:  OT Short Term Goal 1 (Week 1): Pt will be able to sit at EOB for 5 minutes with mod A to maintain balance. OT Short Term Goal 2 (Week 1): Pt will tolerate self bathing her LUE. OT Short Term Goal 3 (Week 1): Pt will don a shirt with mod A. OT Short Term Goal 4 (Week 1): Pt will sit to stand with 1 person A with max A. OT Short Term Goal 5 (Week 1): Pt will use R hand for self ROM of L fingers.  Skilled Therapeutic Interventions/Progress Updates:   1:1 Husband present for session and a huge encouragement and motivator.    LB dressing in the bed with decr sensitivity in left LE allowing therapist to bend LE at the knee and threading pant leg. [facility] pt came to EOB with mod to max A with bed rail with facilitation to advance left hip forward. Pt required mod to max A to maintain sitting balance at EOB. Performed slide board transfer with max A +2 but with pt's increased ability to assist with maintaining a forward weight shift and trying to assist with pushing self towards the w/c.  Performed oral care with suction tooth brush with setup and min cues. Pt assist with donning over the head shirt with mod A with demonstrative cues to thread left sleeve.   Transitioned to the gym.  Performed standing int he standing frame 2x for ~5 min each (with left air cast donned). Pt able to achieve fully upright truck posture and sustain position for ~15 seconds at a time to sign to husband and take pictures.   Return to w/c to rest with left UE supported with quick release donned.   Therapy Documentation Precautions:  Precautions Precautions: Fall Precaution Comments: helmet when OOB Restrictions Weight Bearing Restrictions: No General:   Vital  Signs:   Pain:  soreness in left UE-limited ROM   See Function Navigator for Current Functional Status.   Therapy/Group: Individual Therapy  Roney MansSmith, Rashanda Magloire [facility] 05/26/2016, 10:40 AM

## 2016-05-26 NOTE — Progress Notes (Signed)
Speech Language Pathology Daily Session Note  Patient Details  Name: Ned ClinesLaura Delavega MRN: [id number] Date of Birth: 10/22/1990  Today's Date: 05/26/2016 SLP Individual Time: 0930-1030 SLP Individual Time Calculation (min): 60 min   Short Term Goals: Week 1: SLP Short Term Goal 1 (Week 1): Patient will scan to left field of enviornment to locate specific objects, etc with Mod A verbal and visual cues.  SLP Short Term Goal 2 (Week 1): Patient will demonstrate sustained attention to functional tasks for 2 minutes with Mod A verbal cues.  SLP Short Term Goal 3 (Week 1): Patient will vocalize on command in 25% of opportunities with Max A multimodal cues.  SLP Short Term Goal 4 (Week 1): Patient will demonstrate reading comprehension at the phrase level with 75% accuracy with Mod A multimodal cues.  SLP Short Term Goal 5 (Week 1): Patient will consume PO trials with minimal overt s/s of aspiration and initiate a swallow response in 100% of trials with Min A verbal cues over 2 sessions to assess readiness for an objective swallow study.   Skilled Therapeutic Interventions: Skilled treatment session focused on functional communication. SLP facilitated session by providing extra time and Mod A verbal and question cues for patient to self-monitor and correct errors during written expression when expressing her basic wants/needs. Patient was independently oriented to place, situation and month/date but required Mod A question cues for orientation to year. Patient answering biographical questions utilizing written expression at the word and phrase level. Patient unable to locate herself in familiar family picture without total A due to left visual deficits/inattention. Patient without attempts to vocalize with minimal participate in vocalization tasks despite Max A multimodal cues and multiple attempts. Patient provided encouragement in regards to the importance of trying to participate with treatment even though it  is difficult and frustrating.  Patient left upright in wheelchair with all needs within reach. Continue with current plan of care.   Function:  Eating Eating                 Cognition Comprehension Comprehension assist level: Understands basic 50 - 74% of the time/ requires cueing 25 - 49% of the time  Expression Expression assistive device: Communication board Expression assist level: Expresses basic 50 - 74% of the time/requires cueing 25 - 49% of the time. Needs to repeat parts of sentences.  Social Interaction Social Interaction assist level: Interacts appropriately 25 - 49% of time - Needs frequent redirection.  Problem Solving Problem solving assist level: Solves basic 25 - 49% of the time - needs direction more than half the time to initiate, plan or complete simple activities  Memory Memory assist level: Recognizes or recalls 25 - 49% of the time/requires cueing 50 - 75% of the time    Pain Pain Assessment Pain Assessment: No/denies pain Pain Score: 0-No pain Pain Type: Acute pain Pain Location: Foot Pain Orientation: Left Pain Descriptors / Indicators: Aching;Discomfort Pain Frequency: Intermittent Pain Onset: On-going Pain Intervention(s): Medication (See eMAR)  Therapy/Group: Individual Therapy  Braydee Shimkus 05/26/2016, 3:00 PM

## 2016-05-26 NOTE — Progress Notes (Signed)
Physical Therapy Session Note  Patient Details  Name: Cheryl ClinesLaura Mazon MRN: [id number] Date of Birth: 02/19/1991  Today's Date: 05/26/2016 PT Individual Time: 1105-1200 PT Individual Time Calculation (min): 55 min    Short Term Goals: Week 1:  PT Short Term Goal 1 (Week 1): pt will perform sitting balance with mod A PT Short Term Goal 2 (Week 1): pt will assist with functional transfers 25%  Skilled Therapeutic Interventions/Progress Updates:    Pt received in w/c noting 7/10 pain in head (RN aware & pt premedicated). Pt reluctantly agreeable to tx noting she is tired following all therapies this morning. Pt reported pins & needles sensation in LUE/LE with touch but agreeable to wearing L shoe. Pt required total assist to don L aircast & shoe. Pt completed sit<>stand transfers with max assist with rail in hallway & tolerated standing ~10 seconds at a time with max assist. Pt able to shift pelvis forward and upright trunk for improved posture but pt presents with tight L heel cord and inability to reach neutral alignment even in standing. Pt utilized dynavision from w/c level with focus on shifting trunk anteriorly & attention to L. Pt requires multimodal cuing to attend to lights on left & reports a blind spot in L eye. Pt transported back to room, therapist provided total assist for w/c & slide board set up. Pt able to assist with weight shifting across board to R but required multimodal cuing for posture, hand placement & sequencing. At end of session pt left in bed with 4 rails up (per family request), L PRAFO donned & all needs within reach.   Therapy Documentation Precautions:  Precautions Precautions: Fall Precaution Comments: helmet when OOB Restrictions Weight Bearing Restrictions: No   See Function Navigator for Current Functional Status.   Therapy/Group: Individual Therapy  Sandi MariscalVictoria M Lemario Chaikin 05/26/2016, 12:48 PM

## 2016-05-26 NOTE — Progress Notes (Signed)
PHYSICAL MEDICINE & REHABILITATION     PROGRESS NOTE    Subjective/Complaints: Still having issues with helmet. Slept well last night. Crani site can fluctuate in appearance.   ROS: limited by aphasia   Objective: Vital Signs: Blood pressure 105/62, pulse 62, temperature 98.4 F (36.9 C), temperature source Oral, resp. rate 16, height 5\' 4"  (1.626 m), weight 84.7 kg (186 lb 12.8 oz), last menstrual period 05/01/2016, SpO2 95 %. No results found. No results for input(s): WBC, HGB, HCT, PLT in the last 72 hours. No results for input(s): NA, K, CL, GLUCOSE, BUN, CREATININE, CALCIUM in the last 72 hours.  Invalid input(s): CO CBG (last 3)   Recent Labs  05/25/16 1734 05/26/16 0026 05/26/16 0550  GLUCAP 118* 111* 110*    Wt Readings from Last 3 Encounters:  05/20/16 84.7 kg (186 lb 12.8 oz)  05/20/16 84.4 kg (186 lb 1.6 oz)    Physical Exam:  Constitutional: She appears well-developed and well-nourished.  HENT:  Mouth/Throat: Oropharynx is clear and moist.  Right crani incision well healed, area clean.   no obvious abnormalities seen Eyes: Conjunctivae are normal. Pupils are equal, round, and reactive to light.    Neck: Neck supple.  Trach stoma closed.   Cardiovascular: RRR.   Respiratory: Effort normal and breath sounds normal. Clear bilaterally GI: Soft. Bowel sounds are normal. She exhibits no distension. There is no tenderness.  PEG site clean without drainage.  Musculoskeletal:  Decreased tenderness in extremities  Neurological: She is alert.   Spastic left hemiparesis with tight left heel cord noted---generally 1/4---pain component. Dysesthesias LUE and LLE which limits exam of these as she became noticeably uncomfortable with basic movements.  Moves RUE/RLE freely  Skin: Skin is warm and dry.  Psychiatric: sleeping soundly upon my arrival. Hard to awaken today  Assessment/Plan: 1. Dense left hemiparesis and aphasia secondary to right frontal  IPH/IVH which require 3+ hours per day of interdisciplinary therapy in a comprehensive inpatient rehab setting. Physiatrist is providing close team supervision and 24 hour management of active medical problems listed below. Physiatrist and rehab team continue to assess barriers to discharge/monitor patient progress toward functional and medical goals.  Function:  Bathing Bathing position   Position: Bed  Bathing parts Body parts bathed by patient: Chest, Front perineal area, Abdomen, Left arm Body parts bathed by helper: Right arm, Buttocks, Right upper leg, Left upper leg, Right lower leg, Left lower leg, Back  Bathing assist Assist Level: 2 helpers      Upper Body Dressing/Undressing Upper body dressing   What is the patient wearing?: Pull over shirt/dress     Pull over shirt/dress - Perfomed by patient: Put head through opening Pull over shirt/dress - Perfomed by helper: Thread/unthread right sleeve, Thread/unthread left sleeve, Put head through opening, Pull shirt over trunk        Upper body assist Assist Level:  (Max A)      Lower Body Dressing/Undressing Lower body dressing   What is the patient wearing?: Pants, Socks, Shoes       Pants- Performed by helper: Thread/unthread right pants leg, Thread/unthread left pants leg, Pull pants up/down       Socks - Performed by helper: Don/doff right sock, Don/doff left sock   Shoes - Performed by helper: Don/doff right shoe, Don/doff left shoe, Fasten right, Fasten left          Lower body assist Assist for lower body dressing: 2 Helpers  Toileting Toileting Toileting activity did not occur: No continent bowel/bladder event   Toileting steps completed by helper: Adjust clothing prior to toileting, Performs perineal hygiene, Adjust clothing after toileting    Toileting assist Assist level: Two helpers   Transfers Chair/bed transfer   Chair/bed transfer method: Lateral scoot Chair/bed transfer assist level: 2  helpers Chair/bed transfer assistive device: Armrests Mechanical lift: Maximove   Locomotion Ambulation Ambulation activity did not occur: Safety/medical concerns         Wheelchair   Type: Manual   Assist Level: Dependent (Pt equals 0%)  Cognition Comprehension Comprehension assist level: Understands basic 50 - 74% of the time/ requires cueing 25 - 49% of the time  Expression Expression assist level: Expresses basic 50 - 74% of the time/requires cueing 25 - 49% of the time. Needs to repeat parts of sentences.  Social Interaction Social Interaction assist level: Interacts appropriately 25 - 49% of time - Needs frequent redirection.  Problem Solving Problem solving assist level: Solves basic 25 - 49% of the time - needs direction more than half the time to initiate, plan or complete simple activities  Memory Memory assist level: Recognizes or recalls 25 - 49% of the time/requires cueing 50 - 75% of the time     Medical Problem List and Plan: 1.  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  -continue PT/OT/SLP. Team conference today  -will request soft helmet 2.  DVT Prophylaxis/Anticoagulation: Mechanical: Sequential compression devices, below knee Bilateral lower extremities. LLE dopplers 12/26 negative for DVT 3. Headaches/Pain Management:   - continue gabapentin 200mg  TID for headaches and dysesthesias     -increased hydrocodone to 10mg   -behavioral mgt, perseverative component    4. Mood: LCSW to follow for evaluation and support when appropriate.  5. Neuropsych: This patient is not capable of making decisions on her own behalf. 6. Skin/Wound Care: routine pressure relief measures.  7. Fluids/Electrolytes/Nutrition: Bolus TF's. maintain NPO for now.  -     8. VDRF:decannulated without issue, trach stoma closed 9. New onset seizures: Continue Keppra bid.  10. Spasticity/Neuropathy LUE/LLE: see  above  -added low dose baclofen 5mg  BID, will  Increase to TID 11. E Coli UTI---sens to cipro--continue  .  LOS (Days) 6 A FACE TO FACE EVALUATION WAS PERFORMED  Ranelle Oyster, MD 05/26/2016 8:58 AM

## 2016-05-27 ENCOUNTER — Inpatient Hospital Stay (HOSPITAL_COMMUNITY): Payer: Managed Care, Other (non HMO) | Admitting: Physical Therapy

## 2016-05-27 ENCOUNTER — Inpatient Hospital Stay (HOSPITAL_COMMUNITY): Payer: Managed Care, Other (non HMO) | Admitting: *Deleted

## 2016-05-27 ENCOUNTER — Inpatient Hospital Stay (HOSPITAL_COMMUNITY): Payer: Managed Care, Other (non HMO) | Admitting: Occupational Therapy

## 2016-05-27 ENCOUNTER — Inpatient Hospital Stay (HOSPITAL_COMMUNITY): Payer: Managed Care, Other (non HMO) | Admitting: Speech Pathology

## 2016-05-27 LAB — BASIC METABOLIC PANEL
Anion gap: 13 (ref 5–15)
BUN: 15 mg/dL (ref 6–20)
CHLORIDE: 103 mmol/L (ref 101–111)
CO2: 24 mmol/L (ref 22–32)
Calcium: 9.5 mg/dL (ref 8.9–10.3)
Creatinine, Ser: 0.65 mg/dL (ref 0.44–1.00)
GFR calc Af Amer: >60 mL/min (ref >60)
GFR calc non Af Amer: >60 mL/min (ref >60)
Glucose, Bld: 89 mg/dL (ref 65–99)
POTASSIUM: 3.8 mmol/L (ref 3.5–5.1)
SODIUM: 140 mmol/L (ref 135–145)

## 2016-05-27 LAB — CBC
HEMATOCRIT: 35.8 % — AB (ref 36.0–46.0)
Hemoglobin: 11.3 g/dL — ABNORMAL LOW (ref 12.0–15.0)
MCH: 28.3 pg (ref 26.0–34.0)
MCHC: 31.6 g/dL (ref 30.0–36.0)
MCV: 89.5 fL (ref 78.0–100.0)
Platelets: 329 10*3/uL (ref 150–400)
RBC: 4 MIL/uL (ref 3.87–5.11)
RDW: 15.1 % (ref 11.5–15.5)
WBC: 6.4 10*3/uL (ref 4.0–10.5)

## 2016-05-27 LAB — GLUCOSE, CAPILLARY
GLUCOSE-CAPILLARY: 92 mg/dL (ref 65–99)
Glucose-Capillary: 98 mg/dL (ref 65–99)
Glucose-Capillary: 91 mg/dL (ref 65–99)
Glucose-Capillary: 115 mg/dL — ABNORMAL HIGH (ref 65–99)

## 2016-05-27 MED ORDER — GABAPENTIN 250 MG/5ML PO SOLN
300.0000 mg | Freq: Three times a day (TID) | ORAL | Status: DC
  Administered 2016-05-27 – 2016-06-04 (×23): 300 mg via ORAL
  Filled 2016-05-27 (×28): qty 6

## 2016-05-27 NOTE — Progress Notes (Signed)
Occupational Therapy Session Note  Patient Details  Name: Cheryl ClinesLaura Gibbs MRN: [id number] Date of Birth: 06/05/1990  Today's Date: 05/27/2016 OT Individual Time: [id number] OT Individual Time Calculation (min): 64 min     Short Term Goals: Week 1:  OT Short Term Goal 1 (Week 1): Pt will be able to sit at EOB for 5 minutes with mod A to maintain balance. OT Short Term Goal 2 (Week 1): Pt will tolerate self bathing her LUE. OT Short Term Goal 3 (Week 1): Pt will don a shirt with mod A. OT Short Term Goal 4 (Week 1): Pt will sit to stand with 1 person A with max A. OT Short Term Goal 5 (Week 1): Pt will use R hand for self ROM of L fingers.   Pt seen for ADL training and family education with pt's spouse.  Pt received in bed and stated she needed to toilet. Used slide board with +2 helpers with total A.  Clothing doffed and donned from bed with a thick bed pad placed between pt and board.  Once on commode pt with significant L lean, attempted to support balance with pillows. Once back on bed, spouse assisted therapist with working on lateral scoots to L to [facility].  Pt crying with mobility but calmed down after short rest. Worked on rolling to clean up/donn pants. Pt with discomfort with LLE knee bent.  Pt worked on sitting EOB but needed total A as she was not able to engage trunk muscles to support balance. Used board to w/c. Pt set up in w/c with pillow support under L arm.  Pt in chair with spouse by her side.   Therapy Documentation Precautions:  Precautions Precautions: Fall Precaution Comments: helmet when OOB Restrictions Weight Bearing Restrictions: No   Pain: Pain Assessment Pain Assessment: Faces Faces Pain Scale: Hurts whole lot Pain Type: Acute pain Pain Location: Leg Pain Orientation: Left Pain Descriptors / Indicators: Grimacing Pain Onset: With Activity ADL: ADL ADL Comments: refer to navigator See Function Navigator for Current Functional  Status.   Therapy/Group: Individual Therapy  SAGUIER,JULIA 05/27/2016, 12:19 PM

## 2016-05-27 NOTE — Progress Notes (Addendum)
Physical Therapy Session Note  Patient Details  Name: Cheryl Gibbs MRN: [id number] Date of Birth: 07/15/1990  Today's Date: 05/27/2016 PT Individual Time: 1603-1700 PT Individual Time Calculation (min): 57 min    Short Term Goals: Week 1:  PT Short Term Goal 1 (Week 1): pt will perform sitting balance with mod A PT Short Term Goal 2 (Week 1): pt will assist with functional transfers 25%  Skilled Therapeutic Interventions/Progress Updates:    Pt received asleep in bed with mother [name] present. Pt awakened & agreeable to tx with husband arriving shortly after for session. Discussed importance of attempting to communicate verbally with therapists with pt repeatedly writing "I can" on dry erase board but not attempting to verbally communicate. Provided total assist for donning shoes, with LLE air cast already donned. Pt transferred supine>sitting EOB with +2 assist as pt has difficulty uprighting trunk 2/2 weak core muscles. Provided total assist for w/c & slide board set up. Pt required +2 assist to complete lateral scoot bed>w/c on L with therapist providing multimodal cuing for sequencing & technique with assistance to weight shift across board. Pt with poor anterior weight shift & head/hips relationship. Pt transferred sit<>stand at rail in hallway with Max assist and multimodal cuing & facilitation for upright trunk, anterior pelvis, & approximation at L knee to prevent buckling. With +2 assist and additional w/c follow for safety pt able to take 3 steps at rail in hallway with assistance to advance LLE, approximate at L knee to prevent buckling, cuing & facilitation for weight shifting, and cuing for sequencing. For 2nd gait attempt therapist donned dorsiflexion ace wrap to L ankle to provide stability. Gait training x 5 additional steps with pt crying in pain at end of trial but pt with improved ability to weight bear through LLE to clear R foot from floor and advance RLE. RN made aware of pt's  behaviors demonstrating pain. At end of session pt left in TIS w/c in room with QRB donned, pancake call bell within reach, & family (mother & husband) present to supervise.   Pt would benefit from gait trial without shoes (non-skid socks instead) due to hypersensitivity in LLE.   Therapy Documentation Precautions:  Precautions Precautions: Fall Precaution Comments: helmet when OOB Restrictions Weight Bearing Restrictions: No   See Function Navigator for Current Functional Status.   Therapy/Group: Individual Therapy  Sandi MariscalVictoria M Burton Gahan 05/27/2016, 5:50 PM

## 2016-05-27 NOTE — Evaluation (Signed)
Recreational Therapy Assessment and Plan  Patient Details  Name: Cheryl Gibbs MRN: [id number] Date of Birth: 27-Nov-1990 Today's Date: 05/27/2016  Rehab Potential: Good ELOS: 3 weeks   Assessment Problem List: Patient Active Problem List   Diagnosis Date Noted  . Intraparenchymal hematoma of brain ([place]) 05/20/2016  . Global aphasia   . Spastic hemiparesis affecting nondominant side ([employer])   . Vascular headache   . Dysphagia, post-stroke   . Tracheostomy status ([place])   . Seizure ([place])   . Neuropathy ([place])   . Nonverbal   . Spastic hemiplegia affecting nondominant side ([facility])     Past Medical History:      Past Medical History:  Diagnosis Date  . ADD (attention deficit disorder)   . Occipital neuralgia    Past Surgical History:       Past Surgical History:  Procedure Laterality Date  . CESAREAN SECTION  03/31/2016    Assessment & Plan Clinical Impression: Patient is a 26 y.o. year old female with transferred to [facility] from OSH on 04/11/16 after being found unresponsive in by husband. History taken from chart review and mother. Patient in bathtub--not submerged and activity seizing. 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 She underwent cerebral angiogram and decline neurologically shortly after admission to ICU. She was taken to OR emergently for right craniotomy for evacuation of IPH and obliteration of ruptured lenticulostriate aneurysm and placement of EVD. She was place on Keppra for seizure prophylaxis and her hospital course significant for VDRF with sepsis, diffuse vasospasms and difficulty with vent wean requiring Trach and PEG on 11/22..  Patient transferred to CIR on 05/20/2016.   Pt presents with decreased activity tolerance, decreased functional mobility,  decreased balance, decreased coordination, decreased attention, decreased awareness, decreased safety awareness and delayed processing Limiting pt's independence with leisure/community pursuits.   Leisure History/Participation Premorbid leisure interest/current participation: [employer];Community - Travel (Comment);Community - Other (Comment);Sports - Racing;Sports - Basketball;Sports - Building services engineer (DRAG/STREET RACING; WORKING ON CAR; [employer]) Expression Interests: Music (Comment) Other Leisure Interests: Television;Movies;Computer Leisure Participation Style: With Family/Friends Awareness of Community Resources: Excellent Psychosocial / Spiritual Patient agreeable to Pet Therapy: Yes Does patient have pets?: Yes Social interaction - Mood/Behavior: Cooperative Academic librarian Appropriate for Education?: Yes Strengths/Weaknesses Patient Strengths/Abilities: Willingness to participate;Active premorbidly Patient weaknesses: Physical limitations TR Patient demonstrates impairments in the following area(s): Edema;Endurance;Motor;Pain;Safety  Plan Rec Therapy Plan Is patient appropriate for Therapeutic Recreation?: Yes Rehab Potential: Good Treatment times per week: Min 1 time per week >20 minutes Estimated Length of Stay: 3 weeks TR Treatment/Interventions: Adaptive equipment instruction;1:1 session;Balance/vestibular training;Functional mobility training;Community reintegration;Cognitive remediation/compensation;Group participation (Comment);Patient/family education;Therapeutic activities;Recreation/leisure participation;Provide activity resources in room;Therapeutic exercise;UE/LE Coordination activities;Wheelchair propulsion/positioning  Recommendations for other services: None   Discharge Criteria: Patient will be discharged from TR if patient refuses treatment 3 consecutive times without medical reason.  If treatment goals not met, if  there is a change in medical status, if patient makes no progress towards goals or if patient is discharged from hospital.  The above assessment, treatment plan, treatment alternatives and goals were discussed and mutually agreed upon: by patient  [place] 05/27/2016, 1:20 PM

## 2016-05-27 NOTE — Progress Notes (Signed)
Speech Language Pathology Daily Session Note  Patient Details  Name: Cheryl Gibbs MRN: [id number] Date of Birth: 06/16/1990  Today's Date: 05/27/2016 SLP Individual Time: [id number] SLP Individual Time Calculation (min): 61 min   Short Term Goals: Week 1: SLP Short Term Goal 1 (Week 1): Patient will scan to left field of enviornment to locate specific objects, etc with Mod A verbal and visual cues.  SLP Short Term Goal 2 (Week 1): Patient will demonstrate sustained attention to functional tasks for 2 minutes with Mod A verbal cues.  SLP Short Term Goal 3 (Week 1): Patient will vocalize on command in 25% of opportunities with Max A multimodal cues.  SLP Short Term Goal 4 (Week 1): Patient will demonstrate reading comprehension at the phrase level with 75% accuracy with Mod A multimodal cues.  SLP Short Term Goal 5 (Week 1): Patient will consume PO trials with minimal overt s/s of aspiration and initiate a swallow response in 100% of trials with Min A verbal cues over 2 sessions to assess readiness for an objective swallow study.   Skilled Therapeutic Interventions: Skilled treatment session focused on dysphagia and speech goals. Patient self-fed trials of Dys. 1 textures and demonstrated what appeared to be a timely swallow initiation without overt s/s of aspiration. Patient with poor labial seal and required Max verbal cues to self-monitor and correct left anterior spillage and spoon placement in oral cavity due to impaired motor planning. Patient also consumed trials of thin liquids via cup with intermittent left anterior spillage and overt cough X 1 and use of multiple swallows. Recommend MBS tomorrow to assess swallow function. Patient easily frustrated throughout trials but was redirected with encouragement. Patient unable to voice despite Max A multimodal cues but able to utilize enough breath support and blow a whistle to produce sound X 3 with Max A multimodal cues and assistance from SLP with  labial seal. Patient utilized dry erase board for communication and required extra time and Mod A verbal cues to self-monitor and correct errors. Patient handed off to OT. Continue with current plan of care.   Function:  Eating Eating   Modified Consistency Diet: Yes Eating Assist Level: Supervision or verbal cues;Set up assist for (with trials from SLP)   Eating Set Up Assist For: Opening containers       Cognition Comprehension Comprehension assist level: Understands basic 50 - 74% of the time/ requires cueing 25 - 49% of the time  Expression Expression assistive device: Communication board Expression assist level: Expresses basic 25 - 49% of the time/requires cueing 50 - 75% of the time. Uses single words/gestures.  Social Interaction Social Interaction assist level: Interacts appropriately 25 - 49% of time - Needs frequent redirection.  Problem Solving Problem solving assist level: Solves basic less than 25% of the time - needs direction nearly all the time or does not effectively solve problems and may need a restraint for safety  Memory Memory assist level: Recognizes or recalls 25 - 49% of the time/requires cueing 50 - 75% of the time    Pain Pain Assessment Pain Assessment: Faces Faces Pain Scale: Hurts whole lot Pain Type: Acute pain Pain Location: Leg Pain Orientation: Left Pain Descriptors / Indicators: Grimacing Pain Onset: With Activity  Therapy/Group: Individual Therapy  Cheryl Gibbs 05/27/2016, 3:04 PM

## 2016-05-27 NOTE — Progress Notes (Signed)
Occupational Therapy Session Note  Patient Details  Name: Cheryl Gibbs MRN: [id number] Date of Birth: 10-Dec-1990  Today's Date: 05/27/2016 OT Individual Time: 1100-1130 OT Individual Time Calculation (min): 30 min   Short Term Goals: Week 1:  OT Short Term Goal 1 (Week 1): Pt will be able to sit at EOB for 5 minutes with mod A to maintain balance. OT Short Term Goal 2 (Week 1): Pt will tolerate self bathing her LUE. OT Short Term Goal 3 (Week 1): Pt will don a shirt with mod A. OT Short Term Goal 4 (Week 1): Pt will sit to stand with 1 person A with max A. OT Short Term Goal 5 (Week 1): Pt will use R hand for self ROM of L fingers.  Skilled Therapeutic Interventions/Progress Updates:    1:1 OT session focused on UB bathing/dressing, sequencing, and sitting balance during ADLs. Pt brought to sink in TIS w/c and completed UB bathing/dressing with instructional cues to initiate and sequence task, + set-up A.  Pt unable to tolerate L UE external rotation required to wash R arm, w/ hand-over hand A,  requiring total A to wash R Ue. Worked on sitting balance using mirror feedback to promote midline orientation during bathing. Backwards chaining used for UB dressing task. Max A to thread L Ue, pull over head, and pull down in back. Pt left with needs met and in care of Rec Therapist upon OT departure.   Therapy Documentation Precautions:  Precautions Precautions: Fall Precaution Comments: helmet when OOB Restrictions Weight Bearing Restrictions: No Pain: Pain Assessment Pain Assessment: Faces Faces Pain Scale: Hurts whole lot Pain Type: Acute pain Pain Location: Arm Pain Orientation: Left Pain Descriptors / Indicators: Grimacing Pain Onset: With Activity ADL: ADL ADL Comments: refer to navigator    See Function Navigator for Current Functional Status.   Therapy/Group: Individual Therapy  Valma Cava 05/27/2016, 12:23 PM

## 2016-05-27 NOTE — Patient Care Conference (Signed)
Inpatient RehabilitationTeam Conference and Plan of Care Update Date: 05/26/2016   Time: 2:30 PM    Patient Name: Cheryl Gibbs      Medical Record Number: [id number]  Date of Birth: 08/25/1990 Sex: Female         Room/Bed: 4W16C/4W16C-01 Payor Info: Payor: Monia PouchAETNA / Plan: Derrek GuAETNA MANAGED / Product Type: *No Product type* /    Admitting Diagnosis: Postpartum  Admit Date/Time:  05/20/2016  5:38 PM Admission Comments: No comment available   Primary Diagnosis:  Hemorrhage, intracranial (HCC) Principal Problem: Hemorrhage, intracranial (HCC)  Patient Active Problem List   Diagnosis Date Noted  . Hemorrhage, intracranial (HCC) 05/20/2016  . Global aphasia   . Spastic hemiparesis affecting nondominant side (HCC)   . Vascular headache   . Dysphagia, post-stroke   . Tracheostomy status (HCC)   . Seizure (HCC)   . Neuropathy (HCC)   . Nonverbal     Expected Discharge Date: Expected Discharge Date:  (4+ weeks)  Team Members Present: Physician leading conference: Dr. Faith RogueZachary Gibbs Social Worker Present: Cheryl JupiterLucy Tarrah Furuta, LCSW Nurse Present: Cheryl BaconWhitney Reardon, RN PT Present: Cheryl Gibbs, PT OT Present: Cheryl Gibbs, OT SLP Present: Cheryl Gibbs, SLP PPS Coordinator present : Cheryl DuckMarie Noel, RN, CRRN     Current Status/Progress Goal Weekly Team Focus  Medical   right ICH/aneurysm, trach pt at [facility]. here with dense left hemiparesis and ?aphasia. pain issues. still NPO  improve communication and mobility  pain control, decannulated, nutrition, spasticity mgt   Bowel/Bladder   Pt can be incontinent and continent of bowel  and bladder  To remain continent of bowel/bladder  Continue plan of care   Swallow/Nutrition/ Hydration   NPO with PEG  Min A with least restrictive diet  trials, objective swallow study    ADL's             Mobility   +2 bed mobility, +2 slide board transfers, max assist sit<>stand, tight L heel cord, L inattention  min assist overall  L attention, L NMR, transfers,  bed mobility, activity tolerance   Communication   Nonverbal, uses written expression for communication  Mod I for multimodal communication  voicing, functional communication    Safety/Cognition/ Behavioral Observations  Max A  Min A  problem solving, awareness   Pain   Pain level is 6 out of 10 to left foot/ankle  less than 2  Continue plan of care   Skin   Skin is intact; PEG site is clean/dry/intact  Skin to remian intact  Continue plan of care    Rehab Goals Patient on target to meet rehab goals: Yes *See Care Plan and progress notes for long and short-term goals.  Barriers to Discharge: dense left hemiparesis, language deficits, pain    Possible Resolutions to Barriers:  rx pain and spasticity, continued engagement of left side, adaptive equipment    Discharge Planning/Teaching Needs:  Plan to d/c home to mother's house with spouse, mother and other family providing 24/7 assistance.  Teaching ongoing.   Team Discussion:  New helmet ordered. Communication/ pain/ spasticity issues per MD.  Poor participation with ST. Low threshold for tolerating touch of others.  Hope to do MBS soon, however, issues with dentition.  Standing with max assist.  Overall goals set for min assist.     Revisions to Treatment Plan:  None   Continued Need for Acute Rehabilitation Level of Care: The patient requires daily medical management by a physician with specialized training in physical medicine and rehabilitation for  the following conditions: Daily direction of a multidisciplinary physical rehabilitation program to ensure safe treatment while eliciting the highest outcome that is of practical value to the patient.: Yes Daily medical management of patient stability for increased activity during participation in an intensive rehabilitation regime.: Yes Daily analysis of laboratory values and/or radiology reports with any subsequent need for medication adjustment of medical intervention for :  Neurological problems;Post surgical problems  Cheryl Gibbs 05/27/2016, 2:12 PM

## 2016-05-27 NOTE — Progress Notes (Signed)
Nutrition Follow-up  DOCUMENTATION CODES:   Obesity unspecified  INTERVENTION:  Continue bolus tube feeds via PEG using Osmolite 1.5 formula at goal volume of 300 ml given 4 times daily with 30 ml Prostat once daily per tube.   Tube feeding regimen provides 1900 kcal (100% of needs), 90 grams of protein, and 912 ml of free water.   Continue free water flushes to 200 ml QID given between boluses. Total free water: 1712 ml.  RD to continue to monitor.   NUTRITION DIAGNOSIS:   Inadequate oral intake related to inability to eat as evidenced by NPO status; ongoing  GOAL:   Patient will meet greater than or equal to 90% of their needs; met  MONITOR:   TF tolerance, Labs, Weight trends, Skin, I & O's  REASON FOR ASSESSMENT:   Consult Enteral/tube feeding initiation and management  ASSESSMENT:   26 year old female who was 10 days post partum and  was transferred to [facility] from OSH on 04/11/16  after being found unresponsive by husband.  Patient in bathtub--not submerged and activity seizing. 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  She underwent cerebral angiogram and decline neurologically shortly after admission to ICU. She was taken to OR emergently for right craniotomy for evacuation of IPH and obliteration of ruptured lenticulostriate aneurysm and placement of EVD. Trach and PEG were placed on 11/22.  She was admitted to [facility]) on 05/01/16. She is non verbal.  Pt has been tolerating her bolus tube feeds. RD to continue with current orders. Pt communicates using a white board. Pt reports L foot and leg pain. RN aware. Will continue to monitor. Labs and medications reviewed.   Diet Order:  Diet NPO time specified  Skin:   (Incision on head R)  Last BM:   1/2  Height:   Ht Readings from Last 1 Encounters:  05/20/16 _0  (1.626 m)    Weight:   Wt Readings from Last 1 Encounters:  05/20/16 186 lb 12.8 oz (84.7 kg)    Ideal Body Weight:  54.5 kg  BMI:  Body mass index is 32.06 kg/m.  Estimated Nutritional Needs:   Kcal:  1850-2050  Protein:  90-105 grams  Fluid:  1.8 - 2 L/day  EDUCATION NEEDS:   No education needs identified at this time  Corrin Parker, MS, RD, LDN Pager # [phone] After hours/ weekend pager # [phone]

## 2016-05-27 NOTE — Care Management Note (Signed)
Inpatient Rehabilitation Center Individual Statement of Services  Patient Name:  Cheryl ClinesLaura Baiz  Date:  05/22/2017  Welcome to the Inpatient Rehabilitation Center.  Our goal is to provide you with an individualized program based on your diagnosis and situation, designed to meet your specific needs.  With this comprehensive rehabilitation program, you will be expected to participate in at least 3 hours of rehabilitation therapies Monday-Friday, with modified therapy programming on the weekends.  Your rehabilitation program will include the following services:  Physical Therapy (PT), Occupational Therapy (OT), Speech Therapy (ST), 24 hour per day rehabilitation nursing, Therapeutic Recreaction (TR), Neuropsychology, Case Management (Social Worker), Rehabilitation Medicine, Nutrition Services and Pharmacy Services  Weekly team conferences will be held on Tuesdays to discuss your progress.  Your Social Worker will talk with you frequently to get your input and to update you on team discussions.  Team conferences with you and your family in attendance may also be held.  Expected length of stay: 4+ weeks  Overall anticipated outcome: minimal assistance  Depending on your progress and recovery, your program may change. Your Social Worker will coordinate services and will keep you informed of any changes. Your Social Worker's name and contact numbers are listed  below.  The following services may also be recommended but are not provided by the Inpatient Rehabilitation Center:   Driving Evaluations  Home Health Rehabiltiation Services  Outpatient Rehabilitation Services  Vocational Rehabilitation   Arrangements will be made to provide these services after discharge if needed.  Arrangements include referral to agencies that provide these services.  Your insurance has been verified to be:  Community education officer[employer]etna Your primary doctor is:  None  Pertinent information will be shared with your doctor and your insurance  company.  Social Worker:  [place] LoganLucy Carlyn Mullenbach, [place] [phone] or ([phone]) [phone]   Information discussed with and copy given to patient by: Amada JupiterHOYLE, Emmalyne Giacomo, 05/22/2017, 10:01 AM

## 2016-05-27 NOTE — Progress Notes (Signed)
Social Work Patient ID: Cheryl Gibbs, female   DOB: 1990/07/29, 26 y.o.   MRN: [id number]  Met with pt and spouse yesterday afternoon to review team conference.  Pt non-verbal but does give a "thumbs up" when we spoke about LOS @ 4+ weeks.  Smiling easily at spouse' jokes and asking him if they can "cuddle".  Spouse thankful that we were able to work with [employer] to get him home for a few days from basic training but he does have to return on 1/8 and complete a 14 week course.  He is very optimistic about pt's recovery and will need further education on longer term planning for pt's care.  Will continue to follow for support and d/c planning.  Anuoluwapo Mefferd, LCSW

## 2016-05-27 NOTE — Progress Notes (Signed)
[place] PHYSICAL MEDICINE & REHABILITATION     PROGRESS NOTE    Subjective/Complaints: Slept well. Asleep when I arrived. I had a difficult time trying to awaken her.   ROS: limited by language   Objective: Vital Signs: Blood pressure 92/61, pulse 75, temperature 98.5 F (36.9 C), temperature source Oral, resp. rate 18, height 5\' 4"  (1.626 m), weight 84.7 kg (186 lb 12.8 oz), last menstrual period 05/01/2016, SpO2 97 %. No results found.  Recent Labs  05/27/16 0659  WBC 6.4  HGB 11.3*  HCT 35.8*  PLT 329    Recent Labs  05/27/16 0659  NA 140  K 3.8  CL 103  GLUCOSE 89  BUN 15  CREATININE 0.65  CALCIUM 9.5   CBG (last 3)   Recent Labs  05/26/16 1911 05/27/16 0002 05/27/16 0601  GLUCAP 108* 102* 98    Wt Readings from Last 3 Encounters:  05/20/16 84.7 kg (186 lb 12.8 oz)  05/20/16 84.4 kg (186 lb 1.6 oz)    Physical Exam:  Constitutional: She appears well-developed and well-nourished.  HENT:  Mouth/Throat: Oropharynx is clear and moist.  Right crani incision well healed, area clean.    Eyes: Conjunctivae are normal. Pupils are equal, round, and reactive to light.    Neck: Neck supple.  Trach stoma closed.   Cardiovascular: RRR.   Respiratory: CTA  GI: Soft. Bowel sounds are normal. She exhibits no distension. There is no tenderness.  PEG site clean without drainage.  Musculoskeletal:  Decreased tenderness in extremities  Neurological: She is alert.   Spastic left hemiparesis with tight left heel cord noted---remains 1/4. Improved dysesthesias LUE and LLE--behavioral component to ROM tolerance.  Moves RUE/RLE freely  Skin: Skin is warm and dry.  Psychiatric: flat  Assessment/Plan: 1. 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 Physiatrist is providing close team supervision and 24 hour management of active medical  problems listed below. Physiatrist and rehab team continue to assess barriers to discharge/monitor patient progress toward functional and medical goals.  Function:  Bathing Bathing position   Position: Bed  Bathing parts Body parts bathed by patient: Chest, Front perineal area, Abdomen, Left arm Body parts bathed by helper: Right arm, Buttocks, Right upper leg, Left upper leg, Right lower leg, Left lower leg, Back  Bathing assist Assist Level: 2 helpers      Upper Body Dressing/Undressing Upper body dressing   What is the patient wearing?: Pull over shirt/dress     Pull over shirt/dress - Perfomed by patient: Put head through opening, Thread/unthread right sleeve Pull over shirt/dress - Perfomed by helper: Thread/unthread left sleeve, Pull shirt over trunk        Upper body assist Assist Level: Touching or steadying assistance(Pt > 75%)      Lower Body Dressing/Undressing Lower body dressing   What is the patient wearing?: Pants, Socks, Shoes, AFO       Pants- Performed by helper: Thread/unthread right pants leg, Thread/unthread left pants leg, Pull pants up/down       Socks - Performed by helper: Don/doff right sock, Don/doff left sock   Shoes - Performed by helper: Don/doff right shoe, Don/doff left shoe, Fasten right, Fasten left   AFO - Performed by helper: Don/doff left AFO (left air cast)      Lower body assist Assist for lower body dressing: 2 Helpers      Toileting  Toileting Toileting activity did not occur: Safety/medical concerns   Toileting steps completed by helper: Adjust clothing prior to toileting, Performs perineal hygiene, Adjust clothing after toileting    Toileting assist Assist level:  (total assist)   Transfers Chair/bed transfer   Chair/bed transfer method: Lateral scoot Chair/bed transfer assist level: 2 helpers Chair/bed transfer assistive device: Sliding board Mechanical lift: Maximove   Locomotion Ambulation Ambulation activity  did not occur: Safety/medical concerns         Wheelchair   Type: Manual   Assist Level: Dependent (Pt equals 0%)  Cognition Comprehension Comprehension assist level: Understands basic 50 - 74% of the time/ requires cueing 25 - 49% of the time  Expression Expression assist level: Expresses basic 50 - 74% of the time/requires cueing 25 - 49% of the time. Needs to repeat parts of sentences.  Social Interaction Social Interaction assist level: Interacts appropriately 25 - 49% of time - Needs frequent redirection.  Problem Solving Problem solving assist level: Solves basic 25 - 49% of the time - needs direction more than half the time to initiate, plan or complete simple activities  Memory Memory assist level: Recognizes or recalls 25 - 49% of the time/requires cueing 50 - 75% of the time     Medical Problem List and Plan: 1.  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  -continue PT/OT/SLP.    -has received soft helmet 2.  DVT Prophylaxis/Anticoagulation: Mechanical: Sequential compression devices, below knee Bilateral lower extremities. LLE dopplers 12/26 negative for DVT 3. Headaches/Pain Management:   -increase gabapentin to 300mg  TID for headaches and dysesthesias     -continue hydrocodone at 10mg   -behavioral mgt, perseverative component to pain and participation in therapy    4. Mood: LCSW to follow for evaluation and support when appropriate.  5. Neuropsych: This patient is not capable of making decisions on her own behalf. 6. Skin/Wound Care: routine pressure relief measures.  7. Fluids/Electrolytes/Nutrition: Bolus TF's. maintain NPO for now.  -     8. VDRF:decannulated without issue, trach stoma closed 9. New onset seizures: Continue Keppra bid.  10. Spasticity/Neuropathy LUE/LLE: see above  -added low dose baclofen 5mg  TID 11. E Coli UTI---sens to cipro--continue for 7 days total  .  LOS (Days) 7 A  FACE TO FACE EVALUATION WAS PERFORMED  Faith RogueSWARTZ,Kali Deadwyler T, MD 05/27/2016 9:09 AM

## 2016-05-28 ENCOUNTER — Inpatient Hospital Stay (HOSPITAL_COMMUNITY): Payer: Managed Care, Other (non HMO) | Admitting: Physical Therapy

## 2016-05-28 ENCOUNTER — Inpatient Hospital Stay (HOSPITAL_COMMUNITY): Payer: Managed Care, Other (non HMO) | Admitting: Occupational Therapy

## 2016-05-28 ENCOUNTER — Inpatient Hospital Stay (HOSPITAL_COMMUNITY): Payer: Managed Care, Other (non HMO) | Admitting: Speech Pathology

## 2016-05-28 ENCOUNTER — Inpatient Hospital Stay (HOSPITAL_COMMUNITY): Payer: Managed Care, Other (non HMO)

## 2016-05-28 LAB — GLUCOSE, CAPILLARY
GLUCOSE-CAPILLARY: 95 mg/dL (ref 65–99)
GLUCOSE-CAPILLARY: 103 mg/dL — AB (ref 65–99)
GLUCOSE-CAPILLARY: 82 mg/dL (ref 65–99)
Glucose-Capillary: 113 mg/dL — ABNORMAL HIGH (ref 65–99)

## 2016-05-28 MED ORDER — OSMOLITE 1.2 CAL PO LIQD
237.0000 mL | Freq: Every day | ORAL | Status: DC
  Administered 2016-05-28: 237 mL
  Filled 2016-05-28 (×2): qty 237

## 2016-05-28 MED ORDER — OSMOLITE 1.5 CAL PO LIQD
300.0000 mL | Freq: Three times a day (TID) | ORAL | Status: DC | PRN
  Administered 2016-05-30 – 2016-06-01 (×2): 300 mL
  Filled 2016-05-28 (×3): qty 474

## 2016-05-28 NOTE — Progress Notes (Signed)
Speech Language Pathology Note  Patient Details  Name: Ned ClinesLaura Kressin MRN: [id number] Date of Birth: 04/18/1991 Today's Date: 05/28/2016  MBSS complete. Full report located under chart review in imaging section.  Results:   Patient demonstrates a moderate oral  and mild pharyngeal sensory based dysphagia. Oral phase is characterized by impaired mastication of solid textures leading to oral expectoration, left anterior spillage of solids and liquids and prolonged AP transit with all trials. Suspect patient's oral phase in regards to mastication of solids was impacted by motivation due to disliking taste of barium. Pharyngeal phase is characterized by delayed swallow initiation and impaired timing/coordination of swallow resulting in silent flash penetration of nectar-thick liquids and silent aspiration of thin liquids. Compensatory strategies were not attempted due to motor planning impairments. Recommend patient initiate a diet of Dys. 1 textures with nectar-thick liquids with full supervision to maximize safety. Discussed results with husband who verbalized understanding.    Lailee Hoelzel 05/28/2016, 3:44 PM

## 2016-05-28 NOTE — Progress Notes (Signed)
Social Work Patient ID: [name], female   DOB: 01/07/1991, 26 y.o.   MRN: [id number]   Alerted by Marissa NestlePam Love, PA that Dr. Lionel DecemberFargen ([facility] wants to have pt [facility] 10:45 am appointment tomorrow due to increasing fluid.  I initially spoke with [facility] about the transport, however, [facility].  [facility] has been set up with likely ~9:30 am pick up tomorrow. Will alert tx team.    Amada JupiterHOYLE, Dakin Madani, LCSW

## 2016-05-28 NOTE — Plan of Care (Signed)
Problem: RH BOWEL ELIMINATION Goal: RH STG MANAGE BOWEL WITH ASSISTANCE STG Manage Bowel with max Assistance.   Outcome: Progressing 2 helpers to change diaper. BM on a daily bases. Goal: RH STG MANAGE BOWEL W/MEDICATION W/ASSISTANCE STG Manage Bowel with Medication with max Assistance.   Outcome: Progressing No bowel medication given during night shift. 2 BM's charted.  Problem: RH BLADDER ELIMINATION Goal: RH STG MANAGE BLADDER WITH ASSISTANCE STG Manage Bladder With max Assistance   Outcome: Progressing Pt letting us know when she is incontinent.   Problem: RH SKIN INTEGRITY Goal: RH STG MAINTAIN SKIN INTEGRITY WITH ASSISTANCE STG Maintain Skin Integrity With max ssistance.   Outcome: Progressing Skin intact, no redness, or open wounds. Goal: RH STG ABLE TO PERFORM INCISION/WOUND CARE W/ASSISTANCE STG Able To Perform Incision/Wound Care With max Assistance.   Outcome: Not Progressing Pt unable to help clean peg site at this time.  Problem: RH PAIN MANAGEMENT Goal: RH STG PAIN MANAGED AT OR BELOW PT'S PAIN GOAL Less than 3 out of 10   Outcome: Progressing PRN pain medication being utilized in managing pain.  Problem: RH KNOWLEDGE DEFICIT BRAIN INJURY Goal: RH STG INCREASE KNOWLEDGE OF DYSPHAGIA/FLUID INTAKE Outcome: Not Progressing Pt would like to have food but says she understands. Ask her husband to bring her food.

## 2016-05-28 NOTE — Progress Notes (Signed)
Occupational Therapy Session Note  Patient Details  Name: Cheryl Gibbs MRN: [id number] Date of Birth: 08/27/1990  Today's Date: 05/28/2016 OT Individual Time: 0800-0910 OT Individual Time Calculation (min): 70 min     Short Term Goals: Week 1:  OT Short Term Goal 1 (Week 1): Pt will be able to sit at EOB for 5 minutes with mod A to maintain balance. OT Short Term Goal 2 (Week 1): Pt will tolerate self bathing her LUE. OT Short Term Goal 3 (Week 1): Pt will don a shirt with mod A. OT Short Term Goal 4 (Week 1): Pt will sit to stand with 1 person A with max A. OT Short Term Goal 5 (Week 1): Pt will use R hand for self ROM of L fingers.  Skilled Therapeutic Interventions/Progress Updates:     Upon entering the room, pt supine in bed with husband present for family education. Husband helps pt to stay motivated and he is present to part of therapy session as well. Pt with increase pain during movement of L UE this session. Bathing and dressing from supine with +2 assistance for LB self care. Pt incontinent of bowel this session and required total A for hygiene and clothing management. Pt comes into long sitting on bed with assist from therapist to don shirt. Pt unable to tolerate hand over hand tasks with L UE secondary to increased pain this session. Pt requiring increased time and cues to initiate functional tasks with encouragement. Pt supine in bed with call bell and all needed items within reach upon exiting the room.    Therapy Documentation Precautions:  Precautions Precautions: Fall Precaution Comments: helmet when OOB Restrictions Weight Bearing Restrictions: No General:   Vital Signs:   Pain: Pain Assessment Pain Assessment: 0-10 Pain Score: 5  Pain Type: Acute pain Pain Location: Arm Pain Orientation: Left Pain Descriptors / Indicators: Aching;Other (Comment) (sensitive to touch) Pain Frequency: Constant Pain Intervention(s): Medication (See eMAR) ADL: ADL ADL  Comments: refer to navigator Exercises:   Other Treatments:    See Function Navigator for Current Functional Status.   Therapy/Group: Individual Therapy  Alen BleacherBradsher, Amrit Erck P 05/28/2016, 9:24 AM

## 2016-05-28 NOTE — Progress Notes (Signed)
Physical Therapy Session Note  Patient Details  Name: Cheryl ClinesLaura Gibbs MRN: [id number] Date of Birth: 06/26/1990  Today's Date: 05/28/2016 PT Group Time:  -   Inidividual treatment time:  1030-1130   Short Term Goals: Week 1:  PT Short Term Goal 1 (Week 1): pt will perform sitting balance with mod A PT Short Term Goal 2 (Week 1): pt will assist with functional transfers 25%  Skilled Therapeutic Interventions/Progress Updates:    Pt rates pain as 4/10 in abdomen today - nursing came and adjusted Peg placement which improved c/o.  Treatment today focused on improving functional mobility.  Pt continues with difficulty with head/hips relationship with transferring with sliding board.  Requires min to mod assist x 2 for standing in hallway, with fatigue max assist for standing.  Pt returned to bed via slide board transfer with mod assist x 2.  Pt left in bed with 4 rails up and alarm on.  Nursing present to help pt utilize bedpan.  Therapy Documentation Precautions:  Precautions Precautions: Fall Precaution Comments: helmet when OOB Restrictions Weight Bearing Restrictions: No    See Function Navigator for Current Functional Status.   Therapy/Group: Individual Therapy  Malcomb Gangemi Elveria RisingM [name] 05/28/2016, 11:49 AM

## 2016-05-28 NOTE — Progress Notes (Signed)
Speech Language Pathology Daily Session Note  Patient Details  Name: Cheryl Gibbs MRN: [id number] Date of Birth: 10-Jan-1991  Today's Date: 05/28/2016 SLP Individual Time: 8889-1694 SLP Individual Time Calculation (min): 30 min   Short Term Goals: Week 1: SLP Short Term Goal 1 (Week 1): Patient will scan to left field of enviornment to locate specific objects, etc with Mod A verbal and visual cues.  SLP Short Term Goal 2 (Week 1): Patient will demonstrate sustained attention to functional tasks for 2 minutes with Mod A verbal cues.  SLP Short Term Goal 3 (Week 1): Patient will vocalize on command in 25% of opportunities with Max A multimodal cues.  SLP Short Term Goal 4 (Week 1): Patient will demonstrate reading comprehension at the phrase level with 75% accuracy with Mod A multimodal cues.  SLP Short Term Goal 5 (Week 1): Patient will consume PO trials with minimal overt s/s of aspiration and initiate a swallow response in 100% of trials with Min A verbal cues over 2 sessions to assess readiness for an objective swallow study.  SLP Short Term Goal 5 - Progress (Week 1): Met SLP Short Term Goal 6 (Week 1): Patient will consume current diet with miniaml overt s/s of aspiration and Mod A verbal cues for use of swallowing compensatory strategies   Skilled Therapeutic Interventions: Skilled treatment session focused on dysphagia and speech goals. SLP facilitated session by providing skilled observation with snack of Dys. 1 textures with nectar-thick liquids. Patient consumed snack without overt s/s of aspiration and required Mod A verbal cues to self-monitor and correct left anterior spillage and Min A to manage bite size. Patient utilized dry erase board for communication but required Mod A verbal cues and multiple attempts to self-monitor and correct errors in regards to expressing her wants/needs at the word and phrase level. Patient left upright in bed with all needs within reach. Continue with current  plan of care.    Function:  Eating Eating   Modified Consistency Diet: Yes Eating Assist Level: Supervision or verbal cues;Set up assist for   Eating Set Up Assist For: Opening containers       Cognition Comprehension Comprehension assist level: Understands basic 50 - 74% of the time/ requires cueing 25 - 49% of the time  Expression Expression assistive device: Communication board Expression assist level: Expresses basic 25 - 49% of the time/requires cueing 50 - 75% of the time. Uses single words/gestures.  Social Interaction Social Interaction assist level: Interacts appropriately 25 - 49% of time - Needs frequent redirection.  Problem Solving Problem solving assist level: Solves basic less than 25% of the time - needs direction nearly all the time or does not effectively solve problems and may need a restraint for safety  Memory Memory assist level: Recognizes or recalls 25 - 49% of the time/requires cueing 50 - 75% of the time    Pain Pain Assessment Pain Assessment: 0-10 Pain Score: 5  Pain Type: Acute pain Pain Location: Arm Pain Orientation: Left Pain Descriptors / Indicators: Aching Pain Frequency: Constant Pain Intervention(s): Medication (See eMAR)  Therapy/Group: Individual Therapy  Carmella Kees 05/28/2016, 3:53 PM

## 2016-05-28 NOTE — Progress Notes (Signed)
[facility] PHYSICAL MEDICINE & REHABILITATION     PROGRESS NOTE    Subjective/Complaints: In bed with husband. Appears to be in good spirits. Denies new pain. Had a reasonable night.  ROS: pt denies nausea, vomiting, diarrhea, cough, shortness of breath or chest pain   Objective: Vital Signs: Blood pressure 132/68, pulse 71, temperature 98.1 F (36.7 C), temperature source Oral, resp. rate 16, height 5\' 4"  (1.626 m), weight 87.8 kg (193 lb 8 oz), last menstrual period 05/01/2016, SpO2 96 %. No results found.  Recent Labs  05/27/16 0659  WBC 6.4  HGB 11.3*  HCT 35.8*  PLT 329    Recent Labs  05/27/16 0659  NA 140  K 3.8  CL 103  GLUCOSE 89  BUN 15  CREATININE 0.65  CALCIUM 9.5   CBG (last 3)   Recent Labs  05/27/16 1859 05/27/16 2338 05/28/16 0548  GLUCAP 115* 92 95    Wt Readings from Last 3 Encounters:  05/27/16 87.8 kg (193 lb 8 oz)  05/20/16 84.4 kg (186 lb 1.6 oz)    Physical Exam:  Constitutional: She appears well-developed and well-nourished.  HENT:  Mouth/Throat: Oropharynx is clear and moist.  Right crani incision stable    Eyes: Conjunctivae are normal. Pupils are equal, round, and reactive to light.    Neck: Neck supple.  Trach stoma closed without leak   Cardiovascular: RRR.   Respiratory: CTA  GI: Soft. Bowel sounds are normal. She exhibits no distension. There is no tenderness.  PEG site clean without drainage.  Musculoskeletal:  Decreased tenderness in extremities  Neurological: She is alert.   Spastic left hemiparesis with tight left heel cord noted---remains 1/4 in general. Improved dysesthesias LUE and LLE with improved activity, tolerance of PROM.  Moves RUE/RLE freely  Skin: Skin is warm and dry.  Psychiatric: flat  Assessment/Plan: 1. 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 Physiatrist is  providing close team supervision and 24 hour management of active medical problems listed below. Physiatrist and rehab team continue to assess barriers to discharge/monitor patient progress toward functional and medical goals.  Function:  Bathing Bathing position   Position: Wheelchair/chair at sink  Bathing parts Body parts bathed by patient: Left arm, Chest Body parts bathed by helper: Right arm  Bathing assist Assist Level: 2 helpers      Upper Body Dressing/Undressing Upper body dressing   What is the patient wearing?: Pull over shirt/dress     Pull over shirt/dress - Perfomed by patient: Thread/unthread right sleeve Pull over shirt/dress - Perfomed by helper: Thread/unthread left sleeve, Put head through opening, Pull shirt over trunk        Upper body assist Assist Level: Touching or steadying assistance(Pt > 75%)      Lower Body Dressing/Undressing Lower body dressing   What is the patient wearing?: Pants, Socks, Shoes, AFO       Pants- Performed by helper: Thread/unthread right pants leg, Thread/unthread left pants leg, Pull pants up/down       Socks - Performed by helper: Don/doff right sock, Don/doff left sock   Shoes - Performed by helper: Don/doff right shoe, Don/doff left shoe, Fasten right, Fasten left   AFO - Performed by helper: Don/doff left AFO      Lower body assist Assist for lower body dressing: 2 Helpers      Toileting Toileting Toileting activity did not occur:  Safety/medical concerns   Toileting steps completed by helper: Adjust clothing prior to toileting, Performs perineal hygiene, Adjust clothing after toileting    Toileting assist Assist level: Two helpers   Transfers Chair/bed transfer   Chair/bed transfer method: Lateral scoot Chair/bed transfer assist level: 2 helpers Chair/bed transfer assistive device: Sliding board Mechanical lift: Maximove   Locomotion Ambulation Ambulation activity did not occur: Safety/medical  concerns   Max distance: 3 ft  Assist level: 2 helpers   Wheelchair   Type: Manual   Assist Level: Dependent (Pt equals 0%)  Cognition Comprehension Comprehension assist level: Understands basic 50 - 74% of the time/ requires cueing 25 - 49% of the time  Expression Expression assist level: Expresses basic 25 - 49% of the time/requires cueing 50 - 75% of the time. Uses single words/gestures.  Social Interaction Social Interaction assist level: Interacts appropriately 25 - 49% of time - Needs frequent redirection.  Problem Solving Problem solving assist level: Solves basic less than 25% of the time - needs direction nearly all the time or does not effectively solve problems and may need a restraint for safety  Memory Memory assist level: Recognizes or recalls 25 - 49% of the time/requires cueing 50 - 75% of the time     Medical Problem List and Plan: 1.  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  -continue PT/OT/SLP.    -continue soft helmet when OOB 2.  DVT Prophylaxis/Anticoagulation: Mechanical: Sequential compression devices, below knee Bilateral lower extremities. LLE dopplers 12/26 negative for DVT 3. Headaches/Pain Management:   -maintain gabapentin at 300mg  TID for headaches and dysesthesias     -continue hydrocodone at 10mg   -behavioral mgt, perseverative component to pain and participation in therapy    4. Mood: LCSW to follow for evaluation and support when appropriate.  5. Neuropsych: This patient is not capable of making decisions on her own behalf. 6. Skin/Wound Care: routine pressure relief measures.  7. Fluids/Electrolytes/Nutrition: Bolus TF's. maintain NPO for now. (wants a "hot dog")  -     8. VDRF:decannulated without issue, trach stoma closed 9. New onset seizures: Continue Keppra bid.  10. Spasticity/Neuropathy LUE/LLE: see above  -continue low dose baclofen 5mg  TID---tolerating thus  far 11. E Coli UTI---sens to cipro--continue for 7 days total  .  LOS (Days) 8 A FACE TO FACE EVALUATION WAS PERFORMED  Ranelle Oyster, MD 05/28/2016 8:56 AM

## 2016-05-29 LAB — GLUCOSE, CAPILLARY
Glucose-Capillary: 103 mg/dL — ABNORMAL HIGH (ref 65–99)
Glucose-Capillary: 112 mg/dL — ABNORMAL HIGH (ref 65–99)
Glucose-Capillary: 90 mg/dL (ref 65–99)

## 2016-05-29 MED ORDER — OSMOLITE 1.5 CAL PO LIQD
237.0000 mL | Freq: Every day | ORAL | Status: DC
  Administered 2016-05-29 – 2016-05-31 (×3): 237 mL
  Filled 2016-05-29 (×6): qty 237

## 2016-05-29 NOTE — Progress Notes (Signed)
Physical Therapy Session Note  Patient Details  Name: Cheryl Gibbs MRN: [id number] Date of Birth: 09/11/1990  Today's Date: 05/28/2016 PT Individual Time:1550-1530   PT Individual Time Calculation: 40 min   Short Term Goals: Week 1:  PT Short Term Goal 1 (Week 1): pt will perform sitting balance with mod A PT Short Term Goal 2 (Week 1): pt will assist with functional transfers 25%  Skilled Therapeutic Interventions/Progress Updates:     Pt received supine in bed and agreeable to PT. Supine>sit transfer with max ssist and mac cues cues for proper use of UE and improved trunk control. Sitting balance EOB with intermittent mod-max assist x 5 minutes with cues for imrpoved anterior weight shift.  SB transfer to [facility] with max assist +2 with max education/ instruction on head hip relationship. Patient noted pain in LLE in transfer to the L which limited participation. Scooting in chair for improved pelvic positioning with mod-max assist from PT.   Sit<>stand in parallel bars x 2 with max assist and PT pt block the LLE to prevent knee buckling. Patient able to sustain standing for 15 seconds and then 10 seconds before sitting down in WC. Max cues to posture and trunk activation to maintain erect stance.   Patient returned too room and left sitting in [facility] with soft touch call bell in reach and all needs met.        Therapy Documentation Precautions:  Precautions Precautions: Fall Precaution Comments: helmet when OOB Restrictions Weight Bearing Restrictions: No Vital Signs: Therapy Vitals Temp: 98.4 F (36.9 C) Temp Source: Oral Pulse Rate: 65 Resp: 16 BP: (!) 78/51 Patient Position (if appropriate): Lying Oxygen Therapy SpO2: 97 % O2 Device: Not Delivered Pain: Pain Scale: Faces: (mild Pain)  See Function Navigator for Current Functional Status.   Therapy/Group: Individual Therapy  Lorie Phenix 05/29/2016, 6:04 AM

## 2016-05-29 NOTE — Progress Notes (Signed)
Nutrition Follow-up  DOCUMENTATION CODES:   Obesity unspecified  INTERVENTION:  Pending if pt to return, Let pt attempt to eat at meals, if po intake is <50% at that meal provide a bolus feed via PEG at volume of 300 ml given up to TID after meals.   Additionally provide HS bolus feed of Osmolite 1.5 at volume of 237 ml.  Continue 30 ml Prostat per tube.   Continue free water flushes.  NUTRITION DIAGNOSIS:   Inadequate oral intake related to inability to eat as evidenced by NPO status; diet advanced; po 5%, onoging  GOAL:   Patient will meet greater than or equal to 90% of their needs; not met  MONITOR:   TF tolerance, Labs, Weight trends, Skin, I & O's  REASON FOR ASSESSMENT:   Consult Enteral/tube feeding initiation and management  ASSESSMENT:   26 year old female who was 10 days post partum and  was transferred to [facility] from OSH on 04/11/16  after being found unresponsive by husband.  Patient in bathtub--not submerged and activity seizing. 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  She underwent cerebral angiogram and decline neurologically shortly after admission to ICU. She was taken to OR emergently for right craniotomy for evacuation of IPH and obliteration of ruptured lenticulostriate aneurysm and placement of EVD. Trach and PEG were placed on 11/22.  She was admitted to [facility]) on 05/01/16. She is non verbal.  Pt is currently at an appointment at [facility] per RN. Unknown if pt to return to rehab. RD to follow up with patient in case pt to return today. Noted pt has been advanced to a dysphagia 1 diet with nectar thick liquids. Meal completion has been 5%. Let pt attempt to eat at meals, if po is <50% at that meal, provide a bolus feed. Noted Osmolite 1.2 has been  ordered for HS bolus. RD to modify orders to Osmolite 1.5 instead to make sure pt is on the same formula.   Diet Order:  DIET - DYS 1 Room service appropriate? Yes; Fluid consistency: Nectar Thick  Skin:   (Incisionon head R)  Last BM:  1/4  Height:   Ht Readings from Last 1 Encounters:  05/20/16 '5\' 4"'  (1.626 m)    Weight:   Wt Readings from Last 1 Encounters:  05/27/16 193 lb 8 oz (87.8 kg)    Ideal Body Weight:  54.5 kg  BMI:  Body mass index is 33.21 kg/m.  Estimated Nutritional Needs:   Kcal:  1850-2050  Protein:  90-105 grams  Fluid:  1.8 - 2 L/day  EDUCATION NEEDS:   No education needs identified at this time  Corrin Parker, MS, RD, LDN Pager # [phone] After hours/ weekend pager # [phone]

## 2016-05-29 NOTE — Progress Notes (Addendum)
Patient has appointment this am 10:30 for CT brain before follow up with NS for evaluation. [facility] check in followed by appointment with NS at 1:30--( Ladora DanielJennifer Aldridge, NP/Kyle Lionel DecemberFargen MD on 4th floor [facility] Office made aware of patient's current need for assistance/non-ambulatory status and will try to work her in before that time as able.

## 2016-05-29 NOTE — Progress Notes (Signed)
Speech Language Pathology Weekly Progress Note  Patient Details  Name: Rayvon Guevarra MRN: [id number] Date of Birth: 08/08/1990  Beginning of progress report period: May 21, 2016 End of progress report period: May 29, 2016   Short Term Goals: Week 1: SLP Short Term Goal 1 (Week 1): Patient will scan to left field of enviornment to locate specific objects, etc with Mod A verbal and visual cues.  SLP Short Term Goal 1 - Progress (Week 1): Met SLP Short Term Goal 2 (Week 1): Patient will demonstrate sustained attention to functional tasks for 2 minutes with Mod A verbal cues.  SLP Short Term Goal 2 - Progress (Week 1): Met SLP Short Term Goal 3 (Week 1): Patient will vocalize on command in 25% of opportunities with Max A multimodal cues.  SLP Short Term Goal 3 - Progress (Week 1): Not met SLP Short Term Goal 4 (Week 1): Patient will demonstrate reading comprehension at the phrase level with 75% accuracy with Mod A multimodal cues.  SLP Short Term Goal 4 - Progress (Week 1): Discontinued (comment) SLP Short Term Goal 5 (Week 1): Patient will consume PO trials with minimal overt s/s of aspiration and initiate a swallow response in 100% of trials with Min A verbal cues over 2 sessions to assess readiness for an objective swallow study.  SLP Short Term Goal 5 - Progress (Week 1): Met SLP Short Term Goal 6 (Week 1): Patient will consume current diet with miniaml overt s/s of aspiration and Mod A verbal cues for use of swallowing compensatory strategies     New Short Term Goals: Week 2: SLP Short Term Goal 1 (Week 2): Patient will scan to left field of enviornment to locate specific objects, etc with Mod A verbal and visual cues.  SLP Short Term Goal 2 (Week 2): Patient will demonstrate sustained attention to functional tasks for 5 minutes with Mod A verbal cues.  SLP Short Term Goal 3 (Week 2): Patient will vocalize on command in 25% of opportunities with Max A multimodal cues.  SLP Short Term  Goal 4 (Week 2): Patient will demonstrate basic problem solving with Mod A verbal cues.  SLP Short Term Goal 5 (Week 2): Patient will consume current diet with miniaml overt s/s of aspiration and Mod A verbal cues for use of swallowing compensatory strategies  SLP Short Term Goal 6 (Week 2): Patient will self-monitor and correct erros during written expression with Min A multimodla cues.   Weekly Progress Updates: Patient has made functional gains and has met 3 of 4 STG's this reporting period. Patient had MBS on 05/28/16 and initiated a diet of Dys. 1 textures with nectar-thick liquids due to silent aspiration of thin liquids and moderate oral-motor weakness leading to difficulty with mastication. Patient is nonverbal with inability to vocalize on command despite Max A multimodal cues, suspect due to apraxia. Patient utilizes written expression to express basic wants/needs. Patient requires extra time, multiple attempts and overall Mod A multimodal cues to self-monitor and correct errors with written expression, suspect due to a mild language impairment. Patient demonstrates increased attention to her left field of environment and sustained attention to tasks of interest but continues to be limited by her decreased frustration tolerance which can then impact her overall participation. Patient and family education is complete. Patient would benefit from continued skilled SLP intervention to maximize her cognitive and swallowing function as well as her functional communication in order to maximize her overall functional independence prior to discharge.       Intensity: Minumum of 1-2 x/day, 30 to 90 minutes Frequency: 3 to 5 out of 7 days Duration/Length of Stay: 4-6 weeks Treatment/Interventions: Functional tasks;Patient/family education;Cueing hierarchy;Dysphagia/aspiration precaution training;Therapeutic Activities;Cognitive remediation/compensation;Internal/external aids;Speech/Language  facilitation   ,  05/29/2016, 3:21 PM         

## 2016-05-29 NOTE — Progress Notes (Signed)
[place] PHYSICAL MEDICINE & REHABILITATION     PROGRESS NOTE    Subjective/Complaints: No new problems overnight.   ROS limited due to language  Objective: Vital Signs: Blood pressure (!) 102/53, pulse 62, temperature 98.3 F (36.8 C), temperature source Oral, resp. rate 18, height 5\' 4"  (1.626 m), weight 87.8 kg (193 lb 8 oz), last menstrual period 05/01/2016, SpO2 98 %. Dg Swallowing Func-speech Pathology  Result Date: 05/28/2016 Objective Swallowing Evaluation: Type of Study: MBS-Modified Barium Swallow Study Patient Details Name: Cheryl Gibbs MRN: [id number] Date of Birth: 07/28/1990 Today's Date: 05/28/2016 Time: 1000-1025 Individual Treatment Time: 25 mins No Data Recorded Past Medical History: Past Medical History: Diagnosis Date . ADD (attention deficit disorder)  . Occipital neuralgia  Past Surgical History: Past Surgical History: Procedure Laterality Date . CESAREAN SECTION  03/31/2016 HPI: see H&P No Data Recorded Assessment / Plan / Recommendation CHL IP CLINICAL IMPRESSIONS 05/28/2016 Therapy Diagnosis Moderate oral phase dysphagia;Mild pharyngeal phase dysphagia Clinical Impression Patient demonstrates a moderate oral  and mild pharyngeal sensory based dysphagia. Oral phase is characterized by impaired mastication of solid textures leading to oral expectoration, right anterior spillage of solids and liquids and prolonged AP transit with all trials. Suspect patient's oral phase in regards to mastication of solids was impacted by motivation due to disliking taste of barium. Pharyngeal phase is characterized by delayed swallow initiation and impaired timing/coordination of swallow resulting in silent flash penetration of nectar-thick liquids and silent aspiration of thin liquids. Compensatory strategies were not attempted due to motor planning impairments. Recommend patient initiate a diet of Dys. 1 textures with nectar-thick liquids with full supervision to maximize safety. Discussed results  with husband who verbalized understanding.  Impact on safety and function Moderate aspiration risk   CHL IP TREATMENT RECOMMENDATION 05/28/2016 Treatment Recommendations Therapy as outlined in treatment plan below   Prognosis 05/28/2016 Prognosis for Safe Diet Advancement Good Barriers to Reach Goals Cognitive deficits Barriers/Prognosis Comment -- CHL IP DIET RECOMMENDATION 05/28/2016 SLP Diet Recommendations Dysphagia 1 (Puree) solids;Nectar thick liquid Liquid Administration via Cup;No straw Medication Administration Crushed with puree Compensations Minimize environmental distractions;Slow rate;Small sips/bites;Monitor for anterior loss Postural Changes Remain semi-upright after after feeds/meals (Comment)   CHL IP OTHER RECOMMENDATIONS 05/28/2016 Recommended Consults -- Oral Care Recommendations Oral care BID Other Recommendations Order thickener from pharmacy;Prohibited food (jello, ice cream, thin soups);Remove water pitcher;Have oral suction available   CHL IP FOLLOW UP RECOMMENDATIONS 05/28/2016 Follow up Recommendations Home health SLP;24 hour supervision/assistance   CHL IP FREQUENCY AND DURATION 05/28/2016 Speech Therapy Frequency (ACUTE ONLY) min 5x/week Treatment Duration 4 weeks      CHL IP ORAL PHASE 05/28/2016 Oral Phase Impaired Oral - Pudding Teaspoon -- Oral - Pudding Cup -- Oral - Honey Teaspoon -- Oral - Honey Cup -- Oral - Nectar Teaspoon -- Oral - Nectar Cup Left anterior bolus loss;Delayed oral transit Oral - Nectar Straw -- Oral - Thin Teaspoon -- Oral - Thin Cup Right anterior bolus loss;Delayed oral transit Oral - Thin Straw Right anterior bolus loss Oral - Puree Right anterior bolus loss;Delayed oral transit Oral - Mech Soft Impaired mastication;Delayed oral transit Oral - Regular Impaired mastication;Other (Comment) Oral - Multi-Consistency -- Oral - Pill -- Oral Phase - Comment --  CHL IP PHARYNGEAL PHASE 05/28/2016 Pharyngeal Phase Impaired Pharyngeal- Pudding Teaspoon -- Pharyngeal -- Pharyngeal-  Pudding Cup -- Pharyngeal -- Pharyngeal- Honey Teaspoon -- Pharyngeal -- Pharyngeal- Honey Cup -- Pharyngeal -- Pharyngeal- Nectar Teaspoon -- Pharyngeal -- Pharyngeal- Nectar Cup Delayed  swallow initiation-vallecula;Penetration/Aspiration during swallow Pharyngeal Material enters airway, remains ABOVE vocal cords then ejected out Pharyngeal- Nectar Straw -- Pharyngeal -- Pharyngeal- Thin Teaspoon -- Pharyngeal -- Pharyngeal- Thin Cup Delayed swallow initiation-pyriform sinuses;Penetration/Aspiration during swallow;Penetration/Apiration after swallow Pharyngeal Material enters airway, remains ABOVE vocal cords then ejected out;Material enters airway, passes BELOW cords without attempt by patient to eject out (silent aspiration) Pharyngeal- Thin Straw Delayed swallow initiation-pyriform sinuses Pharyngeal -- Pharyngeal- Puree Delayed swallow initiation-vallecula Pharyngeal -- Pharyngeal- Mechanical Soft Delayed swallow initiation-vallecula Pharyngeal -- Pharyngeal- Regular (No Data) Pharyngeal -- Pharyngeal- Multi-consistency -- Pharyngeal -- Pharyngeal- Pill -- Pharyngeal -- Pharyngeal Comment --  CHL IP CERVICAL ESOPHAGEAL PHASE 05/28/2016 Cervical Esophageal Phase WFL Pudding Teaspoon -- Pudding Cup -- Honey Teaspoon -- Honey Cup -- Nectar Teaspoon -- Nectar Cup -- Nectar Straw -- Thin Teaspoon -- Thin Cup -- Thin Straw -- Puree -- Mechanical Soft -- Regular -- Multi-consistency -- Pill -- Cervical Esophageal Comment -- No flowsheet data found. PAYNE, COURTNEY 05/28/2016, 3:34 PM               Recent Labs  05/27/16 0659  WBC 6.4  HGB 11.3*  HCT 35.8*  PLT 329    Recent Labs  05/27/16 0659  NA 140  K 3.8  CL 103  GLUCOSE 89  BUN 15  CREATININE 0.65  CALCIUM 9.5   CBG (last 3)   Recent Labs  05/28/16 1722 05/28/16 2321 05/29/16 0557  GLUCAP 82 113* 90    Wt Readings from Last 3 Encounters:  05/27/16 87.8 kg (193 lb 8 oz)  05/20/16 84.4 kg (186 lb 1.6 oz)    Physical Exam:   Constitutional: She appears well-developed and well-nourished.  HENT:  Mouth/Throat: Oropharynx is clear and moist.  Right crani incision stable-- no change Eyes: Conjunctivae are normal. Pupils are equal, round, and reactive to light.    Neck: Neck supple.  Trach stoma closed without leak   Cardiovascular: RRR.   Respiratory: CTA  GI: Soft. Bowel sounds are normal. She exhibits no distension. There is no tenderness.  PEG site clean without drainage.  Musculoskeletal:  Decreased tenderness in extremities  Neurological: She is alert.   Spastic left hemiparesis with tight left heel cord noted---remains 1/4 in general. Improved dysesthesias LUE and LLE with improved activity, tolerance of PROM.  Moves RUE/RLE freely  Skin: Skin is warm and dry.  Psychiatric: flat  Assessment/Plan: 1. 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 Physiatrist is providing close team supervision and 24 hour management of active medical problems listed below. Physiatrist and rehab team continue to assess barriers to discharge/monitor patient progress toward functional and medical goals.  Function:  Bathing Bathing position   Position: Bed  Bathing parts Body parts bathed by patient: Left arm, Chest, Abdomen, Front perineal area Body parts bathed by helper: Right arm, Buttocks, Right upper leg, Left upper leg, Right lower leg, Left lower leg, Back  Bathing assist Assist Level: 2 helpers      Upper Body Dressing/Undressing Upper body dressing   What is the patient wearing?: Button up shirt     Pull over shirt/dress - Perfomed by patient: Thread/unthread right sleeve Pull over shirt/dress - Perfomed by helper: Thread/unthread left sleeve, Put head through opening, Pull shirt over trunk Button up shirt - Perfomed by patient: Thread/unthread right sleeve Button up shirt -  Perfomed by helper: Thread/unthread left sleeve, Pull shirt  around back, Button/unbutton shirt    Upper body assist Assist Level:  (max a)      Lower Body Dressing/Undressing Lower body dressing   What is the patient wearing?: Pants     Pants- Performed by patient: Pull pants up/down Pants- Performed by helper: Thread/unthread right pants leg, Thread/unthread left pants leg, Pull pants up/down       Socks - Performed by helper: Don/doff right sock, Don/doff left sock   Shoes - Performed by helper: Don/doff right shoe, Don/doff left shoe, Fasten right, Fasten left   AFO - Performed by helper: Don/doff left AFO      Lower body assist Assist for lower body dressing: 2 Designer, multimedia activity did not occur: Safety/medical concerns   Toileting steps completed by helper: Adjust clothing prior to toileting, Performs perineal hygiene, Adjust clothing after toileting    Toileting assist Assist level: Two helpers   Transfers Chair/bed transfer   Chair/bed transfer method: Lateral scoot Chair/bed transfer assist level: 2 helpers Chair/bed transfer assistive device:  (slideboard) Mechanical lift: Maximove   Locomotion Ambulation Ambulation activity did not occur: Safety/medical concerns   Max distance: 3 ft  Assist level: 2 helpers   Wheelchair   Type: Manual   Assist Level: Dependent (Pt equals 0%)  Cognition Comprehension Comprehension assist level: Understands basic 50 - 74% of the time/ requires cueing 25 - 49% of the time  Expression Expression assist level: Expresses basic 25 - 49% of the time/requires cueing 50 - 75% of the time. Uses single words/gestures.  Social Interaction Social Interaction assist level: Interacts appropriately 25 - 49% of time - Needs frequent redirection.  Problem Solving Problem solving assist level: Solves basic less than 25% of the time - needs direction nearly all the time or does not effectively solve problems  and may need a restraint for safety  Memory Memory assist level: Recognizes or recalls 25 - 49% of the time/requires cueing 50 - 75% of the time     Medical Problem List and Plan: 1.  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  -NS reviewed 12/22 CT and had concerns over the fluid collection/hydrocephalus. Is seeing patient this morning to assess need for shunt. Apparently they are aware of pt's stable neuro status as well.    -continue soft helmet when OOB 2.  DVT Prophylaxis/Anticoagulation: Mechanical: Sequential compression devices, below knee Bilateral lower extremities. LLE dopplers 12/26 negative for DVT 3. Headaches/Pain Management:   -maintain gabapentin at 300mg  TID for headaches and dysesthesias     -continue hydrocodone at 10mg   -behavioral mgt, perseverative component to pain and participation in therapy    4. Mood: LCSW to follow for evaluation and support when appropriate.  5. Neuropsych: This patient is not capable of making decisions on her own behalf. 6. Skin/Wound Care: routine pressure relief measures.  7. Fluids/Electrolytes/Nutrition: Bolus TF's. maintain NPO for now.  -     8. VDRF:decannulated without issue, trach stoma closed 9. New onset seizures: Continue Keppra bid.  10. Spasticity/Neuropathy LUE/LLE: see above  -continue low dose baclofen 5mg  TID---tolerating thus far 11. E Coli UTI---sens to cipro--has completed 7 day course  .  LOS (Days) 9 A FACE TO FACE EVALUATION WAS PERFORMED  Faith Rogue T, MD 05/29/2016 10:14 AM

## 2016-05-30 ENCOUNTER — Inpatient Hospital Stay (HOSPITAL_COMMUNITY): Payer: Managed Care, Other (non HMO) | Admitting: Speech Pathology

## 2016-05-30 ENCOUNTER — Inpatient Hospital Stay (HOSPITAL_COMMUNITY): Payer: Managed Care, Other (non HMO) | Admitting: Physical Therapy

## 2016-05-30 ENCOUNTER — Inpatient Hospital Stay (HOSPITAL_COMMUNITY): Payer: Managed Care, Other (non HMO) | Admitting: *Deleted

## 2016-05-30 ENCOUNTER — Inpatient Hospital Stay (HOSPITAL_COMMUNITY): Payer: Managed Care, Other (non HMO) | Admitting: Occupational Therapy

## 2016-05-30 LAB — GLUCOSE, CAPILLARY
GLUCOSE-CAPILLARY: 95 mg/dL (ref 65–99)
GLUCOSE-CAPILLARY: 98 mg/dL (ref 65–99)
Glucose-Capillary: 136 mg/dL — ABNORMAL HIGH (ref 65–99)

## 2016-05-30 NOTE — Progress Notes (Signed)
Occupational Therapy Session Note  Patient Details  Name: Cheryl ClinesLaura Gibbs MRN: [id number] Date of Birth: 04/14/1991  Today's Date: 05/30/2016 OT Individual Time: [id number] OT Individual Time Calculation (min): 60 min   Skilled Therapeutic Interventions/Progress Updates:   Patient able to complete UB bathing and dressing with Max to Total A x2 to don shirt and pull down and Total A x 2 for lateral rolls to her right for brief change after pericleansing.  Patient was able to use communication board to express needs, questions and answers today.   Once she actually was able to express an audible, "No."    She remained in bed with nursing tech helping with self feeding at the end of the session erapy Documentation Precautions:  Precautions Precautions: Fall Precaution Comments: helmet when OOB Restrictions Weight Bearing Restrictions: No   Pain: Pain Assessment Pain Assessment: 0-10 Pain Score: 4  Pain Type: Acute pain Pain Location: Head Pain Orientation: Right Pain Descriptors / Indicators: Aching Pain Frequency: Constant Pain Onset: On-going Pain Intervention(s): Medication (See eMAR)  See Function Navigator for Current Functional Status.   Therapy/Group: Individual Therapy  Bud Faceickett, Mosetta Ferdinand [facility] 05/30/2016, 12:36 PM

## 2016-05-30 NOTE — Progress Notes (Signed)
Physical Therapy Session Note  Patient Details  Name: Cheryl Gibbs MRN: [id number] Date of Birth: Feb 02, 1991  Today's Date: 05/30/2016 PT Individual Time: 1420-1500 PT Individual Time Calculation (min): 40 min    Short Term Goals: Week 1:  PT Short Term Goal 1 (Week 1): pt will perform sitting balance with mod A PT Short Term Goal 2 (Week 1): pt will assist with functional transfers 25%  Skilled Therapeutic Interventions/Progress Updates:     Husband present for encouragement and motivation throughout treatment.    Bed mobility. With max assist for rolling L and R and max cues for l  Total +2 maxi move transfer to [facility]  Standing tolerance. At rail in hall x 3 for 15 seconds each. PT wrapped LLE with ace wrap to stabilize ankle as well as Air cast  Gait at rail in hall with +2 max assist x 5 steps PT required to assist with LLE and limb advancement and knee/hip stabilization.   Patient returned too room and left sitting in [facility] with call bell in reach and all needs met.      Therapy Documentation Precautions:  Precautions Precautions: Fall Precaution Comments: helmet when OOB Restrictions Weight Bearing Restrictions: No    Pain: Pain Assessment Pain Assessment: 0-10 Pain Score: 4  Pain Type: Acute pain Pain Location: Arm Pain Orientation: Left Pain Descriptors / Indicators: Aching;Tender Pain Frequency: Constant Pain Onset: On-going Pain Intervention(s): Medication (See eMAR)   See Function Navigator for Current Functional Status.   Therapy/Group: Individual Therapy  Lorie Phenix 05/30/2016, 5:31 PM

## 2016-05-30 NOTE — Progress Notes (Signed)
[place] PHYSICAL MEDICINE & REHABILITATION     PROGRESS NOTE    Subjective/Complaints: No issues overnite, pt awake and alert waves hellow/goodbye Per boyfriend moving Left foot a little  ROS limited due to language  Objective: Vital Signs: Blood pressure 118/74, pulse 72, temperature 97.8 F (36.6 C), temperature source Oral, resp. rate 18, height 5\' 4"  (1.626 m), weight 87.8 kg (193 lb 8 oz), last menstrual period 05/01/2016, SpO2 97 %. Dg Swallowing Func-speech Pathology  Result Date: 05/28/2016 Objective Swallowing Evaluation: Type of Study: MBS-Modified Barium Swallow Study Patient Details Name: Cheryl Gibbs MRN: [id number] Date of Birth: 09/26/1990 Today's Date: 05/28/2016 Time: 1000-1025 Individual Treatment Time: 25 mins No Data Recorded Past Medical History: Past Medical History: Diagnosis Date . ADD (attention deficit disorder)  . Occipital neuralgia  Past Surgical History: Past Surgical History: Procedure Laterality Date . CESAREAN SECTION  03/31/2016 HPI: see H&P No Data Recorded Assessment / Plan / Recommendation CHL IP CLINICAL IMPRESSIONS 05/28/2016 Therapy Diagnosis Moderate oral phase dysphagia;Mild pharyngeal phase dysphagia Clinical Impression Patient demonstrates a moderate oral  and mild pharyngeal sensory based dysphagia. Oral phase is characterized by impaired mastication of solid textures leading to oral expectoration, right anterior spillage of solids and liquids and prolonged AP transit with all trials. Suspect patient's oral phase in regards to mastication of solids was impacted by motivation due to disliking taste of barium. Pharyngeal phase is characterized by delayed swallow initiation and impaired timing/coordination of swallow resulting in silent flash penetration of nectar-thick liquids and silent aspiration of thin liquids. Compensatory strategies were not attempted due to motor planning impairments. Recommend patient initiate a diet of Dys. 1 textures with nectar-thick  liquids with full supervision to maximize safety. Discussed results with husband who verbalized understanding.  Impact on safety and function Moderate aspiration risk   CHL IP TREATMENT RECOMMENDATION 05/28/2016 Treatment Recommendations Therapy as outlined in treatment plan below   Prognosis 05/28/2016 Prognosis for Safe Diet Advancement Good Barriers to Reach Goals Cognitive deficits Barriers/Prognosis Comment -- CHL IP DIET RECOMMENDATION 05/28/2016 SLP Diet Recommendations Dysphagia 1 (Puree) solids;Nectar thick liquid Liquid Administration via Cup;No straw Medication Administration Crushed with puree Compensations Minimize environmental distractions;Slow rate;Small sips/bites;Monitor for anterior loss Postural Changes Remain semi-upright after after feeds/meals (Comment)   CHL IP OTHER RECOMMENDATIONS 05/28/2016 Recommended Consults -- Oral Care Recommendations Oral care BID Other Recommendations Order thickener from pharmacy;Prohibited food (jello, ice cream, thin soups);Remove water pitcher;Have oral suction available   CHL IP FOLLOW UP RECOMMENDATIONS 05/28/2016 Follow up Recommendations Home health SLP;24 hour supervision/assistance   CHL IP FREQUENCY AND DURATION 05/28/2016 Speech Therapy Frequency (ACUTE ONLY) min 5x/week Treatment Duration 4 weeks      CHL IP ORAL PHASE 05/28/2016 Oral Phase Impaired Oral - Pudding Teaspoon -- Oral - Pudding Cup -- Oral - Honey Teaspoon -- Oral - Honey Cup -- Oral - Nectar Teaspoon -- Oral - Nectar Cup Left anterior bolus loss;Delayed oral transit Oral - Nectar Straw -- Oral - Thin Teaspoon -- Oral - Thin Cup Right anterior bolus loss;Delayed oral transit Oral - Thin Straw Right anterior bolus loss Oral - Puree Right anterior bolus loss;Delayed oral transit Oral - Mech Soft Impaired mastication;Delayed oral transit Oral - Regular Impaired mastication;Other (Comment) Oral - Multi-Consistency -- Oral - Pill -- Oral Phase - Comment --  CHL IP PHARYNGEAL PHASE 05/28/2016 Pharyngeal Phase  Impaired Pharyngeal- Pudding Teaspoon -- Pharyngeal -- Pharyngeal- Pudding Cup -- Pharyngeal -- Pharyngeal- Honey Teaspoon -- Pharyngeal -- Pharyngeal- Honey Cup -- Pharyngeal --  Pharyngeal- Nectar Teaspoon -- Pharyngeal -- Pharyngeal- Nectar Cup Delayed swallow initiation-vallecula;Penetration/Aspiration during swallow Pharyngeal Material enters airway, remains ABOVE vocal cords then ejected out Pharyngeal- Nectar Straw -- Pharyngeal -- Pharyngeal- Thin Teaspoon -- Pharyngeal -- Pharyngeal- Thin Cup Delayed swallow initiation-pyriform sinuses;Penetration/Aspiration during swallow;Penetration/Apiration after swallow Pharyngeal Material enters airway, remains ABOVE vocal cords then ejected out;Material enters airway, passes BELOW cords without attempt by patient to eject out (silent aspiration) Pharyngeal- Thin Straw Delayed swallow initiation-pyriform sinuses Pharyngeal -- Pharyngeal- Puree Delayed swallow initiation-vallecula Pharyngeal -- Pharyngeal- Mechanical Soft Delayed swallow initiation-vallecula Pharyngeal -- Pharyngeal- Regular (No Data) Pharyngeal -- Pharyngeal- Multi-consistency -- Pharyngeal -- Pharyngeal- Pill -- Pharyngeal -- Pharyngeal Comment --  CHL IP CERVICAL ESOPHAGEAL PHASE 05/28/2016 Cervical Esophageal Phase WFL Pudding Teaspoon -- Pudding Cup -- Honey Teaspoon -- Honey Cup -- Nectar Teaspoon -- Nectar Cup -- Nectar Straw -- Thin Teaspoon -- Thin Cup -- Thin Straw -- Puree -- Mechanical Soft -- Regular -- Multi-consistency -- Pill -- Cervical Esophageal Comment -- No flowsheet data found. PAYNE, COURTNEY 05/28/2016, 3:34 PM              No results for input(s): WBC, HGB, HCT, PLT in the last 72 hours. No results for input(s): NA, K, CL, GLUCOSE, BUN, CREATININE, CALCIUM in the last 72 hours.  Invalid input(s): CO CBG (last 3)   Recent Labs  05/29/16 1826 05/29/16 2337 05/30/16 0547  GLUCAP 103* 112* 95    Wt Readings from Last 3 Encounters:  05/27/16 87.8 kg (193 lb 8 oz)   05/20/16 84.4 kg (186 lb 1.6 oz)    Physical Exam:  Constitutional: She appears well-developed and well-nourished.  HENT:  Mouth/Throat: Oropharynx is clear and moist.  Right crani incision stable-- no change, flat anteriorly , some subcut fluid posteriorly no drainage Eyes: Conjunctivae are normal. Pupils are equal, round, and reactive to light.    Neck: Neck supple.  Trach stoma closed without leak   Cardiovascular: RRR.   Respiratory: CTA  GI: Soft. Bowel sounds are normal. She exhibits no distension. There is no tenderness.  PEG small amt dried serous  drainage.  Musculoskeletal:  Decreased tenderness in extremities  Neurological: She is alert.   Spastic left hemiparesis with tight left heel cord noted---remains 1/4 in general. Improved dysesthesias LUE and LLE with improved activity, tolerance of PROM.  Voluntary Left hallux flex/extMoves RUE/RLE freely  Skin: Skin is warm and dry.  Psychiatric: flat  Assessment/Plan: 1. 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 Physiatrist is providing close team supervision and 24 hour management of active medical problems listed below. Physiatrist and rehab team continue to assess barriers to discharge/monitor patient progress toward functional and medical goals.  Function:  Bathing Bathing position   Position: Bed  Bathing parts Body parts bathed by patient: Left arm, Chest, Abdomen, Front perineal area Body parts bathed by helper: Right arm, Buttocks, Right upper leg, Left upper leg, Right lower leg, Left lower leg, Back  Bathing assist Assist Level: 2 helpers      Upper Body Dressing/Undressing Upper body dressing   What is the patient wearing?: Button up shirt     Pull over shirt/dress - Perfomed by patient: Thread/unthread right sleeve Pull over shirt/dress - Perfomed by helper: Thread/unthread  left sleeve, Put head through opening, Pull shirt over trunk Button up shirt - Perfomed by patient: Thread/unthread right sleeve Button up shirt -  Perfomed by helper: Thread/unthread left sleeve, Pull shirt around back, Button/unbutton shirt    Upper body assist Assist Level:  (max a)      Lower Body Dressing/Undressing Lower body dressing   What is the patient wearing?: Pants     Pants- Performed by patient: Pull pants up/down Pants- Performed by helper: Thread/unthread right pants leg, Thread/unthread left pants leg, Pull pants up/down       Socks - Performed by helper: Don/doff right sock, Don/doff left sock   Shoes - Performed by helper: Don/doff right shoe, Don/doff left shoe, Fasten right, Fasten left   AFO - Performed by helper: Don/doff left AFO      Lower body assist Assist for lower body dressing: 2 Designer, multimedia activity did not occur: Safety/medical concerns   Toileting steps completed by helper: Adjust clothing prior to toileting, Performs perineal hygiene, Adjust clothing after toileting    Toileting assist Assist level: Two helpers   Transfers Chair/bed transfer   Chair/bed transfer method: Lateral scoot Chair/bed transfer assist level: 2 helpers Chair/bed transfer assistive device:  (slideboard) Mechanical lift: Maximove   Locomotion Ambulation Ambulation activity did not occur: Safety/medical concerns   Max distance: 3 ft  Assist level: 2 helpers   Wheelchair   Type: Manual   Assist Level: Dependent (Pt equals 0%)  Cognition Comprehension Comprehension assist level: Understands basic 75 - 89% of the time/ requires cueing 10 - 24% of the time  Expression Expression assist level: Expresses basic 75 - 89% of the time/requires cueing 10 - 24% of the time. Needs helper to occlude trach/needs to repeat words.  Social Interaction Social Interaction assist level: Interacts appropriately 25 - 49% of time - Needs frequent  redirection.  Problem Solving Problem solving assist level: Solves basic 25 - 49% of the time - needs direction more than half the time to initiate, plan or complete simple activities  Memory Memory assist level: Recognizes or recalls 25 - 49% of the time/requires cueing 50 - 75% of the time     Medical Problem List and Plan: 1.  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  -cont CIR PT/OT/SLP  -continue soft helmet when OOB 2.  DVT Prophylaxis/Anticoagulation: Mechanical: Sequential compression devices, below knee Bilateral lower extremities. LLE dopplers 12/26 negative for DVT 3. Headaches/Pain Management: appears controlled  -maintain gabapentin at 300mg  TID for headaches and dysesthesias     -continue hydrocodone at 10mg   -behavioral mgt, perseverative component to pain and participation in therapy    4. Mood: LCSW to follow for evaluation and support when appropriate.  5. Neuropsych: This patient is not capable of making decisions on her own behalf. 6. Skin/Wound Care: routine pressure relief measures.  7. Fluids/Electrolytes/Nutrition: Bolus TF's. maintain NPO for now.  -     8. VDRF:decannulated without issue, trach stoma closed 9. New onset seizures: Continue Keppra bid.  10. Spasticity/Neuropathy LUE/LLE: see above  -continue low dose baclofen 5mg  TID---tolerating thus far 11. E Coli UTI---resolved  .  LOS (Days) 10 A FACE TO FACE EVALUATION WAS PERFORMED  Erick Colace, MD 05/30/2016 8:54 AM

## 2016-05-30 NOTE — Progress Notes (Signed)
Speech Language Pathology Daily Session Note  Patient Details  Name: Cheryl ClinesLaura Gibbs MRN: [id number] Date of Birth: 12/08/1990  Today's Date: 05/30/2016 SLP Individual Time: 1030-1100 SLP Individual Time Calculation (min): 30 min   Short Term Goals: Week 2: SLP Short Term Goal 1 (Week 2): Patient will scan to left field of enviornment to locate specific objects, etc with Mod A verbal and visual cues.  SLP Short Term Goal 2 (Week 2): Patient will demonstrate sustained attention to functional tasks for 5 minutes with Mod A verbal cues.  SLP Short Term Goal 3 (Week 2): Patient will vocalize on command in 25% of opportunities with Max A multimodal cues.  SLP Short Term Goal 4 (Week 2): Patient will demonstrate basic problem solving with Mod A verbal cues.  SLP Short Term Goal 5 (Week 2): Patient will consume current diet with miniaml overt s/s of aspiration and Mod A verbal cues for use of swallowing compensatory strategies  SLP Short Term Goal 6 (Week 2): Patient will self-monitor and correct erros during written expression with Min A multimodla cues.   Skilled Therapeutic Interventions:  Pt was seen for skilled ST targeting goals for dysphagia and communication.  Pt needed min assist verbal cues to recognize and correct errors during written expression.  Phrases were fragmented and at times illegible out of context.  Pt primarily had questions regarding her current diet.  SLP reviewed results of MBS and rationale for dys 1, nectar thick liquids due to aspiration of thins.  Pt asked therapist via whiteboard if her husband could bring her macaroni and cheese as well as a biscuit and gravy.  SLP discussed with pt that all foods had to be a pureed consistency.  Discussed with RN after session as well to check foods brought from home.  Pt consumed limited presentations of her current diet with min verbal cues for use of swallowing precautions.  No overt s/s of aspiration evident with purees or thickened liquids.   Pt was left in bed with bed alarm set and call bell within reach.  Continue per current plan of care.    Function:  Eating Eating   Modified Consistency Diet: Yes Eating Assist Level: Set up assist for;Supervision or verbal cues   Eating Set Up Assist For: Opening containers       Cognition Comprehension Comprehension assist level: Understands basic 75 - 89% of the time/ requires cueing 10 - 24% of the time  Expression Expression assistive device: Communication board Expression assist level: Expresses basis less than 25% of the time/requires cueing >75% of the time.  Social Interaction Social Interaction assist level: Interacts appropriately 50 - 74% of the time - May be physically or verbally inappropriate.  Problem Solving Problem solving assist level: Solves basic 25 - 49% of the time - needs direction more than half the time to initiate, plan or complete simple activities  Memory Memory assist level: Recognizes or recalls 25 - 49% of the time/requires cueing 50 - 75% of the time    Pain Pain Assessment Pain Assessment: No/denies pain   Therapy/Group: Individual Therapy  Cheryl Gibbs, Cheryl Gibbs 05/30/2016, 12:44 PM

## 2016-05-30 NOTE — Progress Notes (Signed)
Physical Therapy Session Note  Patient Details  Name: Cheryl Gibbs MRN: [id number] Date of Birth: 07/06/1990  Today's Date: 05/30/2016 PT Individual Time: 1100-1130 PT Individual Time Calculation (min): 30 min     Skilled Therapeutic Interventions/Progress Updates: Patient in bed, communicates that she does not want to participate in therapy, continuously complains of headache, nurse notified, came into the room and tried to assist in convincing patient to participate in therapy interventions. Patient continuously refuses. Assisted in donning pants, but after that asks to sleep.  Session shortened by 30 min.   Therapy Documentation Precautions:  Precautions Precautions: Fall Precaution Comments: helmet when OOB Restrictions Weight Bearing Restrictions: No   See Function Navigator for Current Functional Status.   Therapy/Group: Individual Therapy  Dorna MaiCzajkowska, [name] 05/30/2016, 12:29 PM

## 2016-05-31 ENCOUNTER — Inpatient Hospital Stay (HOSPITAL_COMMUNITY): Payer: Managed Care, Other (non HMO) | Admitting: Occupational Therapy

## 2016-05-31 LAB — GLUCOSE, CAPILLARY
Glucose-Capillary: 105 mg/dL — ABNORMAL HIGH (ref 65–99)
Glucose-Capillary: 105 mg/dL — ABNORMAL HIGH (ref 65–99)
Glucose-Capillary: 93 mg/dL (ref 65–99)
Glucose-Capillary: 95 mg/dL (ref 65–99)
Glucose-Capillary: 99 mg/dL (ref 65–99)

## 2016-05-31 NOTE — Progress Notes (Signed)
Occupational Therapy Session Note  Patient Details  Name: Cheryl ClinesLaura Gibbs MRN: [id number] Date of Birth: 01/25/1991  Today's Date: 05/31/2016 OT Individual Time: [id number] OT Individual Time Calculation (min): 33 min    Short Term Goals: Week 1:  OT Short Term Goal 1 (Week 1): Pt will be able to sit at EOB for 5 minutes with mod A to maintain balance. OT Short Term Goal 2 (Week 1): Pt will tolerate self bathing her LUE. OT Short Term Goal 3 (Week 1): Pt will don a shirt with mod A. OT Short Term Goal 4 (Week 1): Pt will sit to stand with 1 person A with max A. OT Short Term Goal 5 (Week 1): Pt will use R hand for self ROM of L fingers.      Skilled Therapeutic Interventions/Progress Updates:   Pt was lying in bed at time of arrival, communicated that she wanted to stand today, but unable to complete during tx due to requirement of 2 assist. Pt agreeable to complete oral care with [employer] raised. ADL items placed in left visual field to promote scanning. Pt instructed on hemi techniques for task, required overall Min A. Afterwards scanning activities completed with pt tracking meaningful room items from midline to left visual field. At end of session pt was left with all needs within reach and husband sleeping nearby.   Therapy Documentation Precautions:  Precautions Precautions: Fall Precaution Comments: helmet when OOB Restrictions Weight Bearing Restrictions: No    Pain: No expressions/written communications of pain during session  Pain Assessment Pain Assessment: 0-10 Pain Score: 3  Pain Type: Acute pain Pain Location: Back Pain Orientation: Lower Pain Descriptors / Indicators: Aching Pain Frequency: Constant Pain Onset: On-going Patients Stated Pain Goal: 0 Pain Intervention(s): Medication (See eMAR) ADL: ADL ADL Comments: refer to navigator    See Function Navigator for Current Functional Status.   Therapy/Group: Individual Therapy  Willye Javier A Jesenia Spera 05/31/2016, 12:41  PM

## 2016-05-31 NOTE — Progress Notes (Signed)
[place] PHYSICAL MEDICINE & REHABILITATION     PROGRESS NOTE    Subjective/Complaints: Remains aphasic , somnolent this am, boyfriend sleeping in room   ROS limited due to language  Objective: Vital Signs: Blood pressure 99/63, pulse 96, temperature 98.8 F (37.1 C), temperature source Oral, resp. rate 18, height 5\' 4"  (1.626 m), weight 87.8 kg (193 lb 8 oz), last menstrual period 05/01/2016, SpO2 97 %. No results found. No results for input(s): WBC, HGB, HCT, PLT in the last 72 hours. No results for input(s): NA, K, CL, GLUCOSE, BUN, CREATININE, CALCIUM in the last 72 hours.  Invalid input(s): CO CBG (last 3)   Recent Labs  05/30/16 1838 05/31/16 0002 05/31/16 0617  GLUCAP 98 99 105*    Wt Readings from Last 3 Encounters:  05/27/16 87.8 kg (193 lb 8 oz)  05/20/16 84.4 kg (186 lb 1.6 oz)    Physical Exam:  Constitutional: She appears well-developed and well-nourished.  HENT:  Mouth/Throat: Oropharynx is clear and moist.  Right crani incision stable-- no change, flat anteriorly , some subcut fluid posteriorly no drainage Eyes: Conjunctivae are normal. Pupils are equal, round, and reactive to light.    Neck: Neck supple.  Trach stoma closed without leak   Cardiovascular: RRR.   Respiratory: CTA  GI: Soft. Bowel sounds are normal. She exhibits no distension. There is no tenderness.  PEG small amt dried serous  drainage.  Musculoskeletal:  Decreased tenderness in extremities  Neurological: She is somnolent but opens eyes and follows simple commands, aphasic  Spastic left hemiparesis with tight left heel cord noted---remains 1/4 in general. Improved dysesthesias LUE and LLE with improved activity, tolerance of PROM.  Voluntary Left hallux flex/extMoves RUE/RLE freely  Skin: Skin is warm and dry.  Psychiatric: flat  Assessment/Plan: 1. 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 Physiatrist is providing close team supervision and 24 hour management of active medical problems listed below. Physiatrist and rehab team continue to assess barriers to discharge/monitor patient progress toward functional and medical goals.  Function:  Bathing Bathing position   Position: Bed  Bathing parts Body parts bathed by patient: Left arm, Chest, Abdomen, Front perineal area Body parts bathed by helper: Right arm, Buttocks, Right upper leg, Left upper leg, Right lower leg, Left lower leg, Back  Bathing assist Assist Level: 2 helpers      Upper Body Dressing/Undressing Upper body dressing   What is the patient wearing?: Button up shirt     Pull over shirt/dress - Perfomed by patient: Thread/unthread right sleeve Pull over shirt/dress - Perfomed by helper: Thread/unthread left sleeve, Put head through opening, Pull shirt over trunk Button up shirt - Perfomed by patient: Thread/unthread right sleeve Button up shirt - Perfomed by helper: Thread/unthread left sleeve, Pull shirt around back, Button/unbutton shirt    Upper body assist Assist Level:  (max a)      Lower Body Dressing/Undressing Lower body dressing   What is the patient wearing?: Pants     Pants- Performed by patient: Pull pants up/down Pants- Performed by helper: Thread/unthread right pants leg, Thread/unthread left pants leg, Pull pants up/down       Socks - Performed by helper: Don/doff right sock, Don/doff left sock   Shoes - Performed by helper: Don/doff right shoe, Don/doff left shoe, Fasten right, Fasten left   AFO - Performed by helper: Don/doff left AFO  Lower body assist Assist for lower body dressing: 2 Helpers      Toileting Toileting Toileting activity did not occur: Safety/medical concerns   Toileting steps completed by helper: Adjust clothing prior to toileting, Performs perineal hygiene, Adjust clothing after toileting     Toileting assist Assist level: Two helpers   Transfers Chair/bed transfer   Chair/bed transfer method: Lateral scoot Chair/bed transfer assist level: 2 helpers Chair/bed transfer assistive device:  (slideboard) Mechanical lift: Maximove   Locomotion Ambulation Ambulation activity did not occur: Safety/medical concerns   Max distance: 3 ft  Assist level: 2 helpers   Wheelchair   Type: Manual   Assist Level: Dependent (Pt equals 0%)  Cognition Comprehension Comprehension assist level: Understands basic 75 - 89% of the time/ requires cueing 10 - 24% of the time  Expression Expression assist level: Expresses basis less than 25% of the time/requires cueing >75% of the time.  Social Interaction Social Interaction assist level: Interacts appropriately 50 - 74% of the time - May be physically or verbally inappropriate.  Problem Solving Problem solving assist level: Solves basic 25 - 49% of the time - needs direction more than half the time to initiate, plan or complete simple activities  Memory Memory assist level: Recognizes or recalls 25 - 49% of the time/requires cueing 50 - 75% of the time     Medical Problem List and Plan: 1.  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  -cont CIR PT/OT/SLP  -continue soft helmet when OOB 2.  DVT Prophylaxis/Anticoagulation: Mechanical: Sequential compression devices, below knee Bilateral lower extremities. LLE dopplers 12/26 negative for DVT 3. Headaches/Pain Management: appears controlled  -maintain gabapentin at 300mg  TID for headaches and dysesthesias     -continue hydrocodone at 10mg   -behavioral mgt, perseverative component to pain and participation in therapy    4. Mood: LCSW to follow for evaluation and support when appropriate.  5. Neuropsych: This patient is not capable of making decisions on her own behalf. 6. Skin/Wound Care: routine pressure relief measures.  7.  Fluids/Electrolytes/Nutrition: Bolus TF's. maintain NPO for now.  -     8. VDRF:decannulated without issue, trach stoma closed 9. New onset seizures: Continue Keppra bid.  10. Spasticity/Neuropathy LUE/LLE: see above  -continue low dose baclofen 5mg  TID---tolerating thus far   .  LOS (Days) 11 A FACE TO FACE EVALUATION WAS PERFORMED  Erick Colace, MD 05/31/2016 8:42 AM

## 2016-06-01 ENCOUNTER — Inpatient Hospital Stay (HOSPITAL_COMMUNITY): Payer: Managed Care, Other (non HMO)

## 2016-06-01 ENCOUNTER — Inpatient Hospital Stay (HOSPITAL_COMMUNITY): Payer: Managed Care, Other (non HMO) | Admitting: Physical Therapy

## 2016-06-01 ENCOUNTER — Inpatient Hospital Stay (HOSPITAL_COMMUNITY): Payer: Managed Care, Other (non HMO) | Admitting: Speech Pathology

## 2016-06-01 ENCOUNTER — Inpatient Hospital Stay (HOSPITAL_COMMUNITY): Payer: Managed Care, Other (non HMO) | Admitting: Occupational Therapy

## 2016-06-01 LAB — GLUCOSE, CAPILLARY
GLUCOSE-CAPILLARY: 99 mg/dL (ref 65–99)
GLUCOSE-CAPILLARY: 93 mg/dL (ref 65–99)

## 2016-06-01 MED ORDER — BACLOFEN 10 MG PO TABS
10.0000 mg | ORAL_TABLET | Freq: Three times a day (TID) | ORAL | Status: DC
  Administered 2016-06-01 – 2016-06-07 (×18): 10 mg via ORAL
  Filled 2016-06-01 (×18): qty 1

## 2016-06-01 NOTE — Progress Notes (Signed)
[place] PHYSICAL MEDICINE & REHABILITATION     PROGRESS NOTE    Subjective/Complaints: Had headache over weekend. Slept somewhat last night. Husband wedged in bed with her again.    ROS remains limited due to language  Objective: Vital Signs: Blood pressure 105/67, pulse 78, temperature 98.6 F (37 C), temperature source Oral, resp. rate 18, height 5\' 4"  (1.626 m), weight 87.8 kg (193 lb 8 oz), last menstrual period 05/01/2016, SpO2 98 %. No results found. No results for input(s): WBC, HGB, HCT, PLT in the last 72 hours. No results for input(s): NA, K, CL, GLUCOSE, BUN, CREATININE, CALCIUM in the last 72 hours.  Invalid input(s): CO CBG (last 3)   Recent Labs  05/31/16 1831 05/31/16 2358 06/01/16 0534  GLUCAP 95 93 99    Wt Readings from Last 3 Encounters:  05/27/16 87.8 kg (193 lb 8 oz)  05/20/16 84.4 kg (186 lb 1.6 oz)    Physical Exam:  Constitutional: She appears well-developed and well-nourished.  HENT:  Mouth/Throat: Oropharynx is clear and moist.  Right crani incision stable-- no change  Eyes: Conjunctivae are normal. Pupils are equal, round, and reactive to light.    Neck: Neck supple.  Trach stoma closed k   Cardiovascular: RRR.   Respiratory: CTA  GI: Soft. Bowel sounds are normal. She exhibits no distension. There is no tenderness.  PEG clean.  Musculoskeletal:  Decreased tenderness in extremities  Neurological: Opens eyes occasionally. Non-verbal.  Spastic left hemiparesis with tight left heel cord noted---remains 1 to 1+/4 in general. Improved dysesthesias LUE and LLE but still some discomfort.  Voluntary Left hallux flex/ext on occasion. Moves RUE/RLE freely  Skin: Skin is warm and dry.  Psychiatric: flat, dfificult to engage  Assessment/Plan: 1. 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 Physiatrist is providing  close team supervision and 24 hour management of active medical problems listed below. Physiatrist and rehab team continue to assess barriers to discharge/monitor patient progress toward functional and medical goals.  Function:  Bathing Bathing position   Position: Bed  Bathing parts Body parts bathed by patient: Left arm, Chest, Abdomen, Front perineal area Body parts bathed by helper: Right arm, Buttocks, Right upper leg, Left upper leg, Right lower leg, Left lower leg, Back  Bathing assist Assist Level: 2 helpers      Upper Body Dressing/Undressing Upper body dressing   What is the patient wearing?: Button up shirt     Pull over shirt/dress - Perfomed by patient: Thread/unthread right sleeve Pull over shirt/dress - Perfomed by helper: Thread/unthread left sleeve, Put head through opening, Pull shirt over trunk Button up shirt - Perfomed by patient: Thread/unthread right sleeve Button up shirt - Perfomed by helper: Thread/unthread left sleeve, Pull shirt around back, Button/unbutton shirt    Upper body assist Assist Level:  (max a)      Lower Body Dressing/Undressing Lower body dressing   What is the patient wearing?: Pants     Pants- Performed by patient: Pull pants up/down Pants- Performed by helper: Thread/unthread right pants leg, Thread/unthread left pants leg, Pull pants up/down       Socks - Performed by helper: Don/doff right sock, Don/doff left sock   Shoes - Performed by helper: Don/doff right shoe, Don/doff left shoe, Fasten right, Fasten left   AFO - Performed by helper: Don/doff left AFO      Lower body assist Assist  for lower body dressing: 2 Helpers      Financial trader activity did not occur: Safety/medical concerns   Toileting steps completed by helper: Adjust clothing prior to toileting, Performs perineal hygiene, Adjust clothing after toileting    Toileting assist Assist level: Two helpers   Transfers Chair/bed transfer    Chair/bed transfer method: Lateral scoot Chair/bed transfer assist level: 2 helpers Chair/bed transfer assistive device:  (slideboard) Mechanical lift: Maximove   Locomotion Ambulation Ambulation activity did not occur: Safety/medical concerns   Max distance: 3 ft  Assist level: 2 helpers   Wheelchair   Type: Manual   Assist Level: Dependent (Pt equals 0%)  Cognition Comprehension Comprehension assist level: Understands basic 75 - 89% of the time/ requires cueing 10 - 24% of the time  Expression Expression assist level: Expresses basic 50 - 74% of the time/requires cueing 25 - 49% of the time. Needs to repeat parts of sentences.  Social Interaction Social Interaction assist level: Interacts appropriately 50 - 74% of the time - May be physically or verbally inappropriate.  Problem Solving Problem solving assist level: Solves basic 25 - 49% of the time - needs direction more than half the time to initiate, plan or complete simple activities  Memory Memory assist level: Recognizes or recalls 25 - 49% of the time/requires cueing 50 - 75% of the time     Medical Problem List and Plan: 1.  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  -cont CIR PT/OT/SLP    -continue soft helmet when OOB---no interventions  -Friday visit at [facility] for cranioplasty over next few weeks once edema decreases. No interventions recommended by Dr. Genevie Ann at present 2.  DVT Prophylaxis/Anticoagulation: Mechanical: Sequential compression devices, below knee Bilateral lower extremities. LLE dopplers 12/26 negative for DVT 3. Headaches/Pain Management: appears controlled  -maintain gabapentin at 300mg  TID for headaches and dysesthesias     -continue hydrocodone at 10mg   -behavioral mgt, perseverative component likely contributing to pain and participation in therapy    4. Mood: LCSW to follow for evaluation and support when appropriate.    -concerned that depression is still a major issue  -will increase zoloft to 100mg  daily 5. Neuropsych: This patient is not capable of making decisions on her own behalf. 6. Skin/Wound Care: routine pressure relief measures.  7. Fluids/Electrolytes/Nutrition: Bolus TF's. maintain NPO for now.  -     8. VDRF:decannulated without issue, trach stoma closed 9. New onset seizures: Continue Keppra bid.  10. Spasticity/Neuropathy LUE/LLE: see above  -continue low dose baclofen 5mg  TID---tolerating thus far--increase to 10mg  TID   .  LOS (Days) 12 A FACE TO FACE EVALUATION WAS PERFORMED  Faith Rogue T, MD 06/01/2016 8:55 AM

## 2016-06-01 NOTE — Progress Notes (Signed)
Occupational Therapy Weekly Progress Note  Patient Details  Name: Cheryl Gibbs MRN: [id number] Date of Birth: 02-02-1991  Beginning of progress report period: May 21, 2016 End of progress report period: June 01, 2016  Today's Date: 06/01/2016 OT Individual Time: 5284-1324 OT Individual Time Calculation (min): 60 min     Patient has met 2 of 5 short term goals.  Pt is progressing with her LUE/LLE pain tolerance and is now able to touch it without crying.  She is also progressing with her sit to stand skill of max A. She is making progress with her trunk control but continues to need max A with sit balance versus mod A, continues to need max A with UB dressing vs mod A, and needs cuing to fully range L fingers herself.  Patient continues to demonstrate the following deficits: muscle weakness, decreased cardiorespiratoy endurance, abnormal tone, decreased visual acuity, decreased visual perceptual skills and hemianopsia, decreased attention to left, decreased awareness and decreased memory and decreased sitting balance, decreased standing balance, decreased postural control and hemiplegia and therefore will continue to benefit from skilled OT intervention to enhance overall performance with BADL.  Patient progressing toward long term goals..  Continue plan of care.  OT Short Term Goals Week 1:  OT Short Term Goal 1 (Week 1): Pt will be able to sit at EOB for 5 minutes with mod A to maintain balance. OT Short Term Goal 1 - Progress (Week 1): Progressing toward goal OT Short Term Goal 2 (Week 1): Pt will tolerate self bathing her LUE. OT Short Term Goal 2 - Progress (Week 1): Met OT Short Term Goal 3 (Week 1): Pt will don a shirt with mod A. OT Short Term Goal 3 - Progress (Week 1): Progressing toward goal OT Short Term Goal 4 (Week 1): Pt will sit to stand with 1 person A with max A. OT Short Term Goal 4 - Progress (Week 1): Met OT Short Term Goal 5 (Week 1): Pt will use R hand for self  ROM of L fingers. OT Short Term Goal 5 - Progress (Week 1): Progressing toward goal Week 2:  OT Short Term Goal 1 (Week 2): Pt will be able to sit at EOB for 5 minutes with mod A to maintain balance OT Short Term Goal 2 (Week 2): Pt will don a shirt with mod A. OT Short Term Goal 3 (Week 2): Pt will use R hand for self ROM of L fingers. OT Short Term Goal 4 (Week 2): Pt will transfer onto a BSC with slide board with max A of 1 person.  OT Short Term Goal 5 (Week 2): Pt with sit to stand with mod A of 1 during toileting.       Therapy Documentation Precautions:  Precautions Precautions: Fall Precaution Comments: helmet when OOB Restrictions Weight Bearing Restrictions: No  ADL: ADL ADL Comments: refer to navigator  [place] 06/01/2016, 12:46 PM

## 2016-06-01 NOTE — Progress Notes (Addendum)
Physical Therapy Weekly Progress Note  Patient Details  Name: Cheryl Gibbs MRN: [id number] Date of Birth: 12-23-1990  Beginning of progress report period: May 21, 2016 End of progress report period: June 01, 2016 (late entry)  Today's Date: 06/01/2016  Patient has met 1 of 2 short term goals.  Pt is making slow progress towards long term goals. Pt continues to require significant assistance to transfer to sitting EOB and mod/max assist for static sitting. Pt has initiated standing & taking steps with rail in hallway with max assist +2. Pt is limited by significant tone in LLE and is unable to achieve neutral L ankle alignment. Pt is also limited by hypersensitivity in LUE/LLE. Pt would benefit from continued treatment to address balance, neuro re-ed, strengthening, endurance, safety awareness, cognitive remediation, and pt/family education.   Patient continues to demonstrate the following deficits muscle weakness and muscle joint tightness, decreased cardiorespiratoy endurance, impaired timing and sequencing, abnormal tone, unbalanced muscle activation, decreased coordination and decreased motor planning, decreased attention to left, left side neglect and decreased motor planning, decreased initiation, decreased attention, decreased awareness, decreased problem solving, decreased safety awareness, decreased memory and delayed processing, and decreased sitting balance, decreased standing balance, decreased postural control, hemiplegia and decreased balance strategies and therefore will continue to benefit from skilled PT intervention to increase functional independence with mobility.  Patient progressing toward long term goals. Continue plan of care.  PT Short Term Goals Week 1:  PT Short Term Goal 1 (Week 1): pt will perform sitting balance with mod A PT Short Term Goal 1 - Progress (Week 1): Progressing toward goal PT Short Term Goal 2 (Week 1): pt will assist with functional transfers 25% PT  Short Term Goal 2 - Progress (Week 1): Met (pt participates 25% in transfers but still requires +2 for safety) Week 2:  PT Short Term Goal 1 (Week 2): Pt will consistently demonstrate sitting balance with Mod assist. PT Short Term Goal 2 (Week 2): Pt will consistently perform functional transfers (bed<>w/c) with max assist +1. PT Short Term Goal 3 (Week 2): Pt will propel manual w/c 40 ft with supervision.  Precautions:  Precautions Precautions: Fall Precaution Comments: helmet when OOB Restrictions Weight Bearing Restrictions: No  Waunita Schooner 06/01/2016, 8:58 AM

## 2016-06-01 NOTE — Progress Notes (Signed)
Physical Therapy Session Note  Patient Details  Name: Cheryl Gibbs MRN: [id number] Date of Birth: 1990/11/04  Today's Date: 06/01/2016 PT Individual Time: 0905-1002 and 1331-1416 PT Individual Time Calculation (min): 57 min and 45 min   Short Term Goals: Week 1:  PT Short Term Goal 1 (Week 1): pt will perform sitting balance with mod A PT Short Term Goal 1 - Progress (Week 1): Progressing toward goal PT Short Term Goal 2 (Week 1): pt will assist with functional transfers 25% PT Short Term Goal 2 - Progress (Week 1): Met (pt participates 25% in transfers but still requires +2 for safety)  Skilled Therapeutic Interventions/Progress Updates:    Treatment 1: Pt received in bed & agreeable to tx. Pt with behaviors demonstrating LLE pain & RN made aware. Session focused on bed mobility, slide board & sit<>stand transfers, and w/c mobility. Pt required max assist to transfer supine>sit with max multimodal cuing & hospital bed features; pt demonstrates limited ability to pull to sitting with use of bed rail. Provided total assist for w/c & slide board set up, multimodal cuing for anterior weight shifting, head/hips relationship, & manual facilitation for weight shifting across board. Pt attempts to assist with movement but requires +2 assist for safety. Pt propelled w/c room>gym with R hemi technique & max assist/cuing to attend to L and for linear path. Pt completed sit<>stand with mod assist and rail in hallway to switch out manual w/c to TIS w/c. Pt able to achieve neutral L ankle ROM and place L foot completely flat on floor. At end of session pt left sitting in TIS w/c with QRB donned & husband present. Pt c/o her "tailbone hurting", repositioned pt in w/c & encouraged her to sit up as long as possible. Pt's husband Cheryl Gibbs) present and encouraging throughout session.  Treatment 2: Pt received in bed & agreeable to tx. Pt noted pain in LLE & RN notified. Session focused on bed mobility, transfers  (sit<>stand, slide board to L), LLE neuro re-ed, and gait. Pt required max assist & multimodal cuing to roll L (pt unable to assist with LLE/LUE) and for sidelying>sitting. Pt with improved ability to pull to sitting with bed rail as compared to this morning. Provided total assist for w/c & slide board set up. Educated pt on benefits of anterior weight shift & head/hips relationship for slide board transfer. Pt required multimodal cuing to shift weight across board; provided cuing for lifting buttocks up then sliding. Pt able to complete slide board transfer to L with max assist +1 but required +2 assist for safety. At rail in hallway pt required max assist to transfer sit<>stand with cuing for hand placement, anterior weight shift, and manual facilitation for upright posture & approximation at L knee. Pt requires total assist to advance LLE and max assist to take step with RLE; therapist provides manual facilitation for weight shifting L. Pt attempts to slide RLE forward instead of clearing foot and bearing weight through LLE. Pt begins to cry in pain when attempting to take steps and is unable to march in place with RLE. Pt returned to sitting in w/c & transported to room. Pt left in TIS in room with QRB donned, all needs within reach & RN present administering meds.   Therapy Documentation Precautions:  Precautions Precautions: Fall Precaution Comments: helmet when OOB Restrictions Weight Bearing Restrictions: No    See Function Navigator for Current Functional Status.   Therapy/Group: Individual Therapy  Waunita Schooner 06/01/2016, 5:11 PM

## 2016-06-01 NOTE — Progress Notes (Addendum)
Occupational Therapy Session Note  Patient Details  Name: Cheryl Gibbs MRN: [id number] Date of Birth: [birth year]  Today's Date: 06/01/2016 OT Individual Time: 1308-6578 OT Individual Time Calculation (min): 60 min     Short Term Goals: Week 1:  OT Short Term Goal 1 (Week 1): Pt will be able to sit at EOB for 5 minutes with mod A to maintain balance. OT Short Term Goal 2 (Week 1): Pt will tolerate self bathing her LUE. OT Short Term Goal 3 (Week 1): Pt will don a shirt with mod A. OT Short Term Goal 4 (Week 1): Pt will sit to stand with 1 person A with max A. OT Short Term Goal 5 (Week 1): Pt will use R hand for self ROM of L fingers.      Skilled Therapeutic Interventions/Progress Updates:    Pt received in w/c dressed and ready for day but very lethargic. Pt wrote she was very sleepy from medicines and requested to get in bed for therapy.  Pt's spouse present and able to help this therapist with mobility. Pt used slide board with +2 max assist. Once on bed, pt agreeable to working on sitting balance. Trunk control facilitated with tactile cues of pt leaning toward spouse on one side and therapist on other. Pt able to hold position without leaning back and able to wt shift to right for a few seconds at a time, but continues to need max A with balance.   Once positioned in bed, worked on bridging 12x with active L hip movement.  Pt requested bed pan and able to bridge with LLE stabilized for clothing management. Pt not able to void. No c/o pain in LLE and slight complaints in LUE with PROM. Tolerated arm massage and focusing on finger extension.   Evaluated vision further. She is having blurry vision with seeing objects 3 ft in front of her and beyond.  Improves with L eye covered.  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  Discussed spending more time working on L visual tracking and head turns.  Also difficulty reading texts,  Font adjusted on her cell phone to largest size.  Pt  adjusted in bed with pillows and all needs met. Bed alarm set.  Therapy Documentation Precautions:  Precautions Precautions: Fall Precaution Comments: helmet when OOB Restrictions Weight Bearing Restrictions: No   Pain: Pain Assessment Pain Assessment: 0-10 Pain Score: 4  Pain Type: Acute pain Pain Location: Leg Pain Orientation: Right Pain Frequency: Intermittent Pain Onset: With Activity Pain Intervention(s): Medication (See eMAR) ADL: ADL ADL Comments: refer to navigator  See Function Navigator for Current Functional Status.   Therapy/Group: Individual Therapy  [place] 06/01/2016, 12:35 PM

## 2016-06-01 NOTE — Progress Notes (Signed)
Speech Language Pathology Daily Session Note  Patient Details  Name: Cheryl Gibbs MRN: [id number] Date of Birth: 05/13/1991  Today's Date: 06/01/2016 SLP Individual Time: 0815-0900 SLP Individual Time Calculation (min): 45 min   Short Term Goals: Week 2: SLP Short Term Goal 1 (Week 2): Patient will scan to left field of enviornment to locate specific objects, etc with Mod A verbal and visual cues.  SLP Short Term Goal 2 (Week 2): Patient will demonstrate sustained attention to functional tasks for 5 minutes with Mod A verbal cues.  SLP Short Term Goal 3 (Week 2): Patient will vocalize on command in 25% of opportunities with Max A multimodal cues.  SLP Short Term Goal 4 (Week 2): Patient will demonstrate basic problem solving with Mod A verbal cues.  SLP Short Term Goal 5 (Week 2): Patient will consume current diet with miniaml overt s/s of aspiration and Mod A verbal cues for use of swallowing compensatory strategies  SLP Short Term Goal 6 (Week 2): Patient will self-monitor and correct erros during written expression with Min A multimodla cues.   Skilled Therapeutic Interventions: Skilled treatment session focused on dysphagia and speech goals. SLP facilitated session by providing tray set-up with breakfast meal of Dys. 1 textures with nectar-thick liquids. Patient with minimal PO intake, suspect due to patient disliking food options. Patient consumed meal with intermittent coughing throughout, suspect patient unable to produce strong enough cough initially which led to intermittent weak coughs throughout. Patient utilized small bites/sips and a slow rate of self-feeding with Mod I but continues to demonstrate what appeared to be a delayed swallow initiation. Patient's husband present and reported she verbalized, "I love you." SLP did not hear intelligible words, however, patient able to vocalize on command in 50% of opportunities! Patient utilized written expression for communication with extra time  and Mod A verbal cues to self-monitor and correct errors. Patient left supine in bed with all needs within reach. Continue with current plan of care.   Function:  Eating Eating   Modified Consistency Diet: Yes Eating Assist Level: Set up assist for;Supervision or verbal cues   Eating Set Up Assist For: Opening containers       Cognition Comprehension Comprehension assist level: Understands basic 75 - 89% of the time/ requires cueing 10 - 24% of the time  Expression Expression assistive device: Communication board Expression assist level: Expresses basic 50 - 74% of the time/requires cueing 25 - 49% of the time. Needs to repeat parts of sentences.  Social Interaction Social Interaction assist level: Interacts appropriately 50 - 74% of the time - May be physically or verbally inappropriate.  Problem Solving Problem solving assist level: Solves basic 25 - 49% of the time - needs direction more than half the time to initiate, plan or complete simple activities  Memory Memory assist level: Recognizes or recalls 25 - 49% of the time/requires cueing 50 - 75% of the time    Pain Pain Assessment Pain Assessment: 0-10 Pain Score: 4  Pain Type: Acute pain Pain Location: Leg Pain Orientation: Right Pain Frequency: Intermittent Pain Onset: With Activity Pain Intervention(s): Medication (See eMAR)  Therapy/Group: Individual Therapy  Cheryl Gibbs 06/01/2016, 12:29 PM

## 2016-06-02 ENCOUNTER — Inpatient Hospital Stay (HOSPITAL_COMMUNITY): Payer: Managed Care, Other (non HMO) | Admitting: Physical Therapy

## 2016-06-02 ENCOUNTER — Inpatient Hospital Stay (HOSPITAL_COMMUNITY): Payer: Managed Care, Other (non HMO) | Admitting: Occupational Therapy

## 2016-06-02 ENCOUNTER — Inpatient Hospital Stay (HOSPITAL_COMMUNITY): Payer: Managed Care, Other (non HMO) | Admitting: Speech Pathology

## 2016-06-02 LAB — GLUCOSE, CAPILLARY
GLUCOSE-CAPILLARY: 109 mg/dL — AB (ref 65–99)
GLUCOSE-CAPILLARY: 90 mg/dL (ref 65–99)
Glucose-Capillary: 103 mg/dL — ABNORMAL HIGH (ref 65–99)
Glucose-Capillary: 96 mg/dL (ref 65–99)
Glucose-Capillary: 93 mg/dL (ref 65–99)

## 2016-06-02 NOTE — Patient Care Conference (Signed)
Inpatient RehabilitationTeam Conference and Plan of Care Update Date: 06/02/2016   Time: 2:30 pm    Patient Name: Cheryl Gibbs      Medical Record Number: [id number]  Date of Birth: [birth year] Sex: Female         Room/Bed: 4W16C/4W16C-01 Payor Info: Payor: Monia Pouch / Plan: Derrek Gu / Product Type: *No Product type* /    Admitting Diagnosis: Postpartum  Admit Date/Time:  05/20/2016  5:38 PM Admission Comments: No comment available   Primary Diagnosis:  Hemorrhage, intracranial (HCC) Principal Problem: Hemorrhage, intracranial (HCC)  Patient Active Problem List   Diagnosis Date Noted  . Hemorrhage, intracranial (HCC) 05/20/2016  . Global aphasia   . Spastic hemiparesis affecting nondominant side (HCC)   . Vascular headache   . Dysphagia, post-stroke   . Tracheostomy status (HCC)   . Seizure (HCC)   . Neuropathy (HCC)   . Nonverbal     Expected Discharge Date: Expected Discharge Date:  (4+ weeks)  Team Members Present: Physician leading conference: Dr. Faith Rogue Social Worker Present: Amada Jupiter, LCSW Nurse Present: Carmie End, RN PT Present: Aleda Grana, PT OT Present: Ardis Rowan, COTA;Jennifer Katrinka Blazing, OT SLP Present: Feliberto Gottron, SLP PPS Coordinator present : Tora Duck, RN, CRRN     Current Status/Progress Goal Weekly Team Focus  Medical   trach out. still with dense left hemiparesis and associated pain. remains NPO. crani intact  improve engagement of LLE for mobitliy  pain, NS follow up. spasticity control, headaches   Bowel/Bladder   B/B incontinence at times. LBM 05/31/16.  Maintain B/B continence.  Offer toileting Q2HWA and PRN.   Swallow/Nutrition/ Hydration   Dys. 1 textures with nectar-thick liquids, Mod-Max A  Min A with least restrictive diet  tolerance of current diet, increased use of swallowing strategies, trials of upgraded textures/thins   ADL's   max A bed mobility, mod A static sitting EOB, +2 total A slide board transfers, max A bathing  and dressing bed level, max encouragement to participate  mod A standing balance, tub transfer, toilet transfer, LB dressing; min A bathing and UB dressing; max A toileting  ADL retraining, balance, trunk control, LUE PROM, activity tolerance, cognitive activities, pt/family ed   Mobility   +2 slide board transfers, max assist sit<>stand, +2 assist to ambulate 5 ft with rail in hallway, L hemi, max assist w/c mobility  supervision w/c mobility, min assist transfers  L NMR, activity tolerance, w/c mobility, transfers, balance, bed mobility   Communication   Overall Max A, uses written expression for communication with Mod A, can voice on command in 50% of opportunities  Mod I for multimodal communication  voicing, vowel production, functional communication    Safety/Cognition/ Behavioral Observations  Max A  Min A  problem solving, attention, awareness    Pain   Pain and tenderness to LUE. Hycet Q4H PRN.  Tolerable pain rating of 3/10.  Assess and monitor pain level using numeric scale Q shift and PRN.   Skin   Skin intact. PEG site without any s/s of infection. Sutures to head dry and intact with no drainage.  Maintain good skin care without any skin breakdown.  Assess skin Q shift and PRN. Change PEG dressing per order and PRN.    Rehab Goals Patient on target to meet rehab goals: Yes *See Care Plan and progress notes for long and short-term goals.  Barriers to Discharge: see prior    Possible Resolutions to Barriers:  see prior.  Discharge Planning/Teaching Needs:  Plan to d/c home to mother's house with spouse, mother and other family providing 24/7 assistance.  Teaching ongoing.   Team Discussion:  [facility] few weeks; monitoring edema.  MD and team concerned with possible depression.  D1, nectar but poor participation on swallow exam.  Written communication not as good today - ?reason.  +2 with mobility; nsg needs to use lift but recommend  using lift to get up for toileting as much as possible.  Goals likely to be revised but not at this point.  Per SW, husband returned to army unit an expected to be there several more weeks.  SW to follow up with pt's mother about care plan for d/c.  Revisions to Treatment Plan:  None   Continued Need for Acute Rehabilitation Level of Care: The patient requires daily medical management by a physician with specialized training in physical medicine and rehabilitation for the following conditions: Daily direction of a multidisciplinary physical rehabilitation program to ensure safe treatment while eliciting the highest outcome that is of practical value to the patient.: Yes Daily medical management of patient stability for increased activity during participation in an intensive rehabilitation regime.: Yes Daily analysis of laboratory values and/or radiology reports with any subsequent need for medication adjustment of medical intervention for : Neurological problems;Post surgical problems;Wound care problems;Pulmonary problems  Emmilia Sowder 06/02/2016, 3:48 PM

## 2016-06-02 NOTE — Progress Notes (Signed)
Physical Therapy Session Note  Patient Details  Name: Cheryl ClinesLaura Baeten MRN: [id number] Date of Birth: 06/02/1990  Today's Date: 06/02/2016 PT Individual Time: [id number] PT Individual Time Calculation (min): 30 min    Short Term Goals: Week 2:  PT Short Term Goal 1 (Week 2): Pt will consistently demonstrate sitting balance with Mod assist. PT Short Term Goal 2 (Week 2): Pt will consistently perform functional transfers (bed<>w/c) with max assist +1. PT Short Term Goal 3 (Week 2): Pt will propel manual w/c 40 ft with supervision.  Skilled Therapeutic Interventions/Progress Updates:   Pt presented in bed, nodding yes agreeable to therapy then closing eyes and turning head to side. Pt then nodding yes to sitting at EOB, however pointing to legs wanting pants donned. PTA threading pants with pt in supine, and pt able to perform single leg bridge and clear L side enough to pull pants over L buttock. Pt required maxA for rolling to R for clearance of R side. Attempted supine to sit maxA x1 to EOB on R side with pt able to independently slide RLE off bed, total dependent moving LLE.  Upon sitting pt moaning 2/2 pain in LLE with pt nodding yes when asked if want to return to bed. MaxA sit to supine with +2 boosting to HOB. Pt left in bed with call bed within reach.    Therapy Documentation Precautions:  Precautions Precautions: Fall Precaution Comments: helmet when OOB Restrictions Weight Bearing Restrictions: No General:     See Function Navigator for Current Functional Status.   Therapy/Group: Individual Therapy  Yoni Lobos  Shiven Junious, PTA  06/02/2016, 9:26 AM

## 2016-06-02 NOTE — Progress Notes (Signed)
Physical Therapy Session Note  Patient Details  Name: Cheryl Gibbs MRN: [id number] Date of Birth: Aug 17, 1990  Today's Date: 06/02/2016 PT Individual Time: 1104-1202 PT Individual Time Calculation (min): 58 min    Short Term Goals: Week 1:  PT Short Term Goal 1 (Week 1): pt will perform sitting balance with mod A PT Short Term Goal 1 - Progress (Week 1): Progressing toward goal PT Short Term Goal 2 (Week 1): pt will assist with functional transfers 25% PT Short Term Goal 2 - Progress (Week 1): Met (pt participates 25% in transfers but still requires +2 for safety)  Skilled Therapeutic Interventions/Progress Updates:    Pt received in bed with grandfather Mikki Santee) present for session. Pt attempted to communicate via dry erase board but unsuccessful. Pt noted pain in LLE, RN made aware & administered meds. Session focused on bed mobility, slide board transfer (bed>w/c on L), sit<>stand transfers, postural control, L attention and w/c mobility (manual w/c). Pt requires max assist to transfer supine>R sidelying>sitting EOB but with improved ability to push trunk upright to sitting. Pt continues to require max multimodal cuing for head/hips relationship and lifting buttocks then scooting across board. Pt requires max assist for cuing & manual facilitation & +2 for safety. Provided pt with more appropriate fitting manual w/c & pt able to propel w/c room>midwest unit with multiple rest breaks and mod assist for linear pathway. Pt with L inattention & pt reports being unable to see out of L eye due to blurry vision (RN made aware); therapist educated pt on compensatory techniques. Pt able to transfer sit<>stand at rail in hallway x 2 trials with max assist with facilitation for upright posture & blocking at L knee. Pt impulsive with movement requiring cuing for safety. At end of session pt left sitting in TIS w/c in room with max encouragement to sit up as long as possible. QRB donned & grandfather present to  supervise, all needs within reach.    Therapy Documentation Precautions:  Precautions Precautions: Fall Precaution Comments: helmet when OOB Restrictions Weight Bearing Restrictions: No   See Function Navigator for Current Functional Status.   Therapy/Group: Individual Therapy  Waunita Schooner 06/02/2016, 12:27 PM

## 2016-06-02 NOTE — Progress Notes (Signed)
Occupational Therapy Session Note  Patient Details  Name: Cheryl ClinesLaura Gibbs MRN: [id number] Date of Birth: 11/12/1990  Today's Date: 06/02/2016 OT Individual Time: [id number] OT Individual Time Calculation (min): 25 min  and Today's Date: 06/02/2016 OT Missed Time: 35 Minutes Missed Time Reason: Patient fatigue     Short Term Goals:Week 2:  OT Short Term Goal 1 (Week 2): Pt will be able to sit at EOB for 5 minutes with mod A to maintain balance OT Short Term Goal 2 (Week 2): Pt will don a shirt with mod A. OT Short Term Goal 3 (Week 2): Pt will use R hand for self ROM of L fingers. OT Short Term Goal 4 (Week 2): Pt will transfer onto a BSC with slide board with max A of 1 person.  OT Short Term Goal 5 (Week 2): Pt with sit to stand with mod A of 1 during toileting.  Skilled Therapeutic Interventions/Progress Updates:    Pt received in bed sound asleep.  Attempted numerous strategies to wake pt with washing face, neck, LUE.  Pt would not open eyes. She finally did and motioned for her communication board.  Wrote "tired".  Pt closed eyes.  Worked on gentle PROM to LUE and pt would not open eyes only grimaced with full extension of her fingers and partial external rotation of shoulder. Scapula gliding well.  Could not wake pt. Pt in bed with alarm set .    Therapy Documentation Precautions:  Precautions Precautions: Fall Precaution Comments: helmet when OOB Restrictions Weight Bearing Restrictions: No   Vital Signs: Therapy Vitals Temp: 98.4 F (36.9 C) Temp Source: Oral Pulse Rate: (!) 56 Resp: 17 BP: 101/75 Patient Position (if appropriate): Lying Oxygen Therapy SpO2: 97 % O2 Device: Not Delivered Pain: pt made faces with external rotation PROM of L arm, did not push ROM past pain   ADL: ADL ADL Comments: refer to navigator  See Function Navigator for Current Functional Status.   Therapy/Group: Individual Therapy  SAGUIER,JULIA 06/02/2016, 8:31 AM

## 2016-06-02 NOTE — Progress Notes (Signed)
Speech Language Pathology Daily Session Note  Patient Details  Name: Ned ClinesLaura Linam MRN: [id number] Date of Birth: 12/12/1990  Today's Date: 06/02/2016 SLP Individual Time: 0730-0830 SLP Individual Time Calculation (min): 60 min   Short Term Goals: Week 2: SLP Short Term Goal 1 (Week 2): Patient will scan to left field of enviornment to locate specific objects, etc with Mod A verbal and visual cues.  SLP Short Term Goal 2 (Week 2): Patient will demonstrate sustained attention to functional tasks for 5 minutes with Mod A verbal cues.  SLP Short Term Goal 3 (Week 2): Patient will vocalize on command in 25% of opportunities with Max A multimodal cues.  SLP Short Term Goal 4 (Week 2): Patient will demonstrate basic problem solving with Mod A verbal cues.  SLP Short Term Goal 5 (Week 2): Patient will consume current diet with miniaml overt s/s of aspiration and Mod A verbal cues for use of swallowing compensatory strategies  SLP Short Term Goal 6 (Week 2): Patient will self-monitor and correct erros during written expression with Min A multimodla cues.   Skilled Therapeutic Interventions: Skilled treatment session focused on dysphagia, functional communication and cognitive goals. Upon arrival, patient was awake while supine in bed. Patient agreeable to consume breakfast meal of Dys. 1 textures with nectar-thick liquids. Patient consumed meal without overt s/s of aspiration and required encouragement for PO intake and Min A to attend to left anterior spillage. Recommend patient continue current diet. Patient demonstrated increased difficulty with expressing basic wants/needs via written expression and required Max A multimodal cues to self-monitor and correct errors. Patient also required Max A verbal cues for problem solving in regards to appropriately using her cell phone. Patient left supine in bed with all needs within reach. Continue with current plan of care.   Function:  Eating Eating   Modified  Consistency Diet: Yes Eating Assist Level: Set up assist for;Supervision or verbal cues   Eating Set Up Assist For: Opening containers       Cognition Comprehension Comprehension assist level: Understands basic 75 - 89% of the time/ requires cueing 10 - 24% of the time  Expression Expression assistive device: Communication board Expression assist level: Expresses basic 50 - 74% of the time/requires cueing 25 - 49% of the time. Needs to repeat parts of sentences.  Social Interaction Social Interaction assist level: Interacts appropriately 50 - 74% of the time - May be physically or verbally inappropriate.  Problem Solving Problem solving assist level: Solves basic 25 - 49% of the time - needs direction more than half the time to initiate, plan or complete simple activities  Memory Memory assist level: Recognizes or recalls 25 - 49% of the time/requires cueing 50 - 75% of the time    Pain Pain Assessment Pain Assessment: 0-10 Pain Score: 6  Faces Pain Scale: Hurts whole lot Pain Type: Acute pain Pain Location: Leg Pain Orientation: Right Pain Descriptors / Indicators: Aching Pain Onset: On-going Patients Stated Pain Goal: 2 Pain Intervention(s): Medication (See eMAR)  Therapy/Group: Individual Therapy  Stanley Helmuth 06/02/2016, 2:47 PM

## 2016-06-02 NOTE — Progress Notes (Signed)
[place] PHYSICAL MEDICINE & REHABILITATION     PROGRESS NOTE    Subjective/Complaints: Up with SLP. Trying to write something down on dry erase board. Fair night apparently. Husband went back to his base  ROS limited by language  Objective: Vital Signs: Blood pressure 101/75, pulse (!) 56, temperature 98.4 F (36.9 C), temperature source Oral, resp. rate 17, height 5\' 4"  (1.626 m), weight 87.8 kg (193 lb 8 oz), last menstrual period 05/01/2016, SpO2 97 %. No results found. No results for input(s): WBC, HGB, HCT, PLT in the last 72 hours. No results for input(s): NA, K, CL, GLUCOSE, BUN, CREATININE, CALCIUM in the last 72 hours.  Invalid input(s): CO CBG (last 3)   Recent Labs  05/31/16 2358 06/01/16 0534 06/01/16 1245  GLUCAP 93 99 93    Wt Readings from Last 3 Encounters:  05/27/16 87.8 kg (193 lb 8 oz)  05/20/16 84.4 kg (186 lb 1.6 oz)    Physical Exam:  Constitutional: She appears well-developed and well-nourished.  HENT:  Mouth/Throat: Oropharynx is clear and moist.  Right crani incision stable-- no change  Eyes: Conjunctivae are normal. Pupils are equal, round, and reactive to light.    Neck: Neck supple.  Trach stoma closed k   Cardiovascular: RRR.   Respiratory: CTA  GI: Soft. Bowel sounds are normal. She exhibits no distension. There is no tenderness.  PEG clean.  Musculoskeletal:  Decreased tenderness in extremities  Neurological: attempted to verbalizes. Reacts with smile to joke.Marland Kitchen.  Spastic left hemiparesis with tight left heel cord noted---remains  1+/4 LLE and tr/4 LUE. Improved dysesthesias LUE and LLE but still some discomfort more so in the leg. Voluntary left toe movement? Moves RUE/RLE freely  Skin: Skin is warm and dry.  Psychiatric: flat, dfificult to engage  Assessment/Plan: 1. Dense left hemiparesis and aphasia secondary to right frontal IPH/IVH which require 3+ hours per day of interdisciplinary therapy in a comprehensive inpatient  rehab setting. Physiatrist is providing close team supervision and 24 hour management of active medical problems listed below. Physiatrist and rehab team continue to assess barriers to discharge/monitor patient progress toward functional and medical goals.  Function:  Bathing Bathing position   Position: Bed  Bathing parts Body parts bathed by patient: Left arm, Chest, Abdomen, Front perineal area Body parts bathed by helper: Right arm, Buttocks, Right upper leg, Left upper leg, Right lower leg, Left lower leg, Back  Bathing assist Assist Level: 2 helpers      Upper Body Dressing/Undressing Upper body dressing   What is the patient wearing?: Button up shirt     Pull over shirt/dress - Perfomed by patient: Thread/unthread right sleeve Pull over shirt/dress - Perfomed by helper: Thread/unthread left sleeve, Put head through opening, Pull shirt over trunk Button up shirt - Perfomed by patient: Thread/unthread right sleeve Button up shirt - Perfomed by helper: Thread/unthread left sleeve, Pull shirt around back, Button/unbutton shirt    Upper body assist Assist Level:  (max a)      Lower Body Dressing/Undressing Lower body dressing   What is the patient wearing?: Pants     Pants- Performed by patient: Pull pants up/down Pants- Performed by helper: Thread/unthread right pants leg, Thread/unthread left pants leg, Pull pants up/down       Socks - Performed by helper: Don/doff right sock, Don/doff left sock   Shoes - Performed by helper: Don/doff right shoe, Don/doff left shoe, Fasten right, Fasten left   AFO - Performed by helper: Don/doff left  AFO      Lower body assist Assist for lower body dressing: 2 Helpers      Toileting Toileting Toileting activity did not occur: Safety/medical concerns   Toileting steps completed by helper: Adjust clothing prior to toileting, Performs perineal hygiene, Adjust clothing after toileting    Toileting assist Assist level: Two helpers    Transfers Chair/bed transfer   Chair/bed transfer method: Lateral scoot Chair/bed transfer assist level: 2 helpers (max assist & +2 for safety) Chair/bed transfer assistive device: Armrests, Sliding board Mechanical lift: Maximove   Locomotion Ambulation Ambulation activity did not occur: Safety/medical concerns   Max distance: 2 ft Assist level: 2 helpers (max assist & +2 for safety)   Wheelchair   Type: Manual Max wheelchair distance: 100 ft Assist Level: Maximal assistance (Pt 25 - 49%)  Cognition Comprehension Comprehension assist level: Understands basic 75 - 89% of the time/ requires cueing 10 - 24% of the time  Expression Expression assist level: Expresses basic 50 - 74% of the time/requires cueing 25 - 49% of the time. Needs to repeat parts of sentences.  Social Interaction Social Interaction assist level: Interacts appropriately 50 - 74% of the time - May be physically or verbally inappropriate.  Problem Solving Problem solving assist level: Solves basic 25 - 49% of the time - needs direction more than half the time to initiate, plan or complete simple activities  Memory Memory assist level: Recognizes or recalls 25 - 49% of the time/requires cueing 50 - 75% of the time     Medical Problem List and Plan: 1.  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  -cont CIR PT/OT/SLP--team conference today    -continue soft helmet when OOB--  -Friday visit at [facility] for cranioplasty over next few weeks once edema decreases. No interventions recommended by Dr. Genevie Ann at [facility] likely coordinate this with discharge home.  2.  DVT Prophylaxis/Anticoagulation: Mechanical: Sequential compression devices, below knee Bilateral lower extremities. LLE dopplers 12/26 negative for DVT 3. Headaches/Pain Management: appears controlled  -maintain gabapentin at 300mg  TID for headaches and dysesthesias      -continue hydrocodone at 10mg   -behavioral component.    4. Mood: LCSW to follow for evaluation and support when appropriate.   -concerned that depression is still a major issue  -increased zoloft to 100mg  daily 5. Neuropsych: This patient is not capable of making decisions on her own behalf. 6. Skin/Wound Care: routine pressure relief measures.  7. Fluids/Electrolytes/Nutrition: Bolus TF's. maintain NPO for now.  -     8. VDRF:  trach stoma closed 9. New onset seizures: Continue Keppra bid.  10. Spasticity/Neuropathy LUE/LLE: see above  -continue  baclofen at 10mg  TID---tolerating thus far--will need further titration   -LLE ROM, splinting    .  LOS (Days) 13 A FACE TO FACE EVALUATION WAS PERFORMED  Ranelle Oyster, MD 06/02/2016 8:57 AM

## 2016-06-03 ENCOUNTER — Inpatient Hospital Stay (HOSPITAL_COMMUNITY): Payer: Managed Care, Other (non HMO) | Admitting: Occupational Therapy

## 2016-06-03 ENCOUNTER — Inpatient Hospital Stay (HOSPITAL_COMMUNITY): Payer: Managed Care, Other (non HMO)

## 2016-06-03 ENCOUNTER — Inpatient Hospital Stay (HOSPITAL_COMMUNITY): Payer: Managed Care, Other (non HMO) | Admitting: Speech Pathology

## 2016-06-03 ENCOUNTER — Inpatient Hospital Stay (HOSPITAL_COMMUNITY): Payer: Managed Care, Other (non HMO) | Admitting: Physical Therapy

## 2016-06-03 LAB — GLUCOSE, CAPILLARY
GLUCOSE-CAPILLARY: 91 mg/dL (ref 65–99)
Glucose-Capillary: 104 mg/dL — ABNORMAL HIGH (ref 65–99)
Glucose-Capillary: 112 mg/dL — ABNORMAL HIGH (ref 65–99)
Glucose-Capillary: 86 mg/dL (ref 65–99)

## 2016-06-03 LAB — CBC
HCT: 42.1 % (ref 36.0–46.0)
HEMOGLOBIN: 13 g/dL (ref 12.0–15.0)
MCH: 27.8 pg (ref 26.0–34.0)
MCHC: 30.9 g/dL (ref 30.0–36.0)
MCV: 90 fL (ref 78.0–100.0)
PLATELETS: 351 10*3/uL (ref 150–400)
RBC: 4.68 MIL/uL (ref 3.87–5.11)
RDW: 15.6 % — ABNORMAL HIGH (ref 11.5–15.5)
WBC: 6.8 10*3/uL (ref 4.0–10.5)

## 2016-06-03 LAB — BASIC METABOLIC PANEL
ANION GAP: 10 (ref 5–15)
BUN: 11 mg/dL (ref 6–20)
CHLORIDE: 106 mmol/L (ref 101–111)
CO2: 25 mmol/L (ref 22–32)
CREATININE: 0.72 mg/dL (ref 0.44–1.00)
Calcium: 9.7 mg/dL (ref 8.9–10.3)
GFR calc non Af Amer: >60 mL/min (ref >60)
Glucose, Bld: 100 mg/dL — ABNORMAL HIGH (ref 65–99)
Potassium: 3.8 mmol/L (ref 3.5–5.1)
SODIUM: 141 mmol/L (ref 135–145)

## 2016-06-03 NOTE — Progress Notes (Signed)
[place] PHYSICAL MEDICINE & REHABILITATION     PROGRESS NOTE    Subjective/Complaints: No new issues. Working with therapy early this morning. Had a reasonable night  ROS limited by cognition  Objective: Vital Signs: Blood pressure 106/76, pulse 67, temperature 98.7 F (37.1 C), temperature source Oral, resp. rate 18, height 5\' 4"  (1.626 m), weight 87.8 kg (193 lb 8 oz), last menstrual period 05/01/2016, SpO2 99 %. No results found.  Recent Labs  06/03/16 0925  WBC 6.8  HGB 13.0  HCT 42.1  PLT 351    Recent Labs  06/03/16 0925  NA 141  K 3.8  CL 106  GLUCOSE 100*  BUN 11  CREATININE 0.72  CALCIUM 9.7   CBG (last 3)   Recent Labs  06/02/16 2331 06/03/16 0545 06/03/16 1136  GLUCAP 90 104* 86    Wt Readings from Last 3 Encounters:  05/27/16 87.8 kg (193 lb 8 oz)  05/20/16 84.4 kg (186 lb 1.6 oz)    Physical Exam:  Constitutional: She appears well-developed and well-nourished.  HENT:  Mouth/Throat: Oropharynx is clear and moist.  Right crani incision stable, fluid underneath-- no change  Eyes: PERRL    Neck: Neck supple.  Trach stoma closed    Cardiovascular: RRR.   Respiratory: CTA  GI: Soft. Bowel sounds are normal. She exhibits no distension. There is no tenderness.  PEG clean.  Musculoskeletal:  Decreased tenderness in extremities  Neurological: attempted to verbalizes. Reacts with smile to joke.Marland Kitchen  Spastic left hemiparesis trace LUE and 1+/4 LLE but still some discomfort more so in the leg.  Moves RUE/RLE freely  Skin: Skin is warm and dry.  Psychiatric: a little more animated,engaging  Assessment/Plan: 1. 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 Physiatrist is providing close team supervision and 24 hour management of active medical problems listed below. Physiatrist and rehab team continue to assess  barriers to discharge/monitor patient progress toward functional and medical goals.  Function:  Bathing Bathing position   Position: Bed  Bathing parts Body parts bathed by patient: Left arm, Chest, Abdomen, Front perineal area Body parts bathed by helper: Right arm, Buttocks, Right upper leg, Left upper leg, Right lower leg, Left lower leg, Back  Bathing assist Assist Level: 2 helpers      Upper Body Dressing/Undressing Upper body dressing   What is the patient wearing?: Button up shirt     Pull over shirt/dress - Perfomed by patient: Thread/unthread right sleeve Pull over shirt/dress - Perfomed by helper: Thread/unthread left sleeve, Put head through opening, Pull shirt over trunk Button up shirt - Perfomed by patient: Thread/unthread right sleeve Button up shirt - Perfomed by helper: Thread/unthread left sleeve, Pull shirt around back, Button/unbutton shirt    Upper body assist Assist Level:  (max a)      Lower Body Dressing/Undressing Lower body dressing   What is the patient wearing?: Pants     Pants- Performed by patient: Pull pants up/down Pants- Performed by helper: Thread/unthread right pants leg, Thread/unthread left pants leg, Pull pants up/down       Socks - Performed by helper: Don/doff right sock, Don/doff left sock   Shoes - Performed by helper: Don/doff right shoe, Don/doff left shoe, Fasten right, Fasten left   AFO - Performed by helper: Don/doff left AFO      Lower body assist Assist for lower body dressing: 2 Helpers  Toileting Toileting Toileting activity did not occur: Safety/medical concerns   Toileting steps completed by helper: Adjust clothing prior to toileting, Performs perineal hygiene, Adjust clothing after toileting (sat on BSC put pt in too much hip pain and did not void) Toileting Assistive Devices: Other (comment) (bariatric stedy)  Toileting assist Assist level: Two helpers   Transfers Chair/bed transfer   Chair/bed transfer  method: Lateral scoot Chair/bed transfer assist level: 2 helpers Chair/bed transfer assistive device: Sliding board Mechanical lift: Maximove   Locomotion Ambulation Ambulation activity did not occur: Safety/medical concerns   Max distance: 2 ft Assist level: 2 helpers (max assist & +2 for safety)   Wheelchair   Type: Manual Max wheelchair distance: 150 ft Assist Level: Moderate assistance (Pt 50 - 74%)  Cognition Comprehension Comprehension assist level: Understands basic 75 - 89% of the time/ requires cueing 10 - 24% of the time  Expression Expression assist level: Expresses basic 75 - 89% of the time/requires cueing 10 - 24% of the time. Needs helper to occlude trach/needs to repeat words.  Social Interaction Social Interaction assist level: Interacts appropriately 50 - 74% of the time - May be physically or verbally inappropriate.  Problem Solving Problem solving assist level: Solves basic 25 - 49% of the time - needs direction more than half the time to initiate, plan or complete simple activities  Memory Memory assist level: Recognizes or recalls 25 - 49% of the time/requires cueing 50 - 75% of the time     Medical Problem List and Plan: 1.  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  -cont CIR PT/OT/SLP    -continue  helmet when OOB--  -Dr. Lionel DecemberFargen, [facility]---cranioplasty in a few weeks. .  2.  DVT Prophylaxis/Anticoagulation: Mechanical: Sequential compression devices, below knee Bilateral lower extremities. LLE dopplers 12/26 negative for DVT 3. Headaches/Pain Management: appears controlled  -maintain gabapentin at 300mg  TID for headaches and dysesthesias     -continue hydrocodone at 10mg   -behavioral component at times.    4. Mood: LCSW to follow for evaluation and support when appropriate.   -concerned that depression is still a major issue  -increased zoloft to 100mg  daily 5. Neuropsych: This  patient is not capable of making decisions on her own behalf. 6. Skin/Wound Care: routine pressure relief measures.  7. Fluids/Electrolytes/Nutrition:   D1/nectars per SLP  - will discontinue TF to stimulate appetite    8. VDRF:  trach stoma closed 9. New onset seizures: Continue Keppra bid.  10. Spasticity/Neuropathy LUE/LLE: see above  -continue  baclofen at 10mg  TID--titrate further as tolerated  -LLE ROM, splinting    .  LOS (Days) 14 A FACE TO FACE EVALUATION WAS PERFORMED  Ranelle OysterSWARTZ,Ramia Sidney T, MD 06/03/2016 12:39 PM

## 2016-06-03 NOTE — Progress Notes (Signed)
Occupational Therapy Session Note  Patient Details  Name: Cheryl ClinesLaura Alipio MRN: [id number] Date of Birth: 08/29/1990  Today's Date: 06/03/2016 OT Individual Time: [id number] OT Individual Time Calculation (min): 73 min     Short Term Goals:Week 2:  OT Short Term Goal 1 (Week 2): Pt will be able to sit at EOB for 5 minutes with mod A to maintain balance OT Short Term Goal 2 (Week 2): Pt will don a shirt with mod A. OT Short Term Goal 3 (Week 2): Pt will use R hand for self ROM of L fingers. OT Short Term Goal 4 (Week 2): Pt will transfer onto a BSC with slide board with max A of 1 person.  OT Short Term Goal 5 (Week 2): Pt with sit to stand with mod A of 1 during toileting.  Skilled Therapeutic Interventions/Progress Updates:    Pt seen this session to facilitate functional mobility skills for bed mobility and sit to stand. Pt in w/c, wrote bathroom on communication board and nodded yes that she would like to sit on BSC.  Pt set up with bariatric stedy, but it took considerable time as she was constantly pointing to various body parts, writing illegibly on her board. It took quite some time to get pt comfortable.  Initially pt not trying to pull her self to standing, and this therapist needed to be very firm with her telling she had to put in her full effort as +2 A not available at this time.  Pt did pull to stand with max A and needed max facilitation to prevent push to her L. Sat on BSC and immediately screamed out in pain pointing to her L hip. Made multiple attempts to adjust her hips but pt crying.  Pt transferred back to bed so she could use bed pan. Worked on bridging and rolling for bed pan placement. Pt agreeable to trying a padded open seat tub bench as that is softer. Used stedy to transfer to that and with clothing on she nodded yes that was tolerable to sit on. Placed stedy in front of mirror for pt to see her L lean and work on Forensic psychologistself correction. Transferred back to tilt in space. Completed  oral care and washing face with cues/ supervision.  Did not tolerate washing L hand well.  Pillow under arm, stuffed animal in L hand to keep grasp open, chair reclined back, quick release belt on.    Therapy Documentation Precautions:  Precautions Precautions: Fall Precaution Comments: helmet when OOB Restrictions Weight Bearing Restrictions: No    Vital Signs: Therapy Vitals Temp: 98.7 F (37.1 C) Temp Source: Oral Pulse Rate: 67 Resp: 18 BP: 106/76 Patient Position (if appropriate): Lying Oxygen Therapy SpO2: 99 % O2 Device: Not Delivered Pain: Pain Assessment Pain Score: 9  - RN aware ADL: ADL ADL Comments: refer to navigator   See Function Navigator for Current Functional Status.   Therapy/Group: Individual Therapy  Geraldene Eisel 06/03/2016, 8:52 AM

## 2016-06-03 NOTE — Progress Notes (Signed)
Recreational Therapy Session Note  Patient Details  Name: Cheryl ClinesLaura Mcgourty MRN: [id number] Date of Birth: 05/31/1990 Today's Date: 06/03/2016  Pain: no c/o Skilled Therapeutic Interventions/Progress Updates:  Pt participated in animal assisted activity/therapy bed level with supervision.  Pt scanning and attempting reposition in bed with Mod I to pet the dog.  Pt conversing with pet partner team via writing on dry erase board with min questioning cues for clarification.  Pt appreciative of the visit. Uzziel Russey 06/03/2016, 3:08 PM

## 2016-06-03 NOTE — Progress Notes (Signed)
Speech Language Pathology Daily Session Note  Patient Details  Name: Cheryl Gibbs MRN: [id number] Date of Birth: April 10, 1991  Today's Date: 06/03/2016 SLP Individual Time: 0930-1015 SLP Individual Time Calculation (min): 45 min   Short Term Goals: Week 2: SLP Short Term Goal 1 (Week 2): Patient will scan to left field of enviornment to locate specific objects, etc with Mod A verbal and visual cues.  SLP Short Term Goal 2 (Week 2): Patient will demonstrate sustained attention to functional tasks for 5 minutes with Mod A verbal cues.  SLP Short Term Goal 3 (Week 2): Patient will vocalize on command in 25% of opportunities with Max A multimodal cues.  SLP Short Term Goal 4 (Week 2): Patient will demonstrate basic problem solving with Mod A verbal cues.  SLP Short Term Goal 5 (Week 2): Patient will consume current diet with miniaml overt s/s of aspiration and Mod A verbal cues for use of swallowing compensatory strategies  SLP Short Term Goal 6 (Week 2): Patient will self-monitor and correct erros during written expression with Min A multimodla cues.   Skilled Therapeutic Interventions:Skilled treatment session focused on dysphgaig and speech goals. SLP facilitated session by providing skilled observation of puree and soft solid (mac & cheese) items. Husband had left mac & cheese with nursing. Pt required Mod A verbal cues with eat bite of mac & cheese for increased attention to bolus and increased lingual manipulation of bolus. After 1/2 of serving, pt with facial grimace and gestured to her neck that appeared to be indicative that a piece of pasta was stuck. Pt didn't appear in any no distress but obvious discomfort and attempts to throat clear. Pt unable to produce volitional throat clear or cough. Liquid was was effective in clearing bolus. Continue to recommend puree items only - do not attempt to mash up any soft solids into puree form. Pt consumed nectar thick liquids without overt s/s of  aspiration. Pt able to produce voicing with Max A multimodal cues but none of the voicing was shaped into meaning sound. With Mod A verbal cues, pt able to sustain attention to functional tasks for ~3 minutes. Pt's ability to self-monitor her writing appears to have declined over previous session I had with pt (05/06/16). Pt effective in communicating via witting for ~75% d/t legibility. Pt left in nursing care, upright in wheelchair with quick release belt on.    Function:  Eating Eating   Modified Consistency Diet: Yes Eating Assist Level: Set up assist for;Supervision or verbal cues;Helper checks for pocketed food   Eating Set Up Assist For: Opening containers;Cutting food       Cognition Comprehension Comprehension assist level: Understands basic 75 - 89% of the time/ requires cueing 10 - 24% of the time  Expression Expression assistive device: Communication board Expression assist level: Expresses basic 75 - 89% of the time/requires cueing 10 - 24% of the time. Needs helper to occlude trach/needs to repeat words.  Social Interaction Social Interaction assist level: Interacts appropriately 50 - 74% of the time - May be physically or verbally inappropriate.  Problem Solving Problem solving assist level: Solves basic 25 - 49% of the time - needs direction more than half the time to initiate, plan or complete simple activities  Memory Memory assist level: Recognizes or recalls 25 - 49% of the time/requires cueing 50 - 75% of the time    Pain Pain Assessment Pain Score: 8  Faces Pain Scale: Hurts even more  Therapy/Group: Individual Therapy  Heather Streeper 06/03/2016, 1:25 PM

## 2016-06-03 NOTE — Progress Notes (Signed)
Physical Therapy Session Note  Patient Details  Name: Cheryl ClinesLaura Gibbs MRN: [id number] Date of Birth: 12/04/1990  Today's Date: 06/03/2016 PT Individual Time: [id number] PT Individual Time Calculation (min): 60 min    Short Term Goals: Week 2:  PT Short Term Goal 1 (Week 2): Pt will consistently demonstrate sitting balance with Mod assist. PT Short Term Goal 2 (Week 2): Pt will consistently perform functional transfers (bed<>w/c) with max assist +1. PT Short Term Goal 3 (Week 2): Pt will propel manual w/c 40 ft with supervision.  Skilled Therapeutic Interventions/Progress Updates:    Pt received asleep in bed, requiring max verbal & tactile stimulation to awaken. Pt required max encouragement and assist to roll L/R in bed to allow therapist to don pants total assist. Pt transferred supine>sidelying>sitting EOB with mod assist. Pt transferred sit<>stand in stedy lift with +2 assist; pt with increased tone in LLE. Educated pt on importance of donning PRAFO boot at night to prevent contractures. Pt transferred room<>gym total assist and performed squat pivot transfers to R (w/c<>mat table) with +2 assist; pt able to generate enough force to lift buttocks off of seat but required assist for pivoting. Pt sat edge of mat x 5 minutes with mirror for visual feedback for postural control and balance. Pt requires mod<>max assist and max multimodal cuing for static sitting with poor ability to correct posture; pt with poor head & trunk control. At end of session pt left sitting in TIS w/c with QRB donned & all needs within reach.   Pt crying when LLE touched, RN made aware & meds administered during session.   Pt requires max encouragement & motivation to actively participate in session.   Therapy Documentation Precautions:  Precautions Precautions: Fall Precaution Comments: helmet when OOB Restrictions Weight Bearing Restrictions: No   See Function Navigator for Current Functional  Status.   Therapy/Group: Individual Therapy  [name] 06/03/2016, 12:55 PM

## 2016-06-03 NOTE — Progress Notes (Addendum)
Physical Therapy Note  Patient Details  Name: Cheryl Gibbs MRN: [id number] Date of Birth: 08/27/1990 Today's Date: 06/03/2016  1425-1500, 35 min Pain: pt indicated LLE hurt, unable to rate  Pt used white board throughout session to answer questions accurately with extra time. neuromuscular re-education via multimodal cues for 10 x 1 modified abdominal crunches,  R unilateral bridging with PT holding wt of LLE.  RLE straight leg raises and ankle pumps against resistance. prom L heel cord x 30 seconds x 2, and limited L quad PROM with knee ext/flex.  , L hip abduction/adduction with thigh resting on pillow.  Pt tolerated touching and movement of LLE well, given targets and duration- goals ahead of time.  Pt left resting in bed with alarm set and all needs within reach.   Cheryl Gibbs 06/03/2016, 2:41 PM

## 2016-06-04 ENCOUNTER — Inpatient Hospital Stay (HOSPITAL_COMMUNITY): Payer: Managed Care, Other (non HMO) | Admitting: *Deleted

## 2016-06-04 ENCOUNTER — Inpatient Hospital Stay (HOSPITAL_COMMUNITY): Payer: Managed Care, Other (non HMO) | Admitting: Speech Pathology

## 2016-06-04 ENCOUNTER — Inpatient Hospital Stay (HOSPITAL_COMMUNITY): Payer: Managed Care, Other (non HMO) | Admitting: Physical Therapy

## 2016-06-04 ENCOUNTER — Inpatient Hospital Stay (HOSPITAL_COMMUNITY): Payer: Managed Care, Other (non HMO) | Admitting: Occupational Therapy

## 2016-06-04 LAB — GLUCOSE, CAPILLARY
GLUCOSE-CAPILLARY: 107 mg/dL — AB (ref 65–99)
GLUCOSE-CAPILLARY: 91 mg/dL (ref 65–99)
Glucose-Capillary: 114 mg/dL — ABNORMAL HIGH (ref 65–99)
Glucose-Capillary: 149 mg/dL — ABNORMAL HIGH (ref 65–99)

## 2016-06-04 MED ORDER — FREE WATER
100.0000 mL | Freq: Three times a day (TID) | Status: DC
  Administered 2016-06-04 – 2016-06-10 (×25): 100 mL

## 2016-06-04 MED ORDER — GABAPENTIN 250 MG/5ML PO SOLN
400.0000 mg | Freq: Three times a day (TID) | ORAL | Status: DC
  Administered 2016-06-04 – 2016-06-08 (×13): 400 mg via ORAL
  Filled 2016-06-04 (×14): qty 8

## 2016-06-04 NOTE — Progress Notes (Signed)
[facility]IC[facility] & REHABILITATION     PROGRESS NOTE    Subjective/Complaints: Had a pretty good night. Enjoyed seeing the dog yesterday with RT!. Still having pain in left arm/leg.  ROS: pt denies nausea, vomiting, diarrhea, cough, shortness of breath or chest pain   Objective: Vital Signs: Blood pressure 108/62, pulse (!) 50, temperature 98.6 F (37 C), temperature source Oral, resp. rate 18, height 5\' 4"  (1.626 m), weight 87.8 kg (193 lb 8 oz), last menstrual period 05/01/2016, SpO2 99 %. No results found.  Recent Labs  06/03/16 0925  WBC 6.8  HGB 13.0  HCT 42.1  PLT 351    Recent Labs  06/03/16 0925  NA 141  K 3.8  CL 106  GLUCOSE 100*  BUN 11  CREATININE 0.72  CALCIUM 9.7   CBG (last 3)   Recent Labs  06/03/16 1726 06/03/16 2354 06/04/16 0547  GLUCAP 91 112* 107*    Wt Readings from Last 3 Encounters:  05/27/16 87.8 kg (193 lb 8 oz)  05/20/16 84.4 kg (186 lb 1.6 oz)    Physical Exam:  Constitutional: She appears well-developed and well-nourished.  HENT:  Mouth/Throat: Oropharynx is clear and moist.  Right crani incision with stable appearance  Eyes: PERRL    Neck: Neck supple.  Trach stoma closed    Cardiovascular: RRR.   Respiratory: CTA  GI: Soft. Bowel sounds are normal. She exhibits no distension. There is no tenderness.  PEG clean.  Musculoskeletal:  Decreased tenderness in extremities  Neurological: verbalizing to me in response to questions with one word answers. Making more eye contact.  Spastic left hemiparesis trace LUE and 1/4 LLE. Has discomfort/hypersensitivity with ROM of LUE/LLE  Moves RUE/RLE freely  Skin: Skin is warm and dry.  Psychiatric: much more engaging today. smiling  Assessment/Plan: 1. 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 Physiatrist is providing close team  supervision and 24 hour management of active medical problems listed below. Physiatrist and rehab team continue to assess barriers to discharge/monitor patient progress toward functional and medical goals.  Function:  Bathing Bathing position   Position: Bed  Bathing parts Body parts bathed by patient: Left arm, Chest, Abdomen, Front perineal area Body parts bathed by helper: Right arm, Buttocks, Right upper leg, Left upper leg, Right lower leg, Left lower leg, Back  Bathing assist Assist Level: 2 helpers      Upper Body Dressing/Undressing Upper body dressing   What is the patient wearing?: Button up shirt     Pull over shirt/dress - Perfomed by patient: Thread/unthread right sleeve Pull over shirt/dress - Perfomed by helper: Thread/unthread left sleeve, Put head through opening, Pull shirt over trunk Button up shirt - Perfomed by patient: Thread/unthread right sleeve Button up shirt - Perfomed by helper: Thread/unthread left sleeve, Pull shirt around back, Button/unbutton shirt    Upper body assist Assist Level:  (max a)      Lower Body Dressing/Undressing Lower body dressing   What is the patient wearing?: Pants     Pants- Performed by patient: Pull pants up/down Pants- Performed by helper: Thread/unthread right pants leg, Thread/unthread left pants leg, Pull pants up/down       Socks - Performed by helper: Don/doff right sock, Don/doff left sock   Shoes - Performed by helper: Don/doff right shoe, Don/doff left shoe, Fasten right, Fasten left   AFO - Performed by  helper: Don/doff left AFO      Lower body assist Assist for lower body dressing: 2 Helpers      Toileting Toileting Toileting activity did not occur: Safety/medical concerns   Toileting steps completed by helper: Adjust clothing prior to toileting, Performs perineal hygiene, Adjust clothing after toileting (sat on BSC put pt in too much hip pain and did not void) Toileting Assistive Devices: Other  (comment) (bariatric stedy)  Toileting assist Assist level: Two helpers   Transfers Chair/bed transfer   Chair/bed transfer method: Other, Squat pivot Chair/bed transfer assist level: 2 helpers Chair/bed transfer assistive device: Sliding board Mechanical lift: Landscape architecttedy   Locomotion Ambulation Ambulation activity did not occur: Safety/medical concerns   Max distance: 2 ft Assist level: 2 helpers (max assist & +2 for safety)   Wheelchair   Type: Manual Max wheelchair distance: 150 ft Assist Level: Moderate assistance (Pt 50 - 74%)  Cognition Comprehension Comprehension assist level: Understands basic 75 - 89% of the time/ requires cueing 10 - 24% of the time  Expression Expression assist level: Expresses basic 75 - 89% of the time/requires cueing 10 - 24% of the time. Needs helper to occlude trach/needs to repeat words.  Social Interaction Social Interaction assist level: Interacts appropriately 50 - 74% of the time - May be physically or verbally inappropriate.  Problem Solving Problem solving assist level: Solves basic 25 - 49% of the time - needs direction more than half the time to initiate, plan or complete simple activities  Memory Memory assist level: Recognizes or recalls 25 - 49% of the time/requires cueing 50 - 75% of the time     Medical Problem List and Plan: 1.  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  -cont CIR PT/OT/SLP therapies--increased engagement today   -continue  helmet when OOB--  -Dr. Lionel DecemberFargen, [facility]---cranioplasty in a few weeks. .  2.  DVT Prophylaxis/Anticoagulation: Mechanical: Sequential compression devices, below knee Bilateral lower extremities. LLE dopplers 12/26 negative for DVT 3. Headaches/Pain Management: appears controlled  -increase gabapentin to 400mg  TID for headaches and dysesthesias     -continue hydrocodone at 10mg   -behavioral component at times.    4. Mood: LCSW to  follow for evaluation and support when appropriate.   -concerned that depression is still a major issue  -increased zoloft to 100mg  daily 5. Neuropsych: This patient is not capable of making decisions on her own behalf. 6. Skin/Wound Care: routine pressure relief measures.  7. Fluids/Electrolytes/Nutrition:   D1/nectars per SLP  -stopped TF. Ate very well yesterday 50-100%.   -reduce water flushes    8. VDRF:  trach stoma closed 9. New onset seizures: Continue Keppra bid.  10. Spasticity/Neuropathy LUE/LLE: see above  -continue  baclofen at 10mg  TID--titrate further as tolerated  -LLE ROM, splinting    .  LOS (Days) 15 A FACE TO FACE EVALUATION WAS PERFORMED  Ranelle OysterSWARTZ,ZACHARY T, MD 06/04/2016 9:02 AM

## 2016-06-04 NOTE — Progress Notes (Signed)
Speech Language Pathology Daily Session Note  Patient Details  Name: Cheryl ClinesLaura Laabs MRN: [id number] Date of Birth: 09/03/1990  Today's Date: 06/04/2016 SLP Individual Time: 1130-1200 SLP Individual Time Calculation (min): 30 min   Short Term Goals: Week 2: SLP Short Term Goal 1 (Week 2): Patient will scan to left field of enviornment to locate specific objects, etc with Mod A verbal and visual cues.  SLP Short Term Goal 2 (Week 2): Patient will demonstrate sustained attention to functional tasks for 5 minutes with Mod A verbal cues.  SLP Short Term Goal 3 (Week 2): Patient will vocalize on command in 25% of opportunities with Max A multimodal cues.  SLP Short Term Goal 4 (Week 2): Patient will demonstrate basic problem solving with Mod A verbal cues.  SLP Short Term Goal 5 (Week 2): Patient will consume current diet with miniaml overt s/s of aspiration and Mod A verbal cues for use of swallowing compensatory strategies  SLP Short Term Goal 6 (Week 2): Patient will self-monitor and correct erros during written expression with Min A multimodla cues.   Skilled Therapeutic Interventions: Skilled treatment session focused on speech goals. Patient initiated verbalizing "hi" upon clinician's arrival to the room. Patient vocalized on command in 100% of opportunities. Patient demonstrates a low vocal intensity and oral-motor weakness which impacts her overall intelligibility at the word and phrase level to ~20%. Patient named functional items in 25% of opportunities with extra time and omission of initial phoneme. Patient also independently verbalized at the phrase level X 2 during informal conversation. Patient left supine in bed with all needs within reach. Continue with current plan of care.   Function:   Cognition Comprehension Comprehension assist level: Understands basic 75 - 89% of the time/ requires cueing 10 - 24% of the time  Expression Expression assistive device: Communication  board Expression assist level: Expresses basic 75 - 89% of the time/requires cueing 10 - 24% of the time. Needs helper to occlude trach/needs to repeat words.  Social Interaction Social Interaction assist level: Interacts appropriately 50 - 74% of the time - May be physically or verbally inappropriate.  Problem Solving Problem solving assist level: Solves basic 25 - 49% of the time - needs direction more than half the time to initiate, plan or complete simple activities  Memory Memory assist level: Recognizes or recalls 25 - 49% of the time/requires cueing 50 - 75% of the time    Pain No/Denies Pain   Therapy/Group: Individual Therapy  Kanyia Heaslip 06/04/2016, 3:16 PM

## 2016-06-04 NOTE — Progress Notes (Signed)
Occupational Therapy Session Note  Patient Details  Name: [name] MRN: [id number] Date of Birth: 01/19/1991  Today's Date: 06/04/2016 OT Individual Time: 4098-11910830-0945 OT Individual Time Calculation (min): 75 min     Short Term Goals:Week 2:  OT Short Term Goal 1 (Week 2): Pt will be able to sit at EOB for 5 minutes with mod A to maintain balance OT Short Term Goal 2 (Week 2): Pt will don a shirt with mod A. OT Short Term Goal 3 (Week 2): Pt will use R hand for self ROM of L fingers. OT Short Term Goal 4 (Week 2): Pt will transfer onto a BSC with slide board with max A of 1 person.  OT Short Term Goal 5 (Week 2): Pt with sit to stand with mod A of 1 during toileting.  Skilled Therapeutic Interventions/Progress Updates:    Pt seen this session to facilitate sit to stand and trunk control with ADL training.  Pt received in chair and worked on oral care with set up at sink with use of suction tube to fully clear mouth.  Yesterday, pt practiced sitting on open seat tub bench and did not c/o pain. She was agreeable to trying it today. Bench placed over toilet. 2 helpers today to manage moving pt in stedy lift from w/c to bench.  Pt used R hand on bar to pull up with mod A.  Improved postural control in stedy with less leaning.  When pt sits on pads of steady, she is in a fixed posterior tilt with severe trunk curvature of R trunk shortening with L hip tilted down so all her body wt is on L hip.  This posture was more exaggerated sitting on bench over toilet and pt crying out in pain. Attempted to wedge hips with towels but pt still uncomfortable. She was able to tolerate long enough for quick sponge bath. Unable to void on toilet. Transferred back to w/c with Stedy to complete dressing. Sit to stand at end of bed using bed rail for support, stood with mod A.  Adjusted pt in w/c with wedge under left hip and pillow under L arm.  Applied belt and all of her personal comfort items in place. Pt wrote on  her board, "dont be mad. Have to pee" Since OT session over, contact nursing to use bed pan with pt.   Therapy Documentation Precautions:  Precautions Precautions: Fall Precaution Comments: helmet when OOB Restrictions Weight Bearing Restrictions: No    Vital Signs: Therapy Vitals Temp: 98.6 F (37 C) Temp Source: Oral Pulse Rate: (!) 50 Resp: 18 BP: 108/62 Patient Position (if appropriate): Lying Oxygen Therapy SpO2: 99 % O2 Device: Not Delivered Pain: C/o L hip when sitting on padded open seat tub bench ADL: ADL ADL Comments: refer to navigator  See Function Navigator for Current Functional Status.   Therapy/Group: Individual Therapy  SAGUIER,JULIA 06/04/2016, 8:29 AM

## 2016-06-04 NOTE — Progress Notes (Signed)
Occupational Therapy Session Note  Patient Details  Name: Cheryl ClinesLaura Gibbs MRN: [id number] Date of Birth: 09/10/1990  Today's Date: 06/04/2016 OT Individual Time: [id number] OT Individual Time Calculation (min): 31 min     Skilled Therapeutic Interventions/Progress Updates:    Pt completed supine to sit on EOB with max assist.  Max demonstrational cueing with max assist for rolling from supine to the right side after placing her helmet on.  Once sitting she needed overall min assist to maintain static sitting balance.  Increased posterior pelvic tilt and posterior lean in sitting.  She completed transfer to the wheelchair scoot pivot with max assist.  Max demonstrational cueing for forward trunk flexion before attempting to initiate scooting.  Pt left in wheelchair with safety belt in place and soft touch call button in reach.        Therapy Documentation Precautions:  Precautions Precautions: Fall Precaution Comments: helmet when OOB Restrictions Weight Bearing Restrictions: No  Pain: Pain Assessment Pain Assessment: Faces Faces Pain Scale: Hurts little more Pain Type: Acute pain Pain Location: Hand Pain Orientation: Left Pain Descriptors / Indicators: Discomfort Pain Intervention(s): Repositioned ADL: See Function Navigator for Current Functional Status.   Therapy/Group: Individual Therapy  Cheryl Gibbs OTR/L 06/04/2016, 4:22 PM

## 2016-06-04 NOTE — Progress Notes (Signed)
Physical Therapy Note  Patient Details  Name: Cheryl ClinesLaura Gibbs MRN: [id number] Date of Birth: 02/15/1991 Today's Date: 06/04/2016    Time: 730-830 60 minutes  1:1 No c/o pain. Pt still with hypersensitivity to touch in Lt UE and LE.  Pt able to perform rolling with max A to don pants.  Supine to sti with max A, initially mod A for balance EOB but able to progress to min A.  Pt performed squat pivot transfer with +2 assist for lifting and wt shift.  Pt performed standing balance with wt shifts with focus on improving proprioception and strength through Lt LE with max A. Pt able to stand 2 minutes before requiring seated rest.  Pt then asks to eat breakfast. Pt requires cues for attention to Lt side of mouth, assist for set up with tray, increased time and cuing for small bites/sips during eating. Pt with improved communication throughotu session, able to verbalize yes/no and correctly write on white board for needs and wants. Pt seems in better spirits this session.   Xitlali Kastens 06/04/2016, 8:25 AM

## 2016-06-04 NOTE — Progress Notes (Signed)
Nutrition Follow-up  DOCUMENTATION CODES:   Obesity unspecified  INTERVENTION:  Let pt attempt to eat at meals, if po intake is <50% at that meal provide a bolus feed via PEG at volume of 300 ml given up to TID after meals.   Continue 30 ml Prostat per tube.   Continue free water flushes of 100 ml QID.   NUTRITION DIAGNOSIS:   Inadequate oral intake related to inability to eat as evidenced by NPO status; diet advanced; po 30-100%  GOAL:   Patient will meet greater than or equal to 90% of their needs; met  MONITOR:   TF tolerance, Labs, Weight trends, Skin, I & O's  REASON FOR ASSESSMENT:   Consult Enteral/tube feeding initiation and management  ASSESSMENT:   26 year old female who was 10 days post partum and  was transferred to [facility] from OSH on 04/11/16  after being found unresponsive by husband.  Patient in bathtub--not submerged and activity seizing. 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  She underwent cerebral angiogram and decline neurologically shortly after admission to ICU. She was taken to OR emergently for right craniotomy for evacuation of IPH and obliteration of ruptured lenticulostriate aneurysm and placement of EVD. Trach and PEG were placed on 11/22.  She was admitted to [facility] on 05/01/16. She is non verbal.  Meal completion has been varied from 25-100%. PO intake has been improving however. Pt reports having a good appetite with no other difficulties. Pt does report wanting more food choices at meals. RD to make note in [facility] to give pt more food options to choose from as we have a variety of pureed molds. HS TF bolus has been discontinued. PRN boluses to be continued if po intake <50% at meals. Free water flushes have been reduced via MD.  Will additionally continue with Prostat to aid in adequate protein needs.   Labs and medications reviewed.   Diet Order:  DIET - DYS 1 Room service appropriate? Yes; Fluid consistency: Nectar Thick  Skin:   (Incision on head R)  Last BM:  1/9  Height:   Ht Readings from Last 1 Encounters:  05/20/16 5' 4" (1.626 m)    Weight:   Wt Readings from Last 1 Encounters:  05/27/16 193 lb 8 oz (87.8 kg)    Ideal Body Weight:  54.5 kg  BMI:  Body mass index is 33.21 kg/m.  Estimated Nutritional Needs:   Kcal:  1850-2050  Protein:  90-105 grams  Fluid:  1.8 - 2 L/day  EDUCATION NEEDS:   No education needs identified at this time  Cheryl Parker, MS, RD, LDN Pager # [phone] After hours/ weekend pager # [phone]

## 2016-06-04 NOTE — Progress Notes (Signed)
Social Work Patient ID: Ned ClinesLaura Delis, female   DOB: 12/20/1990, 26 y.o.   MRN: [id number]  Spoke with pt's mother, Elberta LeatherwoodGina Sanguinetti, yesterday afternoon to review team conf and further discuss d/c plans.  Mother understands that team does not yet feel they can set a targeted d/c date but hope to do so next week.  Still estimating further LOS of 4+ weeks.  Confirmed with mother that pt's spouse did return to basic training and will not complete this until April.  Questioned mother about how they will cover 24/7 care for pt.  She reports that she hopes "we can get some in home nursing for her...".  Reminded mother, per our initial meeting, that pt's insurance does not cover caregiver in the home.  Reviewed coverage of skilled services only.  She then questions ".. Maybe her disability will help...".  Educated her on pending SSD status and that she should not anticipate approval anytime soon.  (She had hoped the income would "help to pay somebody", however, I also addressed the cost of private duty caregiver.)   I discussed with mother the POSSIBLE option of SNF if they cannot cover 24/7 care and she quickly states, "Oh no, I don't want to do that."  Stressed to mother that she will need to coordinate a plan of 24/7 care and she notes that she had planned to take some time off work.  She will discuss with family and pt's spouse.  I will follow up with her next week.  Toma Erichsen, LCSW

## 2016-06-04 NOTE — Progress Notes (Signed)
Speech Language Pathology Daily Session Note  Patient Details  Name: Cheryl Gibbs MRN: [id number] Date of Birth: 06/18/1990  Today's Date: 06/04/2016 SLP Individual Time: 1449-1530 SLP Individual Time Calculation (min): 41 min   Short Term Goals: Week 2: SLP Short Term Goal 1 (Week 2): Patient will scan to left field of enviornment to locate specific objects, etc with Mod A verbal and visual cues.  SLP Short Term Goal 2 (Week 2): Patient will demonstrate sustained attention to functional tasks for 5 minutes with Mod A verbal cues.  SLP Short Term Goal 3 (Week 2): Patient will vocalize on command in 25% of opportunities with Max A multimodal cues.  SLP Short Term Goal 4 (Week 2): Patient will demonstrate basic problem solving with Mod A verbal cues.  SLP Short Term Goal 5 (Week 2): Patient will consume current diet with miniaml overt s/s of aspiration and Mod A verbal cues for use of swallowing compensatory strategies  SLP Short Term Goal 6 (Week 2): Patient will self-monitor and correct erros during written expression with Min A multimodla cues.   Skilled Therapeutic Interventions: Pt was seen for skilled ST targeting communication goals.  Pt was able to verbalize the names of 2 familiar items in their entirety with increased time.  Pt was also able to verbalize 2 of her children's names with omission of initial consonant. Pt was stimulable for production of phonemes in isolation with max assist multimodal cues for ~25% accuracy.  Upon return to her room, pt's son Cheryl Gibbs was present and pt was able to spontaneously produce "I love you," in response.   Pt was returned to room and transferred back to bed with assistance from nurse tech.  Pt's family at bedside upon therapist's departure.  Continue per current plan of care.    Function:  Eating Eating                 Cognition Comprehension Comprehension assist level: Understands basic 90% of the time/cues < 10% of the time  Expression  Expression assistive device: Communication board Expression assist level: Expresses basic 75 - 89% of the time/requires cueing 10 - 24% of the time. Needs helper to occlude trach/needs to repeat words.  Social Interaction Social Interaction assist level: Interacts appropriately 50 - 74% of the time - May be physically or verbally inappropriate.  Problem Solving Problem solving assist level: Solves basic 25 - 49% of the time - needs direction more than half the time to initiate, plan or complete simple activities  Memory Memory assist level: Recognizes or recalls 25 - 49% of the time/requires cueing 50 - 75% of the time    Pain Pain Assessment Pain Assessment: No/denies pain  Therapy/Group: Individual Therapy  Cheryl Gibbs, Cheryl Gibbs 06/04/2016, 3:52 PM

## 2016-06-05 ENCOUNTER — Inpatient Hospital Stay (HOSPITAL_COMMUNITY): Payer: Managed Care, Other (non HMO) | Admitting: Occupational Therapy

## 2016-06-05 ENCOUNTER — Inpatient Hospital Stay (HOSPITAL_COMMUNITY): Payer: Managed Care, Other (non HMO)

## 2016-06-05 ENCOUNTER — Inpatient Hospital Stay (HOSPITAL_COMMUNITY): Payer: Managed Care, Other (non HMO) | Admitting: Speech Pathology

## 2016-06-05 ENCOUNTER — Inpatient Hospital Stay (HOSPITAL_COMMUNITY): Payer: Managed Care, Other (non HMO) | Admitting: Physical Therapy

## 2016-06-05 LAB — GLUCOSE, CAPILLARY
GLUCOSE-CAPILLARY: 95 mg/dL (ref 65–99)
GLUCOSE-CAPILLARY: 93 mg/dL (ref 65–99)
GLUCOSE-CAPILLARY: 94 mg/dL (ref 65–99)

## 2016-06-05 NOTE — Progress Notes (Signed)
Speech Language Pathology Weekly Progress and Session Note  Patient Details  Name: Cheryl Gibbs MRN: [id number] Date of Birth: February 26, 1991  Beginning of progress report period: May 29, 2016 End of progress report period: June 06, 2015  Today's Date: 06/05/2016 SLP Individual Time: 1000-1100 SLP Individual Time Calculation (min): 60 min   Short Term Goals: Week 2: SLP Short Term Goal 1 (Week 2): Patient will scan to left field of enviornment to locate specific objects, etc with Mod A verbal and visual cues.  SLP Short Term Goal 1 - Progress (Week 2): Met SLP Short Term Goal 2 (Week 2): Patient will demonstrate sustained attention to functional tasks for 5 minutes with Mod A verbal cues.  SLP Short Term Goal 2 - Progress (Week 2): Met SLP Short Term Goal 3 (Week 2): Patient will vocalize on command in 25% of opportunities with Max A multimodal cues.  SLP Short Term Goal 3 - Progress (Week 2): Met SLP Short Term Goal 4 (Week 2): Patient will demonstrate basic problem solving with Mod A verbal cues.  SLP Short Term Goal 4 - Progress (Week 2): Not met SLP Short Term Goal 5 (Week 2): Patient will consume current diet with miniaml overt s/s of aspiration and Mod A verbal cues for use of swallowing compensatory strategies  SLP Short Term Goal 5 - Progress (Week 2): Met SLP Short Term Goal 6 (Week 2): Patient will self-monitor and correct erros during written expression with Min A multimodla cues.  SLP Short Term Goal 6 - Progress (Week 2): Met    New Short Term Goals: Week 3: SLP Short Term Goal 1 (Week 3): Patient will scan to left field of enviornment to locate specific objects, etc with Min A verbal and visual cues.  SLP Short Term Goal 2 (Week 3): Patient will demonstrate sustained attention to functional tasks for 10 minutes with Min A verbal cues.  SLP Short Term Goal 3 (Week 3): Patient will demonstrate basic problem solving with Min A verbal cues.  SLP Short Term Goal 4 (Week 3):  Patient will self-monitor and correct erros during written expression with supervision multimodal cues.  SLP Short Term Goal 5 (Week 3): Patient will verbalize at the word level with 50% intelligibility and Max A multimodal cues.  SLP Short Term Goal 6 (Week 3): Patient will consume current diet with miniaml overt s/s of aspiration and supervision verbal cues for use of swallowing compensatory strategies   Weekly Progress Updates: Patient has made excellent gains and has met 5 of 6 STG's this reporting period due to improved communication, swallowing and cognitive function. Currently, patient is expressing her basic wants/needs with written expression with extra time and Min A verbal cues needed to self-monitor and correct errors. Patient is also vocalizing on command and spontaneously and verbalizing at the word level with Max A multimodal cues and extra time. Patient also requires overall Mod A multimodal cues for sustained attention but continues to require Max verbal cues for problem solving. Patient is consuming Dys. 1 textures with nectar-thick liquids with minimal overt s/s of aspiration and Min A verbal cues for use of swallowing compensatory strategies. Patient and family education is ongoing. Patient would benefit from continued skilled SLP intervention to maximize her cognitive and swallowing function as well as her functional communication prior to discharge.    Intensity: Minumum of 1-2 x/day, 30 to 90 minutes Frequency: 3 to 5 out of 7 days Duration/Length of Stay: 4-6 weeks Treatment/Interventions: Functional tasks;Patient/family education;Cueing hierarchy;Dysphagia/aspiration  precaution training;Therapeutic Activities;Cognitive remediation/compensation;Internal/external aids;Speech/Language facilitation   Daily Session  Skilled Therapeutic Interventions: Skilled treatment session focused on dysphagia and cognitive goals. Upon arrival, patient was consuming her breakfast meal of Dys. 1  textures with nectar-thick liquids. Patient consumed meal without overt s/s of aspiration and Min A verbal cues to monitor left anterior spillage and small bites/sips. Patient also participated in a basic money management task and required Max A verbal cues for problem solving. Patient demonstrated increased spontaneous verbalizations at the word and phrase level and required Mod A verbal cues for intellgibility. Patient reported she needed to use the bathroom and was transferred to the bed [facility] the [facility]. Patient left on bedpan with all needs within reach. Continue with current plan of care.     Function:   Eating Eating   Modified Consistency Diet: Yes Eating Assist Level: More than reasonable amount of time;Set up assist for;Helper checks for pocketed food;Supervision or verbal cues   Eating Set Up Assist For: Opening containers;Cutting food       Cognition Comprehension Comprehension assist level: Understands basic 90% of the time/cues < 10% of the time  Expression Expression assistive device: Communication board Expression assist level: Expresses basic 75 - 89% of the time/requires cueing 10 - 24% of the time. Needs helper to occlude trach/needs to repeat words.  Social Interaction Social Interaction assist level: Interacts appropriately 50 - 74% of the time - May be physically or verbally inappropriate.  Problem Solving Problem solving assist level: Solves basic 25 - 49% of the time - needs direction more than half the time to initiate, plan or complete simple activities  Memory Memory assist level: Recognizes or recalls 25 - 49% of the time/requires cueing 50 - 75% of the time   Pain No reports of pain   Therapy/Group: Individual Therapy  Cheryl Gibbs 06/05/2016, 4:17 PM

## 2016-06-05 NOTE — Progress Notes (Signed)
Occupational Therapy Note  Patient Details  Name: Ned ClinesLaura Filyaw MRN: [id number] Date of Birth: 01/11/1991  Today's Date: 06/05/2016 OT Individual Time: 4098-11911330-1425 OT Individual Time Calculation (min): 55 min    Pt c/o increased pain in LUE and LLE with movement and tactile input; repositioned Individual Therapy  Pt resting in bed upon arrival.  Focus on bed mobility and sitting balance without UE support.  Pt required mod A for supine>sit EOB with HOB elevated.  Pt hesitant to sit without UE support but was able to sit for 15 seconds at close supervision level (HOB on R). Pt transitioned to sitting EOB (HOB on L) and was able to sit EOB at close supervision level for 10 seconds without UE support.  Pt returned to supine with HOB elevated and remained in bed with all needs within reach, [facility], and bed alarm activated.   Lavone NeriLanier, Mukhtar Shams [facility] 06/05/2016, 2:33 PM

## 2016-06-05 NOTE — Progress Notes (Signed)
Physical Therapy Note  Patient Details  Name: Cheryl ClinesLaura Gibbs MRN: [id number] Date of Birth: 11/28/1990 Today's Date: 06/05/2016    Time: 730-830 60 minutes  1:1 No c/o pain, continued hypersensitivity to touch in Lt UE and LE.  Squat pivot transfers with +2 assist, pt able to contribute 25%.  mod A supine to sit, continues to be min A for sitting balance.  SIt to stand and standing balance in stedy with reaching with pt improving trunk control, still with decreased Lt LE hip and knee strength, improving balance reactions (still delayed). Pt then with lability, crying due to not being able to care for her baby and be at home with her kids. Pt with good communication with white board, PT offered emotional encouragement.   DONAWERTH,KAREN 06/05/2016, 8:30 AM

## 2016-06-05 NOTE — Progress Notes (Signed)
[facility]ICINE [facility] RE[facility]ABILITATION     PROGRESS NOTE    Subjective/Complaints: Another good nights. In good spirits today. Pain seems better LUE and LLE  ROS limited by language  Objective: Vital Signs: Blood pressure (!) 114/58, pulse (!) 58, temperature 98 F (36.7 C), temperature source Oral, resp. rate 18, height 5\' 4"  (1.626 m), weight 87.8 kg (193 lb 8 oz), last menstrual period 05/01/2016, SpO2 97 %. No results found.  Recent Labs  06/03/16 0925  WBC 6.8  HGB 13.0  HCT 42.1  PLT 351    Recent Labs  06/03/16 0925  NA 141  K 3.8  CL 106  GLUCOSE 100*  BUN 11  CREATININE 0.72  CALCIUM 9.7   CBG (last 3)   Recent Labs  06/04/16 1704 06/04/16 2320 06/05/16 0526  GLUCAP 91 149* 95    Wt Readings from Last 3 Encounters:  05/27/16 87.8 kg (193 lb 8 oz)  05/20/16 84.4 kg (186 lb 1.6 oz)    Physical Exam:  Constitutional: She appears well-developed and well-nourished.  HENT:  Mouth/Throat: Oropharynx is clear and moist.  Right crani incision/site stable Eyes: PERRL    Neck: Neck supple.  Trach stoma closed    Cardiovascular: RRR   Respiratory: CTA  GI: Soft. Bowel sounds are normal. She exhibits no distension. There is no tenderness.  PEG clean.  Musculoskeletal:  Decreased tenderness in left arm/leg Neurological: verbalizing to me in response to questions with one word answers. Making increased eye contact.  Spastic left hemiparesis trace tone LUE and 1/4 LLE. Has less discomfort/hypersensitivity with ROM of LUE/LLE  Moves RUE/RLE freely  Skin: Skin is warm and dry.  Psychiatric: much more engaging today. smiling  Assessment/Plan: 1. 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 Physiatrist is providing close team supervision and 24 hour management of active medical problems listed below. Physiatrist and  rehab team continue to assess barriers to discharge/monitor patient progress toward functional and medical goals.  Function:  Bathing Bathing position   Position: Other (comment) (on open seat tub bench)  Bathing parts Body parts bathed by patient: Chest, Abdomen, Front perineal area, Right upper leg, Left upper leg Body parts bathed by helper: Right arm, Left arm, Buttocks, Right lower leg, Left lower leg, Back  Bathing assist Assist Level: 2 helpers      Upper Body Dressing/Undressing Upper body dressing   What is the patient wearing?: Button up shirt     Pull over shirt/dress - Perfomed by patient: Thread/unthread right sleeve Pull over shirt/dress - Perfomed by helper: Thread/unthread left sleeve, Put head through opening, Pull shirt over trunk Button up shirt - Perfomed by patient: Thread/unthread right sleeve Button up shirt - Perfomed by helper: Thread/unthread left sleeve, Pull shirt around back, Button/unbutton shirt    Upper body assist Assist Level:  (max a)      Lower Body Dressing/Undressing Lower body dressing   What is the patient wearing?: Pants, Shoes, Socks, Non-skid slipper socks     Pants- Performed by patient: Pull pants up/down Pants- Performed by helper: Thread/unthread right pants leg, Thread/unthread left pants leg, Pull pants up/down   Non-skid slipper socks- Performed by helper: Don/doff left sock   Socks - Performed by helper: Don/doff right sock   Shoes - Performed by helper: Don/doff right shoe, Don/doff left shoe, Fasten right, Fasten left   AFO - Performed by  helper: Don/doff left AFO      Lower body assist Assist for lower body dressing: 2 Helpers      Toileting Toileting Toileting activity did not occur: Safety/medical concerns   Toileting steps completed by helper: Adjust clothing prior to toileting, Performs perineal hygiene, Adjust clothing after toileting Toileting Assistive Devices: Other (comment) (bariatric stedy)  Toileting  assist Assist level: Two helpers   Transfers Chair/bed transfer   Chair/bed transfer method: Squat pivot Chair/bed transfer assist level: Maximal assist (Pt 25 - 49%/lift and lower) Chair/bed transfer assistive device: Sliding board Mechanical lift: Stedy   Locomotion Ambulation Ambulation activity did not occur: Safety/medical concerns   Max distance: 2 ft Assist level: 2 helpers (max assist & +2 for safety)   Wheelchair   Type: Manual Max wheelchair distance: 150 ft Assist Level: Moderate assistance (Pt 50 - 74%)  Cognition Comprehension Comprehension assist level: Understands basic 90% of the time/cues < 10% of the time  Expression Expression assist level: Expresses basic 75 - 89% of the time/requires cueing 10 - 24% of the time. Needs helper to occlude trach/needs to repeat words.  Social Interaction Social Interaction assist level: Interacts appropriately 50 - 74% of the time - May be physically or verbally inappropriate.  Problem Solving Problem solving assist level: Solves basic 25 - 49% of the time - needs direction more than half the time to initiate, plan or complete simple activities  Memory Memory assist level: Recognizes or recalls 25 - 49% of the time/requires cueing 50 - 75% of the time     Medical Problem List and Plan: 1.  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  -cont CIR PT/OT/SLP therapies--increased engagement this week  -continue  helmet when OOB--  -Dr. Lionel December, [facility]---cranioplasty in a few weeks--would like to synch up with rehab discharge. .  2.  DVT Prophylaxis/Anticoagulation: Mechanical: Sequential compression devices, below knee Bilateral lower extremities. LLE dopplers 12/26 negative for DVT 3. Headaches/Pain Management: appears controlled  -increased gabapentin to 400mg  TID for headaches and dysesthesias     -continue hydrocodone at 10mg  prn  -behavioral component to pain.     4. Mood: LCSW to follow for evaluation and support when appropriate.   -depression has been an issue  -increased zoloft to 100mg  daily 5. Neuropsych: This patient is not capable of making decisions on her own behalf. 6. Skin/Wound Care: routine pressure relief measures.  7. Fluids/Electrolytes/Nutrition:   D1/nectars per SLP  -stopped TF. Eating around 50+%.   -reduced water flushes    8. VDRF:  trach stoma closed 9. New onset seizures: Continue Keppra bid.  10. Spasticity/Neuropathy LUE/LLE: see above  -continue  baclofen at 10mg  TID--titrate further as tolerated  -LLE ROM, splinting    .  LOS (Days) 16 A FACE TO FACE EVALUATION WAS PERFORMED  Ranelle Oyster, MD 06/05/2016 10:07 AM

## 2016-06-05 NOTE — Progress Notes (Signed)
Occupational Therapy Session Note  Patient Details  Name: Cheryl Gibbs MRN: [id number] Date of Birth: 1990/08/13  Today's Date: 06/05/2016 OT Individual Time: 3837-7939 OT Individual Time Calculation (min): 60 min     Short Term Goals: Week 2:  OT Short Term Goal 1 (Week 2): Pt will be able to sit at EOB for 5 minutes with mod A to maintain balance OT Short Term Goal 2 (Week 2): Pt will don a shirt with mod A. OT Short Term Goal 3 (Week 2): Pt will use R hand for self ROM of L fingers. OT Short Term Goal 4 (Week 2): Pt will transfer onto a BSC with slide board with max A of 1 person.  OT Short Term Goal 5 (Week 2): Pt with sit to stand with mod A of 1 during toileting.  Skilled Therapeutic Interventions/Progress Updates:    Pt received in w/c from her PT session. PT informed this therapist that pt was very emotional writing on her board about missing her children, her husband, and that she wants to go home now. Offered to pt to go outside as she has not been outside since admission. Pt very excited. Pt taken outside and she wrote thank you. Spent quite a bit of time communicating with her board about the topics mentioned above including concerns about driving again and returning to work. She was worried about a sitter taking care of the 4 kids.  Encouraged pt to focus on small goals to enable her to get home soon.   Pt demonstrated severe L visual field cut as she only writes on R side of board. Worked on visual scanning activity where she had to fill in bubbles across the whole board for 8 rows. She did need cues for each row to find the black line at the edge of the board to find the first bubble.  Then she could continue each row.  Pt also wrote about how much she enjoys being outside and love to fish.  Pt in much better spirits and agreeable to working on standing. Pt taken back to gym and worked on sit to stand at side of parallel bars 10x for 30 seconds each. She did extremely well pulling  herself up with close contact guard. Min A to maintain balance to support LLE knee extension and min A to sit down in controlled manner. Pt tolerated activity well.  Adjusted back in w/c with quick release belt and taken back to room with all needs met.    Therapy Documentation Precautions:  Precautions Precautions: Fall Precaution Comments: helmet when OOB Restrictions Weight Bearing Restrictions: No  Pain: no c/o pain   ADL: ADL ADL Comments: refer to navigator  See Function Navigator for Current Functional Status.   Therapy/Group: Individual Therapy  Phenix Grein 06/05/2016, 10:00 AM

## 2016-06-06 DIAGNOSIS — R001 Bradycardia, unspecified: Secondary | ICD-10-CM

## 2016-06-06 DIAGNOSIS — I959 Hypotension, unspecified: Secondary | ICD-10-CM

## 2016-06-06 LAB — GLUCOSE, CAPILLARY
GLUCOSE-CAPILLARY: 96 mg/dL (ref 65–99)
GLUCOSE-CAPILLARY: 99 mg/dL (ref 65–99)
Glucose-Capillary: 138 mg/dL — ABNORMAL HIGH (ref 65–99)
Glucose-Capillary: 86 mg/dL (ref 65–99)
Glucose-Capillary: 97 mg/dL (ref 65–99)

## 2016-06-06 NOTE — Plan of Care (Signed)
Problem: RH PAIN MANAGEMENT Goal: RH STG PAIN MANAGED AT OR BELOW PT'S PAIN GOAL Less than 3 out of 10   Outcome: Not Progressing 7 out of 10 constant pain to LUE

## 2016-06-06 NOTE — Progress Notes (Signed)
[place] PHYSICAL MEDICINE & REHABILITATION     PROGRESS NOTE    Subjective/Complaints: Pt seen laying in bed this AM.  She writes that she slept well and that she is moving her RLE.    ROS: Denies CP, SOB, N/V/D.  Objective: Vital Signs: Blood pressure (!) 97/54, pulse (!) 55, temperature 98.7 F (37.1 C), temperature source Tympanic, resp. rate 18, height 5\' 4"  (1.626 m), weight 87.8 kg (193 lb 8 oz), last menstrual period 05/01/2016, SpO2 94 %. No results found. No results for input(s): WBC, HGB, HCT, PLT in the last 72 hours. No results for input(s): NA, K, CL, GLUCOSE, BUN, CREATININE, CALCIUM in the last 72 hours.  Invalid input(s): CO CBG (last 3)   Recent Labs  06/06/16 0014 06/06/16 0610 06/06/16 1133  GLUCAP 97 96 138*    Wt Readings from Last 3 Encounters:  05/27/16 87.8 kg (193 lb 8 oz)  05/20/16 84.4 kg (186 lb 1.6 oz)    Physical Exam:  Constitutional: She appears well-developed and well-nourished.  HENT: Right crani incision/site stable Eyes: EOMI. No discharge.   Neck: Trach stoma closed    Cardiovascular: Regular rhythm. Bradycardia. No JVD. Respiratory: CTA b/l. Unlabored.  GI: Soft. Bowel sounds are normal. PEG clean.  Musculoskeletal: No edema. No tenderness Neurological:  Makes increased eye contact. Spastic left hemiparesis trace tone LUE and 1/4 LLE.  Moves RUE/RLE freely (unchanged) Skin: Skin is warm and dry.  Psychiatric: Normal behavior and mood.   Assessment/Plan: 1. 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 Physiatrist is providing close team supervision and 24 hour management of active medical problems listed below. Physiatrist and rehab team continue to assess barriers to discharge/monitor patient progress toward functional and medical goals.  Function:  Bathing Bathing position   Position:  Other (comment) (on open seat tub bench)  Bathing parts Body parts bathed by patient: Chest, Abdomen, Front perineal area, Right upper leg, Left upper leg Body parts bathed by helper: Right arm, Left arm, Buttocks, Right lower leg, Left lower leg, Back  Bathing assist Assist Level: 2 helpers      Upper Body Dressing/Undressing Upper body dressing   What is the patient wearing?: Button up shirt     Pull over shirt/dress - Perfomed by patient: Thread/unthread right sleeve Pull over shirt/dress - Perfomed by helper: Thread/unthread left sleeve, Put head through opening, Pull shirt over trunk Button up shirt - Perfomed by patient: Thread/unthread right sleeve Button up shirt - Perfomed by helper: Thread/unthread left sleeve, Pull shirt around back, Button/unbutton shirt    Upper body assist Assist Level:  (max a)      Lower Body Dressing/Undressing Lower body dressing   What is the patient wearing?: Pants, Shoes, Socks, Non-skid slipper socks     Pants- Performed by patient: Pull pants up/down Pants- Performed by helper: Thread/unthread right pants leg, Thread/unthread left pants leg, Pull pants up/down   Non-skid slipper socks- Performed by helper: Don/doff left sock   Socks - Performed by helper: Don/doff right sock   Shoes - Performed by helper: Don/doff right shoe, Don/doff left shoe, Fasten right, Fasten left   AFO - Performed by helper: Don/doff left AFO      Lower body assist Assist for lower body dressing: 2 Helpers      Toileting Toileting Toileting activity did not occur: Safety/medical concerns   Toileting steps completed by helper:  Adjust clothing prior to toileting, Performs perineal hygiene, Adjust clothing after toileting Toileting Assistive Devices: Other (comment) (bariatric stedy)  Toileting assist Assist level: Two helpers   Transfers Chair/bed transfer   Chair/bed transfer method: Squat pivot Chair/bed transfer assist level: Maximal assist (Pt 25 -  49%/lift and lower) Chair/bed transfer assistive device: Sliding board Mechanical lift: Stedy   Locomotion Ambulation Ambulation activity did not occur: Safety/medical concerns   Max distance: 2 ft Assist level: 2 helpers (max assist & +2 for safety)   Wheelchair   Type: Manual Max wheelchair distance: 150 ft Assist Level: Moderate assistance (Pt 50 - 74%)  Cognition Comprehension Comprehension assist level: Understands basic 90% of the time/cues < 10% of the time  Expression Expression assist level: Expresses basic 75 - 89% of the time/requires cueing 10 - 24% of the time. Needs helper to occlude trach/needs to repeat words.  Social Interaction Social Interaction assist level: Interacts appropriately 50 - 74% of the time - May be physically or verbally inappropriate.  Problem Solving Problem solving assist level: Solves basic 25 - 49% of the time - needs direction more than half the time to initiate, plan or complete simple activities  Memory Memory assist level: Recognizes or recalls 25 - 49% of the time/requires cueing 50 - 75% of the time     Medical Problem List and Plan: 1.  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  -cont CIR  -continue  helmet when OOB  -Dr. Lionel December, [facility]---cranioplasty in a few weeks--would like to sync with rehab discharge.   2.  DVT Prophylaxis/Anticoagulation: Mechanical: Sequential compression devices, below knee Bilateral lower extremities. LLE dopplers 12/26 negative for DVT 3. Headaches/Pain Management: controlled at present  -increased gabapentin to 400mg  TID for headaches and dysesthesias     -continue hydrocodone at 10mg  prn  -behavioral component to pain. 4. Mood: LCSW to follow for evaluation and support when appropriate.   -depression has been an issue  -increased zoloft to 100mg  daily 5. Neuropsych: This patient is not capable of making decisions on her own behalf. 6.  Skin/Wound Care: routine pressure relief measures.  7. Fluids/Electrolytes/Nutrition:  D1/nectars per SLP  stopped TF.   reduced water flushes    8. VDRF:  trach stoma closed 9. New onset seizures: Continue Keppra bid.  10. Spasticity/Neuropathy LUE/LLE: see above  -continue  baclofen at 10mg  TID--titrate further as tolerated  -LLE ROM, splinting  11. Hypotension/bradycardia  Asymptomatic at present  Will cont to monitor .  LOS (Days) 17 A FACE TO FACE EVALUATION WAS PERFORMED  Braylinn Gulden Karis Juba, MD 06/06/2016 11:53 AM

## 2016-06-07 ENCOUNTER — Inpatient Hospital Stay (HOSPITAL_COMMUNITY): Payer: Managed Care, Other (non HMO) | Admitting: Physical Therapy

## 2016-06-07 LAB — GLUCOSE, CAPILLARY
GLUCOSE-CAPILLARY: 86 mg/dL (ref 65–99)
Glucose-Capillary: 105 mg/dL — ABNORMAL HIGH (ref 65–99)
Glucose-Capillary: 117 mg/dL — ABNORMAL HIGH (ref 65–99)
Glucose-Capillary: 99 mg/dL (ref 65–99)
Glucose-Capillary: 99 mg/dL (ref 65–99)

## 2016-06-07 MED ORDER — BACITRACIN ZINC 500 UNIT/GM EX OINT: 1.0000 "application " | TOPICAL_OINTMENT | CUTANEOUS | Status: DC | PRN

## 2016-06-07 MED ORDER — BACLOFEN 20 MG PO TABS
20.0000 mg | ORAL_TABLET | Freq: Three times a day (TID) | ORAL | Status: DC
  Administered 2016-06-07 – 2016-07-09 (×93): 20 mg via ORAL
  Filled 2016-06-07 (×101): qty 1

## 2016-06-07 MED ORDER — BACITRACIN ZINC 500 UNIT/GM EX OINT: 1.0000 "application " | TOPICAL_OINTMENT | Freq: Every day | CUTANEOUS | Status: DC

## 2016-06-07 NOTE — Progress Notes (Signed)
[facility] PHYSICAL MEDICINE & REHABILITATION     PROGRESS NOTE    Subjective/Complaints: Pt seen laying in bed this AM.  She slept well overnight.  She appears a little down today.     ROS: Denies CP, SOB, N/V/D.  Objective: Vital Signs: Blood pressure (!) 91/59, pulse (!) 55, temperature 98.8 F (37.1 C), temperature source Oral, resp. rate 18, height 5\' 4"  (1.626 m), weight 87.8 kg (193 lb 8 oz), last menstrual period 05/01/2016, SpO2 95 %. No results found. No results for input(s): WBC, HGB, HCT, PLT in the last 72 hours. No results for input(s): NA, K, CL, GLUCOSE, BUN, CREATININE, CALCIUM in the last 72 hours.  Invalid input(s): CO CBG (last 3)   Recent Labs  06/06/16 1644 06/07/16 0006 06/07/16 0614  GLUCAP 99 117* 99    Wt Readings from Last 3 Encounters:  05/27/16 87.8 kg (193 lb 8 oz)  05/20/16 84.4 kg (186 lb 1.6 oz)    Physical Exam:  Constitutional: She appears well-developed and well-nourished.  HENT: Right crani incision/site stable Eyes: EOMI. No discharge.   Neck: Trach stoma closed    Cardiovascular: Regular rhythm. Bradycardia. No JVD. Respiratory: CTA b/l. Unlabored.  GI: Soft. Bowel sounds are normal. PEG clean.  Musculoskeletal: No edema. No tenderness Neurological:  Makes increased eye contact. Spastic left hemiparesis trace tone LUE and 1/4 LLE.  Moves RUE/RLE freely (unchanged) Skin: Skin is warm and dry.  Psychiatric: Normal behavior and mood.   Assessment/Plan: 1. 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 Physiatrist is providing close team supervision and 24 hour management of active medical problems listed below. Physiatrist and rehab team continue to assess barriers to discharge/monitor patient progress toward functional and medical goals.  Function:  Bathing Bathing position   Position: Other  (comment) (on open seat tub bench)  Bathing parts Body parts bathed by patient: Chest, Abdomen, Front perineal area, Right upper leg, Left upper leg Body parts bathed by helper: Right arm, Left arm, Buttocks, Right lower leg, Left lower leg, Back  Bathing assist Assist Level: 2 helpers      Upper Body Dressing/Undressing Upper body dressing   What is the patient wearing?: Button up shirt     Pull over shirt/dress - Perfomed by patient: Thread/unthread right sleeve Pull over shirt/dress - Perfomed by helper: Thread/unthread left sleeve, Put head through opening, Pull shirt over trunk Button up shirt - Perfomed by patient: Thread/unthread right sleeve Button up shirt - Perfomed by helper: Thread/unthread left sleeve, Pull shirt around back, Button/unbutton shirt    Upper body assist Assist Level:  (max a)      Lower Body Dressing/Undressing Lower body dressing   What is the patient wearing?: Pants, Shoes, Socks, Non-skid slipper socks     Pants- Performed by patient: Pull pants up/down Pants- Performed by helper: Thread/unthread right pants leg, Thread/unthread left pants leg, Pull pants up/down   Non-skid slipper socks- Performed by helper: Don/doff left sock   Socks - Performed by helper: Don/doff right sock   Shoes - Performed by helper: Don/doff right shoe, Don/doff left shoe, Fasten right, Fasten left   AFO - Performed by helper: Don/doff left AFO      Lower body assist Assist for lower body dressing: 2 Helpers      Toileting Toileting Toileting activity did not occur: Safety/medical concerns   Toileting steps completed by helper: Adjust  clothing prior to toileting, Performs perineal hygiene, Adjust clothing after toileting Toileting Assistive Devices: Other (comment) (bariatric stedy)  Toileting assist Assist level: Two helpers   Transfers Chair/bed transfer   Chair/bed transfer method: Squat pivot Chair/bed transfer assist level: Maximal assist (Pt 25 - 49%/lift  and lower) Chair/bed transfer assistive device: Sliding board Mechanical lift: Stedy   Locomotion Ambulation Ambulation activity did not occur: Safety/medical concerns   Max distance: 2 ft Assist level: 2 helpers (max assist & +2 for safety)   Wheelchair   Type: Manual Max wheelchair distance: 150 ft Assist Level: Moderate assistance (Pt 50 - 74%)  Cognition Comprehension Comprehension assist level: Understands basic 90% of the time/cues < 10% of the time  Expression Expression assist level: Expresses basic 75 - 89% of the time/requires cueing 10 - 24% of the time. Needs helper to occlude trach/needs to repeat words.  Social Interaction Social Interaction assist level: Interacts appropriately 50 - 74% of the time - May be physically or verbally inappropriate.  Problem Solving Problem solving assist level: Solves basic 25 - 49% of the time - needs direction more than half the time to initiate, plan or complete simple activities  Memory Memory assist level: Recognizes or recalls 25 - 49% of the time/requires cueing 50 - 75% of the time     Medical Problem List and Plan: 1.  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  -cont CIR  -continue  helmet when OOB  -Dr. Lionel December, [facility]---cranioplasty in a few weeks--would like to sync with rehab discharge.   2.  DVT Prophylaxis/Anticoagulation: Mechanical: Sequential compression devices, below knee Bilateral lower extremities. LLE dopplers 12/26 negative for DVT 3. Headaches/Pain Management: controlled at present  -increased gabapentin to 400mg  TID for headaches and dysesthesias     -continue hydrocodone at 10mg  prn  -behavioral component to pain. 4. Mood: LCSW to follow for evaluation and support when appropriate.   -depression has been an issue  -increased zoloft to 100mg  daily 5. Neuropsych: This patient is not capable of making decisions on her own behalf. 6.  Skin/Wound Care: routine pressure relief measures.  7. Fluids/Electrolytes/Nutrition:  D1/nectars per SLP  stopped TF.   reduced water flushes    8. VDRF:  trach stoma closed 9. New onset seizures: Continue Keppra bid.  10. Spasticity/Neuropathy LUE/LLE: see above  -baclofen at 10mg  TID, increased to 20 TID on 1/14  -LLE ROM, splinting  11. Hypotension/bradycardia  Asymptomatic 1/14  Will cont to monitor .  LOS (Days) 18 A FACE TO FACE EVALUATION WAS PERFORMED  Cheryl Parkin Karis Juba, MD 06/07/2016 8:44 AM

## 2016-06-07 NOTE — Progress Notes (Signed)
Physical Therapy Note  Patient Details  Name: Cheryl ClinesLaura Conkel MRN: [id number] Date of Birth: 10/05/1990 Today's Date: 06/07/2016    Time: 800-845 45 minutes  1:1 Pt continues with c/o pain with touch and pressure on Lt UE and LE, relieves with rest.  Pt agreeable to participate with encouragement, c/o fatigue today.  Supine to sit with mod/max A, sitting balance EOB with min/mod A, cues for anterior wt shift.  Sit to stand in stedy x 3 with mod A to stand, pt unable to tolerate standing > 1 minute today due to c/o pain in Lt LE with wt bearing.  Attempt gait at railing in hallway, pt self limiting writing "I'm not ready for that yet". Pt able to stand 3 x 1 minute at railing with mod A and perform wt shifts with mod/max A to encourage wt bearing on Lt LE.  desensitazion performed for Lt UE and LE with brushing and rubbing.  Pt encouraged to sit up in chair x 1 hour, left in room with helmet and quick release belt, RN aware.   DONAWERTH,KAREN 06/07/2016, 8:43 AM

## 2016-06-08 ENCOUNTER — Inpatient Hospital Stay (HOSPITAL_COMMUNITY): Payer: Managed Care, Other (non HMO) | Admitting: Speech Pathology

## 2016-06-08 ENCOUNTER — Inpatient Hospital Stay (HOSPITAL_COMMUNITY): Payer: Managed Care, Other (non HMO) | Admitting: Occupational Therapy

## 2016-06-08 ENCOUNTER — Inpatient Hospital Stay (HOSPITAL_COMMUNITY): Payer: Managed Care, Other (non HMO) | Admitting: Physical Therapy

## 2016-06-08 LAB — GLUCOSE, CAPILLARY
GLUCOSE-CAPILLARY: 95 mg/dL (ref 65–99)
GLUCOSE-CAPILLARY: 87 mg/dL (ref 65–99)
Glucose-Capillary: 111 mg/dL — ABNORMAL HIGH (ref 65–99)
Glucose-Capillary: 113 mg/dL — ABNORMAL HIGH (ref 65–99)

## 2016-06-08 MED ORDER — HYDROCODONE-ACETAMINOPHEN 7.5-325 MG/15ML PO SOLN
15.0000 mL | Freq: Two times a day (BID) | ORAL | Status: DC
  Administered 2016-06-09 – 2016-06-23 (×30): 15 mL via ORAL
  Filled 2016-06-08 (×30): qty 15

## 2016-06-08 MED ORDER — GABAPENTIN 250 MG/5ML PO SOLN
500.0000 mg | Freq: Three times a day (TID) | ORAL | Status: DC
  Administered 2016-06-08: 400 mg via ORAL
  Administered 2016-06-09 – 2016-06-10 (×4): 500 mg via ORAL
  Filled 2016-06-08 (×7): qty 10

## 2016-06-08 NOTE — Progress Notes (Signed)
Speech Language Pathology Daily Session Note  Patient Details  Name: Cheryl ClinesLaura Gibbs MRN: [id number] Date of Birth: 02/13/1991  Today's Date: 06/08/2016 SLP Individual Time: 1030-1130 SLP Individual Time Calculation (min): 60 min   Short Term Goals: Week 3: SLP Short Term Goal 1 (Week 3): Patient will scan to left field of enviornment to locate specific objects, etc with Min A verbal and visual cues.  SLP Short Term Goal 2 (Week 3): Patient will demonstrate sustained attention to functional tasks for 10 minutes with Min A verbal cues.  SLP Short Term Goal 3 (Week 3): Patient will demonstrate basic problem solving with Min A verbal cues.  SLP Short Term Goal 4 (Week 3): Patient will self-monitor and correct erros during written expression with supervision multimodal cues.  SLP Short Term Goal 5 (Week 3): Patient will verbalize at the word level with 50% intelligibility and Max A multimodal cues.  SLP Short Term Goal 6 (Week 3): Patient will consume current diet with miniaml overt s/s of aspiration and supervision verbal cues for use of swallowing compensatory strategies   Skilled Therapeutic Interventions: Skilled treatment session focused on speech goals.  Patient independently named functional items with 100% accuracy but required Max A verbal cues to self-monitor and correct verbal errors such as omission of initial sound and sound substitutions. Patient also required Mod A verbal cues for intelligibility in regards to maximizing vocal intensity and over-articulating. Patient utilized her Public affairs consultantcommunication board for functional communication but required Max A multimodal cues for increasing information on board to maximize expression of wants/needs. Patient agreeable to trials of thin liquids, however, after completion of oral care, patient declined trials and requested to get back into bed. [facility]. Patient left supine in bed with all needs within  reach. Continue with current plan of care.   Function:  Eating Eating   Modified Consistency Diet: Yes Eating Assist Level: More than reasonable amount of time   Eating Set Up Assist For: Opening containers       Cognition Comprehension Comprehension assist level: Understands basic 75 - 89% of the time/ requires cueing 10 - 24% of the time  Expression Expression assistive device: Communication board Expression assist level: Expresses basic 75 - 89% of the time/requires cueing 10 - 24% of the time. Needs helper to occlude trach/needs to repeat words.  Social Interaction Social Interaction assist level: Interacts appropriately 50 - 74% of the time - May be physically or verbally inappropriate.  Problem Solving Problem solving assist level: Solves basic 25 - 49% of the time - needs direction more than half the time to initiate, plan or complete simple activities  Memory Memory assist level: Recognizes or recalls 50 - 74% of the time/requires cueing 25 - 49% of the time    Pain    Therapy/Group: Individual Therapy  [name] 06/08/2016, 3:32 PM

## 2016-06-08 NOTE — Progress Notes (Signed)
Occupational Therapy Weekly Progress Note  Patient Details  Name: Cheryl Gibbs MRN: [id number] Date of Birth: 1990/06/16  Beginning of progress report period: June 01, 2016 End of progress report period: June 08, 2016  Today's Date: 06/08/2016 OT Individual Time: 3846-6599 OT Individual Time Calculation (min): 75 min     Patient has met 2 of 5 short term goals.  Pt is making progress with her attention, initiation, trunk control, tolerance of movement, sit to stand.  Pt is now able to sit EOB for at least 10 min and can hold balance for a few seconds at supervision, mostly needs mod A and and when she leans back needs total A to recover.  Her sit to stand skills have also improved to mod A.   She also is tolerating washing her own arm.   She partly met transfer goal. She can now use board with max A of 1 but will not tolerate sitting on BSC as it (including Padded one) has been too uncomfortable for her.  She is progressing with self ROM and donning shirt.   Patient continues to demonstrate the following deficits: muscle weakness, decreased cardiorespiratoy endurance, abnormal tone, decreased visual perceptual skills and hemianopsia, decreased midline orientation and decreased attention to left, decreased problem solving and decreased memory and decreased sitting balance, decreased standing balance, decreased postural control and hemiplegia and therefore will continue to benefit from skilled OT intervention to enhance overall performance with BADL.  Patient progressing toward long term goals..  Continue plan of care.  OT Short Term Goals Week 2:  OT Short Term Goal 1 (Week 2): Pt will be able to sit at EOB for 5 minutes with mod A to maintain balance OT Short Term Goal 1 - Progress (Week 2): Met OT Short Term Goal 2 (Week 2): Pt will don a shirt with mod A. OT Short Term Goal 2 - Progress (Week 2): Progressing toward goal OT Short Term Goal 3 (Week 2): Pt will use R hand for self ROM of L  fingers. OT Short Term Goal 3 - Progress (Week 2): Progressing toward goal OT Short Term Goal 4 (Week 2): Pt will transfer onto a BSC with slide board with max A of 1 person.  OT Short Term Goal 4 - Progress (Week 2): Partly met OT Short Term Goal 5 (Week 2): Pt with sit to stand with mod A of 1 during toileting. OT Short Term Goal 5 - Progress (Week 2): Met Week 3:  OT Short Term Goal 1 (Week 3): Pt will be able to complete squat pivot from bed to chair to prepare for LB dressing with mod A of 1. OT Short Term Goal 2 (Week 3): Pt will be able to sit to stand at sink or EOB with min A to prepare for LB dressing.  OT Short Term Goal 3 (Week 3): From sitting EOB, pt will don RLE into pants and pull up to thigh level with min A. OT Short Term Goal 4 (Week 3): Pt will don pullover shirt with mod A.   Skilled Therapeutic Interventions/Progress Updates:    Pt received on bed pan, she wrote on board that she was not finished yet. Practiced verbalizing with this therapist "OK" to indicate when finished.  She also wrote that clean clothes were put on her this am and she was bathed yesterday.  Pt did vocalize OK. Bed pan and clean up during facilitation of pt with rolling in bed with max A and bridging. Continues  to have LLE pain with knee/hip flexion. Sat to EOB max A and worked on sitting for over 10 min to include static holds and reaching.  Slide board to w/c max A of 1 with 2nd person present for safety.  Pt c/o board pinching her leg. Slides were not utilized, pt facilitated through trunk to scoot on board.   Sit to stand from w/c to sink with cues for pt to push up through R arm and then reach for sink. 5x, held for 10-20 sec with mod A to hold balance. Pt adjusted in w/c with quick release belt, and all needs met.     Therapy Documentation Precautions:  Precautions Precautions: Fall Precaution Comments: helmet when OOB Restrictions Weight Bearing Restrictions: No  Vital Signs: Therapy  Vitals Temp: 97.9 F (36.6 C) Temp Source: Oral Pulse Rate: 63 Resp: 18 BP: 111/69 Patient Position (if appropriate): Lying Oxygen Therapy SpO2: 98 % O2 Device: Not Delivered Pain:  only c/o L hip/arm pain with mobility ADL: ADL ADL Comments: refer to navigator  See Function Navigator for Current Functional Status.   Therapy/Group: Individual Therapy  Whitley Patchen 06/08/2016, 8:48 AM

## 2016-06-08 NOTE — Progress Notes (Signed)
PT reports that dysesthesias have been a limiting factor yesterday and today. Discussed issues with spasticity, neuropathy as well as behavior affecting mobility. Will schedule hydrocodone prior to therapy. Increase Neurontin to 500 mg tid.

## 2016-06-08 NOTE — Plan of Care (Signed)
Problem: RH BOWEL ELIMINATION Goal: RH STG MANAGE BOWEL WITH ASSISTANCE STG Manage Bowel with max Assistance.   Outcome: Not Progressing LBM 06-05-16 Goal: RH STG MANAGE BOWEL W/MEDICATION W/ASSISTANCE STG Manage Bowel with Medication with max Assistance.   Outcome: Not Progressing LBM 06-05-16

## 2016-06-08 NOTE — Progress Notes (Signed)
[place] PHYSICAL MEDICINE & REHABILITATION     PROGRESS NOTE    Subjective/Complaints: Up in bed. Tech in room. No new issues. Asked her where she got her toboggan from and she said "husband". Doesn't like look or smell of food. Eating from 0-75% of meals  ROS limited by cognition  Objective: Vital Signs: Blood pressure 111/69, pulse 63, temperature 97.9 F (36.6 C), temperature source Oral, resp. rate 18, height 5\' 4"  (1.626 m), weight 87.8 kg (193 lb 8 oz), last menstrual period 05/01/2016, SpO2 98 %. No results found. No results for input(s): WBC, HGB, HCT, PLT in the last 72 hours. No results for input(s): NA, K, CL, GLUCOSE, BUN, CREATININE, CALCIUM in the last 72 hours.  Invalid input(s): CO CBG (last 3)   Recent Labs  06/07/16 1913 06/07/16 2356 06/08/16 0623  GLUCAP 105* 86 95    Wt Readings from Last 3 Encounters:  05/27/16 87.8 kg (193 lb 8 oz)  05/20/16 84.4 kg (186 lb 1.6 oz)    Physical Exam:  Constitutional: She appears well-developed and well-nourished.  HENT: Right crani incision/site stable Eyes: EOMI. No discharge.   Neck: Trach stoma closed    Cardiovascular: RRR Respiratory: CTA b/l. Unlabored.  GI: Soft. Bowel sounds are normal. PEG clean.  Musculoskeletal: No edema. No tenderness Neurological:  Makes increased eye contact and engages more. Verbalizes on  Occasion. Uses white board to write Spastic left hemiparesis trace tone LUE and tr to 1/4 LLE.  Moves RUE/RLE freely 5/5.  Skin: Skin is warm and dry.  Psychiatric: Normal behavior and mood.   Assessment/Plan: 1. 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 Physiatrist is providing close team supervision and 24 hour management of active medical problems listed below. Physiatrist and rehab team continue to assess barriers to discharge/monitor patient progress  toward functional and medical goals.  Function:  Bathing Bathing position   Position: Other (comment) (on open seat tub bench)  Bathing parts Body parts bathed by patient: Chest, Abdomen, Front perineal area, Right upper leg, Left upper leg Body parts bathed by helper: Right arm, Left arm, Buttocks, Right lower leg, Left lower leg, Back  Bathing assist Assist Level: 2 helpers      Upper Body Dressing/Undressing Upper body dressing   What is the patient wearing?: Button up shirt     Pull over shirt/dress - Perfomed by patient: Thread/unthread right sleeve Pull over shirt/dress - Perfomed by helper: Thread/unthread left sleeve, Put head through opening, Pull shirt over trunk Button up shirt - Perfomed by patient: Thread/unthread right sleeve Button up shirt - Perfomed by helper: Thread/unthread left sleeve, Pull shirt around back, Button/unbutton shirt    Upper body assist Assist Level:  (max a)      Lower Body Dressing/Undressing Lower body dressing   What is the patient wearing?: Pants, Shoes, Socks, Non-skid slipper socks     Pants- Performed by patient: Pull pants up/down Pants- Performed by helper: Thread/unthread right pants leg, Thread/unthread left pants leg, Pull pants up/down   Non-skid slipper socks- Performed by helper: Don/doff left sock   Socks - Performed by helper: Don/doff right sock   Shoes - Performed by helper: Don/doff right shoe, Don/doff left shoe, Fasten right, Fasten left   AFO - Performed by helper: Don/doff left AFO      Lower body assist Assist for lower body dressing: 2 Helpers  Toileting Toileting Toileting activity did not occur: Safety/medical concerns   Toileting steps completed by helper: Adjust clothing prior to toileting, Performs perineal hygiene, Adjust clothing after toileting Toileting Assistive Devices: Other (comment) (bariatric stedy)  Toileting assist Assist level: Two helpers   Transfers Chair/bed transfer    Chair/bed transfer method: Squat pivot Chair/bed transfer assist level: Maximal assist (Pt 25 - 49%/lift and lower) Chair/bed transfer assistive device: Sliding board Mechanical lift: Stedy   Locomotion Ambulation Ambulation activity did not occur: Safety/medical concerns   Max distance: 2 ft Assist level: 2 helpers (max assist & +2 for safety)   Wheelchair   Type: Manual Max wheelchair distance: 150 ft Assist Level: Moderate assistance (Pt 50 - 74%)  Cognition Comprehension Comprehension assist level: Understands basic 90% of the time/cues < 10% of the time  Expression Expression assist level: Expresses basic 75 - 89% of the time/requires cueing 10 - 24% of the time. Needs helper to occlude trach/needs to repeat words.  Social Interaction Social Interaction assist level: Interacts appropriately 50 - 74% of the time - May be physically or verbally inappropriate.  Problem Solving Problem solving assist level: Solves basic 25 - 49% of the time - needs direction more than half the time to initiate, plan or complete simple activities  Memory Memory assist level: Recognizes or recalls 25 - 49% of the time/requires cueing 50 - 75% of the time     Medical Problem List and Plan: 1.  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  -cont CIR--demonstrating cognitive/functional gains  -continue  helmet when OOB  -Dr. Lionel December, [facility]---cranioplasty in a few weeks--would like to sync with rehab discharge.   2.  DVT Prophylaxis/Anticoagulation: Mechanical: Sequential compression devices, below knee Bilateral lower extremities. LLE dopplers 12/26 negative for DVT 3. Headaches/Pain Management: controlled at present  -continue gabapentin at 400mg  TID for headaches and dysesthesias     -continue hydrocodone at 10mg  prn  -behavioral component to pain. 4. Mood: LCSW to follow for evaluation and support when appropriate.   -depression  has been an issue  -maintain zoloft at 100mg  daily 5. Neuropsych: This patient is not capable of making decisions on her own behalf. 6. Skin/Wound Care: routine pressure relief measures.  7. Fluids/Electrolytes/Nutrition:  D1/nectars per SLP  -reiterated to [place] that she needs to work through current diet/consistencies before we can advance  -check labs tomorrow 8. VDRF:  trach stoma closed 9. New onset seizures: Continue Keppra bid.  10. Spasticity/Neuropathy LUE/LLE: see above  -baclofen at 10mg  TID, increased to 20 TID on 1/14--tolerating thus far--appears effective  -LLE ROM, splinting  11. Hypotension/bradycardia  Asymptomatic 1/14  Will cont to monitor .  LOS (Days) 19 A FACE TO FACE EVALUATION WAS PERFORMED  Ranelle Oyster, MD 06/08/2016 9:22 AM

## 2016-06-08 NOTE — Progress Notes (Signed)
Physical Therapy Session Note  Patient Details  Name: Cheryl Gibbs MRN: [id number] Date of Birth: 02/26/1991  Today's Date: 06/08/2016 PT Individual Time: 1359-1444 PT Individual Time Calculation (min): 45 min    Short Term Goals: Week 2:  PT Short Term Goal 1 (Week 2): Pt will consistently demonstrate sitting balance with Mod assist. PT Short Term Goal 2 (Week 2): Pt will consistently perform functional transfers (bed<>w/c) with max assist +1. PT Short Term Goal 3 (Week 2): Pt will propel manual w/c 40 ft with supervision.  Skilled Therapeutic Interventions/Progress Updates:    Pt received in bed reporting need to use bedpan, encouraged pt to utilize toilet & pt agreeable. Pt required mod assist to transfer supine>sitting EOB with cuing for technique, assistance for weight shifting & use of bed rails & HOB elevated. Pt with sensitivity to touch in LUE/LE. Pt able to complete sit<>stand with mod assist from bed to stedy lift. Transported pt bed>toilet with Stedy via +2 (pt requires mod assist for sitting balance in lift). Pt sat on cushioned cut-out tub transfer bench for increased comfort; pt began crying in pain while sitting but not due for pain meds yet & did not want to transfer off of elevated toilet seat yet. After attempting to use toilet pt reported pain in L hip was hindering her ability to do so & requested the bedpan. Assisted pt back to bed where she had incontinent void. Pt still assisted onto bed pan for BM. Pt required extended time on bedpan and missed 15 minutes of skilled PT treatment 2/2 toileting; RN notified pt on bedpan.   Pt able to verbalize "hey" and "no" on this date and attempted to communicate with dry erase board but would consistently use only one word or not completely spell out words. Pt would also nod or shake head yes or no inconsistently during session; pt becoming frustrated with therapist due to poor communication.  Therapy Documentation Precautions:   Precautions Precautions: Fall Precaution Comments: helmet when OOB Restrictions Weight Bearing Restrictions: No   General: PT Amount of Missed Time (min): 15 Minutes PT Missed Treatment Reason: Other (Comment) (toileting)   See Function Navigator for Current Functional Status.   Therapy/Group: Individual Therapy  Sandi MariscalVictoria M Jannine Abreu 06/08/2016, 4:45 PM

## 2016-06-09 ENCOUNTER — Inpatient Hospital Stay (HOSPITAL_COMMUNITY): Payer: Managed Care, Other (non HMO) | Admitting: Physical Therapy

## 2016-06-09 ENCOUNTER — Inpatient Hospital Stay (HOSPITAL_COMMUNITY): Payer: Managed Care, Other (non HMO) | Admitting: Speech Pathology

## 2016-06-09 ENCOUNTER — Inpatient Hospital Stay (HOSPITAL_COMMUNITY): Payer: Managed Care, Other (non HMO) | Admitting: Occupational Therapy

## 2016-06-09 LAB — CBC
HCT: 44.7 % (ref 36.0–46.0)
Hemoglobin: 14.4 g/dL (ref 12.0–15.0)
MCH: 28.5 pg (ref 26.0–34.0)
MCHC: 32.2 g/dL (ref 30.0–36.0)
MCV: 88.5 fL (ref 78.0–100.0)
Platelets: 338 K/uL (ref 150–400)
RBC: 5.05 MIL/uL (ref 3.87–5.11)
RDW: 15.1 % (ref 11.5–15.5)
WBC: 13.9 K/uL — ABNORMAL HIGH (ref 4.0–10.5)

## 2016-06-09 LAB — BASIC METABOLIC PANEL WITH GFR
Anion gap: 14 (ref 5–15)
BUN: 9 mg/dL (ref 6–20)
CO2: 23 mmol/L (ref 22–32)
Calcium: 9.9 mg/dL (ref 8.9–10.3)
Chloride: 103 mmol/L (ref 101–111)
Creatinine, Ser: 0.69 mg/dL (ref 0.44–1.00)
GFR calc Af Amer: >60 mL/min
GFR calc non Af Amer: >60 mL/min
Glucose, Bld: 95 mg/dL (ref 65–99)
Potassium: 4.2 mmol/L (ref 3.5–5.1)
Sodium: 140 mmol/L (ref 135–145)

## 2016-06-09 LAB — GLUCOSE, CAPILLARY
Glucose-Capillary: 127 mg/dL — ABNORMAL HIGH (ref 65–99)
Glucose-Capillary: 89 mg/dL (ref 65–99)
Glucose-Capillary: 109 mg/dL — ABNORMAL HIGH (ref 65–99)

## 2016-06-09 MED ORDER — METHYLPHENIDATE HCL 5 MG PO TABS
5.0000 mg | ORAL_TABLET | Freq: Two times a day (BID) | ORAL | Status: AC
  Administered 2016-06-09 – 2016-06-10 (×3): 5 mg via ORAL
  Filled 2016-06-09 (×3): qty 1

## 2016-06-09 NOTE — Progress Notes (Signed)
RN educated mother on D1, nectar thick diet. Mother stated she brought a chopped up hotdog for the patient for dinner. RN educated on purpose of pureed diet and why it is important.  Pt can silently aspirate and the signs and symptoms will not been seen immediatly.  RN advised mother to let staff see the consistency of the food prior to eating. RN left the room.  NT discussed dinner and diet with mother.  Mother wanted to feed her something from home and NT asked what was she going to feed her. Mother stated, "I know that little nurse is not happy with me, I am going to wait until she leaves."  NT stated to mother the importance of following her diet and did not allow the pt to eat food from home at this time. RN and NT will pass along information to night staff to continue to monitor and educate.

## 2016-06-09 NOTE — Progress Notes (Signed)
Physical Therapy Weekly Progress Note  Patient Details  Name: Cheryl Gibbs MRN: [id number] Date of Birth: 05-03-1991  Beginning of progress report period: June 02, 2016 End of progress report period: June 09, 2016  Patient has met 2 of 3 short term goals.  Pt continues to make slow progress towards goals. Improving sitting balance and slight improvements with communication. Pt continues to require 2 person assist for functional transfers, decreased motivation to participate in standing and walking tasks, requires encouragement to participate, self limiting due to Lt UE and LE pain.  Patient continues to demonstrate the following deficits muscle weakness, abnormal tone, unbalanced muscle activation, decreased coordination and decreased motor planning, decreased initiation, decreased attention, decreased awareness, decreased problem solving, decreased memory and delayed processing and decreased sitting balance, decreased standing balance, decreased postural control, hemiplegia and decreased balance strategies and therefore will continue to benefit from skilled PT intervention to increase functional independence with mobility.  Patient progressing toward long term goals..  Continue plan of care.  PT Short Term Goals Week 2:  PT Short Term Goal 1 (Week 2): Pt will consistently demonstrate sitting balance with Mod assist. PT Short Term Goal 1 - Progress (Week 2): Met PT Short Term Goal 2 (Week 2): Pt will consistently perform functional transfers (bed<>w/c) with max assist +1. PT Short Term Goal 2 - Progress (Week 2): Progressing toward goal PT Short Term Goal 3 (Week 2): Pt will propel manual w/c 40 ft with supervision. PT Short Term Goal 3 - Progress (Week 2): Progressing toward goal Week 3:  PT Short Term Goal 1 (Week 3): Pt will consistently perform functional transfers with +1 max A PT Short Term Goal 2 (Week 3): Pt will propel manual w/c x 25' with min A PT Short Term Goal 3 (Week 3): Pt  will maintain sitting balance for functional task with min A  Skilled Therapeutic Interventions/Progress Updates:  Ambulation/gait training;Cognitive remediation/compensation;Discharge planning;DME/adaptive equipment instruction;Functional mobility training;Pain management;Splinting/orthotics;Therapeutic Activities;UE/LE Strength taining/ROM;Wheelchair propulsion/positioning;UE/LE Coordination activities;Therapeutic Exercise;Stair training;Patient/family education;Neuromuscular re-education;Functional electrical stimulation;Community reintegration;Balance/vestibular training     See Function Navigator for Current Functional Status.   Jaydon Soroka 06/09/2016, 7:29 AM

## 2016-06-09 NOTE — Progress Notes (Signed)
RN stating that patients mother brought in a wendys frosty, french fries, and chili last night for dinner. Mother fed patient the wendys.  RN stated mother has been educated on D1 nectar diet and possible complications of not following diet. RN will provide education to mother when arrival this afternoon.

## 2016-06-09 NOTE — Progress Notes (Signed)
Physical Therapy Note  Patient Details  Name: Cheryl ClinesLaura Barrell MRN: [id number] Date of Birth: 08/18/1990 Today's Date: 06/09/2016    Time: 730-824 54 minutes  1:1 Pt continues with c/o pain with any movement or touch of Lt UE and LE. Pain limiting progress in therapy.  Pain meds given during session.  Pt mod A with rolling to don pants, mod A for supine to sit at EOB. Pt continues to require min/mod A for sitting balance EOB, only able to perform seated balance with supervision 2-3 seconds before LOB with pt unable to self correct.  Transfer to manual w/c with stedy with mod A to stand. Pt able to propel w/c with min A using hemi technique in controlled environment x 50' with cues for technique and encouragement. Sliding board transfer w/c <> mat with mod A +2, pt able to reach but limited by Lt UE and LE pain during transfers.  Seated balance on mat with reaching task with pt continuing to require min/mod A. Pt continues with lability during session, decreased motivation to participate.  Time: 1045-1113 28 minutes  1:1 Pt crying with c/o back pain upon PT arrival. Pt repositioned in chair but still unable to tolerate. Pt transferred to bed with stedy with max A to stand, +2 assist for sit to supine and positioning in bed.  White board communication with increased time and gesturing cues to communicate need to call mother in law. Pt with difficulty using cell phone but refuses PT's offer of assistance.  Desensitization with gentle PROM to Lt toes and metatarsals with pt able to tolerate, attempt PROM of Lt ankle PF/DF and pt unable to tolerate due to pain.  Attempt PROM Lt thumb and fingers, pt unable to tolerate. Pt able to tolerate gentle PROM Lt supination/pronation and shoulder IR/ER.  Pain limiting pt's performance this session.  DONAWERTH,KAREN 06/09/2016, 8:26 AM

## 2016-06-09 NOTE — Progress Notes (Signed)
Pt has minimal PO intake. D1 Nectar diet with refusal of meals. Pt eats yogurt and applesauce at times with very low liquid intake. PRN Osmolite to be given if pt eats less than 50% of meals with pt refusing tube feeds.

## 2016-06-09 NOTE — Progress Notes (Signed)
Occupational Therapy Session Note  Patient Details  Name: Cheryl Gibbs MRN: [id number] Date of Birth: [birth year]  Today's Date: 06/09/2016 OT Individual Time: 4401-0272 OT Individual Time Calculation (min): 75 min     Short Term Goals: Week 2:  OT Short Term Goal 1 (Week 2): Pt will be able to sit at EOB for 5 minutes with mod A to maintain balance OT Short Term Goal 1 - Progress (Week 2): Met OT Short Term Goal 2 (Week 2): Pt will don a shirt with mod A. OT Short Term Goal 2 - Progress (Week 2): Progressing toward goal OT Short Term Goal 3 (Week 2): Pt will use R hand for self ROM of L fingers. OT Short Term Goal 3 - Progress (Week 2): Progressing toward goal OT Short Term Goal 4 (Week 2): Pt will transfer onto a BSC with slide board with max A of 1 person.  OT Short Term Goal 4 - Progress (Week 2): Partly met OT Short Term Goal 5 (Week 2): Pt with sit to stand with mod A of 1 during toileting. OT Short Term Goal 5 - Progress (Week 2): Met Week 3:  OT Short Term Goal 1 (Week 3): Pt will be able to complete squat pivot from bed to chair to prepare for LB dressing with mod A of 1. OT Short Term Goal 2 (Week 3): Pt will be able to sit to stand at sink or EOB with min A to prepare for LB dressing.  OT Short Term Goal 3 (Week 3): From sitting EOB, pt will don RLE into pants and pull up to thigh level with min A. OT Short Term Goal 4 (Week 3): Pt will don pullover shirt with mod A.  Skilled Therapeutic Interventions/Progress Updates:    Pt seen for ADL retraining with a focus on activity tolerance, sit to stand, postural control. Pt received in standard w/c that she had been using with PT for self propulsion. Pt taken to closet to choose clothing for the day. She was attempting to verbalize a little more today but mostly used white board to communicate. Pt placed at sink to engage in self care but she was leaning to the L severely and not responding well to cues to self correct. Max A to  reposition in midline.  Worked on sit to stand at sink for LB self care. Pt initially was able to 3x but with +2 A. She was c/o pain in LUE/LLE with standing. By the 4th time, needed to use the stedy lift to stand her and transfer her to her tilt and space w/c. Pt had very limited standing tolerance at sink, and cried in pain in the stedy from pressure on her feet.   Pt positioned in w/c with all needs met. Call bell in reach.   Therapy Documentation Precautions:  Precautions Precautions: Fall Precaution Comments: helmet when OOB Restrictions Weight Bearing Restrictions: No  Pain: Pain Assessment Pain Assessment: Faces Faces Pain Scale: Hurts whole lot Pain Type: Acute pain Pain Location: Arm (arm and leg) Pain Orientation: Left Pain Descriptors / Indicators: Aching Pain Onset: With Activity Pain Intervention(s): RN made aware ADL: ADL ADL Comments: refer to navigator  See Function Navigator for Current Functional Status.   Therapy/Group: Individual Therapy  Teara Duerksen 06/09/2016, 10:16 AM

## 2016-06-09 NOTE — Progress Notes (Signed)
Speech Language Pathology Daily Session Note  Patient Details  Name: Cheryl Gibbs MRN: [id number] Date of Birth: 05/12/1991  Today's Date: 06/09/2016 SLP Individual Time: [id number] SLP Individual Time Calculation (min): 25 min   Short Term Goals: Week 3: SLP Short Term Goal 1 (Week 3): Patient will scan to left field of enviornment to locate specific objects, etc with Min A verbal and visual cues.  SLP Short Term Goal 2 (Week 3): Patient will demonstrate sustained attention to functional tasks for 10 minutes with Min A verbal cues.  SLP Short Term Goal 3 (Week 3): Patient will demonstrate basic problem solving with Min A verbal cues.  SLP Short Term Goal 4 (Week 3): Patient will self-monitor and correct erros during written expression with supervision multimodal cues.  SLP Short Term Goal 5 (Week 3): Patient will verbalize at the word level with 50% intelligibility and Max A multimodal cues.  SLP Short Term Goal 6 (Week 3): Patient will consume current diet with miniaml overt s/s of aspiration and supervision verbal cues for use of swallowing compensatory strategies   Skilled Therapeutic Interventions: Skilled treatment session focused on functional communication and dysphagia goals. SLP facilitated session by providing Mod A verbal and question cues for patient to self-monitor and correct errors during written expression and for elaboration in order for patient to express her wants/needs. Patient also with limited verbalizations today despite Max encouragement. Patient consumed trials of Dys. 2 textures without overt s/s of aspiration but with minimal mastication and oral manipulation despite Max A multimodal cues. Recommend continued trials with SLP only. Patient left supine in bed with all needs within reach. Continue with current plan of care.   Function:  Eating Eating   Modified Consistency Diet: Yes Eating Assist Level: More than reasonable amount of time;Supervision or verbal cues    Eating Set Up Assist For: Opening containers       Cognition Comprehension Comprehension assist level: Understands basic 75 - 89% of the time/ requires cueing 10 - 24% of the time  Expression Expression assistive device: Communication board Expression assist level: Expresses basic 75 - 89% of the time/requires cueing 10 - 24% of the time. Needs helper to occlude trach/needs to repeat words.  Social Interaction Social Interaction assist level: Interacts appropriately 50 - 74% of the time - May be physically or verbally inappropriate.  Problem Solving Problem solving assist level: Solves basic 25 - 49% of the time - needs direction more than half the time to initiate, plan or complete simple activities  Memory Memory assist level: Recognizes or recalls 50 - 74% of the time/requires cueing 25 - 49% of the time    Pain Pain Assessment Faces Pain Scale: Hurts even more Pain Location: Arm (and leg) Pain Intervention(s): Medication (See eMAR)  Therapy/Group: Individual Therapy  Jasim Harari 06/09/2016, 3:13 PM

## 2016-06-09 NOTE — Progress Notes (Signed)
[facility] PHYSICAL MEDICINE & REHABILITATION     PROGRESS NOTE    Subjective/Complaints: More pain in left arm, leg (seems to come and go depending upon moment/day)  ROS: Unable to obtain due to cognitive/mental status issues.  Objective: Vital Signs: Blood pressure (!) 128/98, pulse 64, temperature 98.7 F (37.1 C), temperature source Oral, resp. rate 18, height 5\' 4"  (1.626 m), weight 87.8 kg (193 lb 8 oz), last menstrual period 05/01/2016, SpO2 100 %. No results found.  Recent Labs  06/09/16 0539  WBC 13.9*  HGB 14.4  HCT 44.7  PLT 338    Recent Labs  06/09/16 0539  NA 140  K 4.2  CL 103  GLUCOSE 95  BUN 9  CREATININE 0.69  CALCIUM 9.9   CBG (last 3)   Recent Labs  06/08/16 1708 06/08/16 2339 06/09/16 0530  GLUCAP 113* 111* 127*    Wt Readings from Last 3 Encounters:  05/27/16 87.8 kg (193 lb 8 oz)  05/20/16 84.4 kg (186 lb 1.6 oz)    Physical Exam:  Constitutional: She appears well-developed and well-nourished.  HENT: Right crani incision/site stable Eyes: EOMI. No discharge.   Neck: Trach stoma closed    Cardiovascular: RRR Respiratory: CTA b/l. Unlabored.  GI: Soft. Bowel sounds are normal. PEG clean.  Musculoskeletal: No edema. Tenderness with palpation/ROM LUE and LLE Neurological:  Makes  eye contact and engages somewhat.  Spastic left hemiparesis trace tone LUE and tr to 1/4 LLE.  Moves RUE/RLE freely 5/5.  Skin: Skin is warm and dry.  Psychiatric: Normal behavior and mood.   Assessment/Plan: 1. 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 Physiatrist is providing close team supervision and 24 hour management of active medical problems listed below. Physiatrist and rehab team continue to assess barriers to discharge/monitor patient progress toward functional and medical goals.  Function:  Bathing Bathing  position   Position: Other (comment) (on open seat tub bench)  Bathing parts Body parts bathed by patient: Chest, Abdomen, Front perineal area, Right upper leg, Left upper leg Body parts bathed by helper: Right arm, Left arm, Buttocks, Right lower leg, Left lower leg, Back  Bathing assist Assist Level: 2 helpers      Upper Body Dressing/Undressing Upper body dressing   What is the patient wearing?: Button up shirt     Pull over shirt/dress - Perfomed by patient: Thread/unthread right sleeve Pull over shirt/dress - Perfomed by helper: Thread/unthread left sleeve, Put head through opening, Pull shirt over trunk Button up shirt - Perfomed by patient: Thread/unthread right sleeve Button up shirt - Perfomed by helper: Thread/unthread left sleeve, Pull shirt around back, Button/unbutton shirt    Upper body assist Assist Level:  (max a)      Lower Body Dressing/Undressing Lower body dressing   What is the patient wearing?: Pants, Shoes, Socks, Non-skid slipper socks     Pants- Performed by patient: Pull pants up/down Pants- Performed by helper: Thread/unthread right pants leg, Thread/unthread left pants leg, Pull pants up/down   Non-skid slipper socks- Performed by helper: Don/doff left sock   Socks - Performed by helper: Don/doff right sock   Shoes - Performed by helper: Don/doff right shoe, Don/doff left shoe, Fasten right, Fasten left   AFO - Performed by helper: Don/doff left AFO      Lower body assist Assist for lower body dressing: 2 Helpers  Toileting Toileting Toileting activity did not occur: Safety/medical concerns   Toileting steps completed by helper: Adjust clothing prior to toileting, Adjust clothing after toileting Toileting Assistive Devices: Other (comment)  Toileting assist Assist level: Two helpers   Transfers Chair/bed transfer   Chair/bed transfer method: Lateral scoot Chair/bed transfer assist level: Maximal assist (Pt 25 - 49%/lift and  lower) Chair/bed transfer assistive device: Sliding board Mechanical lift: Stedy   Locomotion Ambulation Ambulation activity did not occur: Safety/medical concerns   Max distance: 2 ft Assist level: 2 helpers (max assist & +2 for safety)   Wheelchair   Type: Manual Max wheelchair distance: 150 ft Assist Level: Moderate assistance (Pt 50 - 74%)  Cognition Comprehension Comprehension assist level: Understands basic 75 - 89% of the time/ requires cueing 10 - 24% of the time  Expression Expression assist level: Expresses basic 75 - 89% of the time/requires cueing 10 - 24% of the time. Needs helper to occlude trach/needs to repeat words.  Social Interaction Social Interaction assist level: Interacts appropriately 50 - 74% of the time - May be physically or verbally inappropriate.  Problem Solving Problem solving assist level: Solves basic 25 - 49% of the time - needs direction more than half the time to initiate, plan or complete simple activities  Memory Memory assist level: Recognizes or recalls 50 - 74% of the time/requires cueing 25 - 49% of the time     Medical Problem List and Plan: 1.  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  -cont CIR--demonstrating cognitive/functional gains  -continue  helmet when OOB  -Dr. Lionel DecemberFargen, [facility]---cranioplasty in a few weeks--would like to sync with rehab discharge.   2.  DVT Prophylaxis/Anticoagulation: Mechanical: Sequential compression devices, below knee Bilateral lower extremities. LLE dopplers 12/26 negative for DVT 3. Headaches/Pain Management: controlled at present  -gabapentin increased to 500mg  TID by PA yesterday     -continue hydrocodone at 10mg  prn  -behavioral component to pain is very present 4. Mood: LCSW to follow for evaluation and support when appropriate.   -depression has been an issue  -maintain zoloft at 100mg  daily  -add ritalin to boost mood and  activation/initiation 5. Neuropsych: This patient is not capable of making decisions on her own behalf. 6. Skin/Wound Care: routine pressure relief measures.  7. Fluids/Electrolytes/Nutrition:  D1/nectars per SLP  -reiterated to Vernona RiegerLaura that she needs to work through current diet/consistencies before we can advance  -I personally reviewed all of the patient's labs today, and lab work is within normal limits.  8. VDRF:  trach stoma closed 9. New onset seizures: Continue Keppra bid.  10. Spasticity/Neuropathy LUE/LLE: see above  -baclofen at 10mg  TID, increased to 20 TID on 1/14--tolerating thus far--appears effective  -LLE ROM, splinting  11. Hypotension/bradycardia  Asymptomatic 1/14  Will cont to monitor .  LOS (Days) 20 A FACE TO FACE EVALUATION WAS PERFORMED  Ranelle OysterSWARTZ,ZACHARY T, MD 06/09/2016 9:02 AM

## 2016-06-10 ENCOUNTER — Inpatient Hospital Stay (HOSPITAL_COMMUNITY): Payer: Managed Care, Other (non HMO) | Admitting: *Deleted

## 2016-06-10 ENCOUNTER — Inpatient Hospital Stay (HOSPITAL_COMMUNITY): Payer: Managed Care, Other (non HMO) | Admitting: Speech Pathology

## 2016-06-10 ENCOUNTER — Inpatient Hospital Stay (HOSPITAL_COMMUNITY): Payer: Managed Care, Other (non HMO) | Admitting: Physical Therapy

## 2016-06-10 ENCOUNTER — Inpatient Hospital Stay (HOSPITAL_COMMUNITY): Payer: Managed Care, Other (non HMO)

## 2016-06-10 ENCOUNTER — Inpatient Hospital Stay (HOSPITAL_COMMUNITY): Payer: Managed Care, Other (non HMO) | Admitting: Occupational Therapy

## 2016-06-10 LAB — GLUCOSE, CAPILLARY
GLUCOSE-CAPILLARY: 108 mg/dL — AB (ref 65–99)
GLUCOSE-CAPILLARY: 92 mg/dL (ref 65–99)

## 2016-06-10 MED ORDER — FREE WATER
200.0000 mL | Freq: Three times a day (TID) | Status: DC
  Administered 2016-06-10 (×2): 200 mL

## 2016-06-10 MED ORDER — AMPHETAMINE-DEXTROAMPHET ER 10 MG PO CP24
20.0000 mg | ORAL_CAPSULE | Freq: Every day | ORAL | Status: DC
  Administered 2016-06-11 – 2016-07-08 (×28): 20 mg via ORAL
  Filled 2016-06-10 (×28): qty 2

## 2016-06-10 MED ORDER — GABAPENTIN 250 MG/5ML PO SOLN
600.0000 mg | Freq: Three times a day (TID) | ORAL | Status: DC
  Administered 2016-06-10: 600 mg via ORAL
  Administered 2016-06-10: 500 mg via ORAL
  Administered 2016-06-11 – 2016-06-23 (×37): 600 mg via ORAL
  Filled 2016-06-10 (×45): qty 12

## 2016-06-10 NOTE — Progress Notes (Signed)
Occupational Therapy Session Note  Patient Details  Name: Cheryl Gibbs MRN: [id number] Date of Birth: 16-Jan-1991  Today's Date: 06/10/2016 OT Individual Time: 0800-0900 OT Individual Time Calculation (min): 60 min     Short Term Goals: Week 2:  OT Short Term Goal 1 (Week 2): Pt will be able to sit at EOB for 5 minutes with mod A to maintain balance OT Short Term Goal 1 - Progress (Week 2): Met OT Short Term Goal 2 (Week 2): Pt will don a shirt with mod A. OT Short Term Goal 2 - Progress (Week 2): Progressing toward goal OT Short Term Goal 3 (Week 2): Pt will use R hand for self ROM of L fingers. OT Short Term Goal 3 - Progress (Week 2): Progressing toward goal OT Short Term Goal 4 (Week 2): Pt will transfer onto a BSC with slide board with max A of 1 person.  OT Short Term Goal 4 - Progress (Week 2): Partly met OT Short Term Goal 5 (Week 2): Pt with sit to stand with mod A of 1 during toileting. OT Short Term Goal 5 - Progress (Week 2): Met Week 3:  OT Short Term Goal 1 (Week 3): Pt will be able to complete squat pivot from bed to chair to prepare for LB dressing with mod A of 1. OT Short Term Goal 2 (Week 3): Pt will be able to sit to stand at sink or EOB with min A to prepare for LB dressing.  OT Short Term Goal 3 (Week 3): From sitting EOB, pt will don RLE into pants and pull up to thigh level with min A. OT Short Term Goal 4 (Week 3): Pt will don pullover shirt with mod A.  Skilled Therapeutic Interventions/Progress Updates:   1:1 Pt in bed when arrived and agreeable to showering today. Used maxi move to transition into the bathroom onto padded tub bench.  Pt required min A to roll to her left and max  A to roll towards her right for pad placement. Used maxi move to help transition smoothly for first shower since incident. Pt able to bathe UB with min A and LB with mod A remaining seated. Pt positioned with pillow under left shoulder and along rib cage for more support. Pt enjoyed and  appreciated shower.  Pt with more attempts to communicate verbally today at the word/ phrase level. Pt continues to use white board and gestures to communicate  her wants and needs. Used STEDY with +2 to transition out of the shower (tub bench) with shoes donned for more support and pillow along her left knee for comfort. Pt sat in tilt in space to perform dressing. Min cuing for recall hemi technique. Pt tolerated modified positioning to cross left LE over right knee to assist with threading pant over left LE. Sit to stand with mod A with STEDY for total A for clothing management. Left to rest for a few min prior to next therapist with safety belt donned.   Therapy Documentation Precautions:  Precautions Precautions: Fall Precaution Comments: helmet when OOB Restrictions Weight Bearing Restrictions: No Pain: Pain Assessment Faces Pain Scale: Hurts whole lot Pain Type: Acute pain Pain Location: Hip Pain Orientation: Left Pain Descriptors / Indicators: Grimacing Pain Intervention(s): Medication (See eMAR)  See Function Navigator for Current Functional Status.   Therapy/Group: Individual Therapy  Willeen Cass [facility] 06/10/2016, 10:11 AM

## 2016-06-10 NOTE — Progress Notes (Signed)
Occupational Therapy Note  Patient Details  Name: Ned ClinesLaura Belleau MRN: [id number] Date of Birth: 09/17/1990  Today's Date: 06/10/2016 OT Missed Time: 30 Minutes Missed Time Reason: Other (comment) (pt toileting with nursing staff)   Pt missed 30 mins skilled OT services.  Pt using toilet with nursing staff.   Lavone NeriLanier, Courtnee Myer [facility] 06/10/2016, 2:30 PM

## 2016-06-10 NOTE — Progress Notes (Signed)
Recreational Therapy Session Note  Patient Details  Name: Ned ClinesLaura Caselli MRN: [id number] Date of Birth: 11/24/1990 Today's Date: 06/10/2016  Pain: c/o LLE pain, RN medicated Skilled Therapeutic Interventions/Progress Updates: Entrie session, pt focused on discussing diet restrictions via use of white board and voicing.  Reiterated diet restrictions and rationale behind those restrictions.   Treatment team all aware.  Yaviel Kloster 06/10/2016, 3:09 PM

## 2016-06-10 NOTE — Progress Notes (Signed)
[place] PHYSICAL MEDICINE & REHABILITATION     PROGRESS NOTE    Subjective/Complaints: Still having pain in left arm, leg. "Foot,hip, knee". Asked to have her adderall (aware she's taking ritalin now)  ROS: pt denies nausea, vomiting, diarrhea, cough, shortness of breath or chest pain  Objective: Vital Signs: Blood pressure (!) 99/59, pulse 69, temperature 98.4 F (36.9 C), temperature source Oral, resp. rate 16, height 5\' 4"  (1.626 m), weight 88.2 kg (194 lb 7.1 oz), last menstrual period 05/01/2016, SpO2 97 %. No results found.  Recent Labs  06/09/16 0539  WBC 13.9*  HGB 14.4  HCT 44.7  PLT 338    Recent Labs  06/09/16 0539  NA 140  K 4.2  CL 103  GLUCOSE 95  BUN 9  CREATININE 0.69  CALCIUM 9.9   CBG (last 3)   Recent Labs  06/09/16 1730 06/10/16 0018 06/10/16 0607  GLUCAP 109* 108* 92    Wt Readings from Last 3 Encounters:  06/10/16 88.2 kg (194 lb 7.1 oz)  05/20/16 84.4 kg (186 lb 1.6 oz)    Physical Exam:  Constitutional: She appears well-developed and well-nourished.  HENT: crani site cleaned Eyes: EOMI. No discharge.   Neck: Trach stoma closed    Cardiovascular: RRR Respiratory: CTA b/l. Unlabored.  GI: Soft. Bowel sounds are normal. PEG clean.  Musculoskeletal: No edema. Tenderness with palpation/ROM LUE and LLE today. Foot appears most tender.   - Neurological:  Makes  eye contact and engages somewhat.  Spastic left hemiparesis trace tone LUE and tr to 1/4 LLE.  Moves RUE/RLE freely 5/5.  Skin: Skin is warm and dry.  Psychiatric: Normal behavior and mood.   Assessment/Plan: 1. 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 Physiatrist is providing close team supervision and 24 hour management of active medical problems listed below. Physiatrist and rehab team continue to assess barriers to discharge/monitor  patient progress toward functional and medical goals.  Function:  Bathing Bathing position   Position: Shower  Bathing parts Body parts bathed by patient: Left arm, Chest, Abdomen, Front perineal area, Right upper leg, Left upper leg Body parts bathed by helper: Right lower leg, Left lower leg, Back, Buttocks, Right arm  Bathing assist Assist Level: Touching or steadying assistance(Pt > 75%)      Upper Body Dressing/Undressing Upper body dressing   What is the patient wearing?: Button up shirt     Pull over shirt/dress - Perfomed by patient: Thread/unthread right sleeve, Put head through opening Pull over shirt/dress - Perfomed by helper: Thread/unthread left sleeve, Pull shirt over trunk Button up shirt - Perfomed by patient: Thread/unthread right sleeve Button up shirt - Perfomed by helper: Thread/unthread left sleeve, Pull shirt around back, Button/unbutton shirt    Upper body assist Assist Level: Touching or steadying assistance(Pt > 75%)      Lower Body Dressing/Undressing Lower body dressing   What is the patient wearing?: Pants, Socks, Shoes     Pants- Performed by patient: Thread/unthread right pants leg Pants- Performed by helper: Thread/unthread left pants leg, Pull pants up/down   Non-skid slipper socks- Performed by helper: Don/doff left sock   Socks - Performed by helper: Don/doff right sock, Don/doff left sock   Shoes - Performed by helper: Don/doff right shoe, Don/doff left shoe, Fasten right, Fasten left   AFO - Performed by helper: Don/doff left AFO  Lower body assist Assist for lower body dressing: Touching or steadying assistance (Pt > 75%)      Toileting Toileting Toileting activity did not occur: Safety/medical concerns   Toileting steps completed by helper: Adjust clothing prior to toileting, Adjust clothing after toileting Toileting Assistive Devices: Other (comment)  Toileting assist Assist level: Two helpers   Transfers Chair/bed  transfer   Chair/bed transfer method: Lateral scoot Chair/bed transfer assist level: 2 helpers Chair/bed transfer assistive device: Sliding board Mechanical lift: Landscape architect Ambulation activity did not occur: Safety/medical concerns   Max distance: 2 ft Assist level: 2 helpers (max assist & +2 for safety)   Wheelchair   Type: Manual Max wheelchair distance: 150 ft Assist Level: Moderate assistance (Pt 50 - 74%)  Cognition Comprehension Comprehension assist level: Understands basic 75 - 89% of the time/ requires cueing 10 - 24% of the time  Expression Expression assist level: Expresses basic 50 - 74% of the time/requires cueing 25 - 49% of the time. Needs to repeat parts of sentences.  Social Interaction Social Interaction assist level: Interacts appropriately 50 - 74% of the time - May be physically or verbally inappropriate.  Problem Solving Problem solving assist level: Solves basic 25 - 49% of the time - needs direction more than half the time to initiate, plan or complete simple activities  Memory Memory assist level: Recognizes or recalls 50 - 74% of the time/requires cueing 25 - 49% of the time     Medical Problem List and Plan: 1.  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  -cont CIR--demonstrating cognitive/functional gains  -continue  helmet when OOB  -Dr. Lionel December, [facility]---cranioplasty in a few weeks--would like to sync with rehab discharge.   2.  DVT Prophylaxis/Anticoagulation: Mechanical: Sequential compression devices, below knee Bilateral lower extremities. LLE dopplers 12/26 negative for DVT 3. Headaches/Pain Management: controlled at present  -extremity pain still appears to be neuropathic  -increase gabapentin to 600mg  TID          -continue hydrocodone at 10mg  prn  -behavioral component to pain is very present 4. Mood: LCSW to follow for evaluation and support when  appropriate.   -depression has been an issue  -maintain zoloft at 100mg  daily  -will change to adderall. She took 24 mg daily at home 5. Neuropsych: This patient is not capable of making decisions on her own behalf. 6. Skin/Wound Care: routine pressure relief measures.  7. Fluids/Electrolytes/Nutrition:  D1/nectars per SLP  -reiterated to Cheryl Gibbs that she needs to work through current diet/consistencies before we can advance  -I personally reviewed all of the patient's labs today, and lab work is within normal limits.  8. VDRF:  trach stoma closed 9. New onset seizures: Continue Keppra bid.  10. Spasticity/Neuropathy LUE/LLE: see above  -baclofen at 10mg  TID, increased to 20 TID on 1/14--tolerating thus far  -LLE ROM, splinting  11. Hypotension/bradycardia  Asymptomatic 1/14  Will cont to monitor .  LOS (Days) 21 A FACE TO FACE EVALUATION WAS PERFORMED  Faith Rogue T, MD 06/10/2016 11:00 AM

## 2016-06-10 NOTE — Progress Notes (Addendum)
Physical Therapy Session Note  Patient Details  Name: Cheryl ClinesLaura Wendorff MRN: [id number] Date of Birth: 01/11/1991  Today's Date: 06/10/2016 PT Individual Time: 1009-1103 PT Individual Time Calculation (min): 54 min    Short Term Goals: Week 3:  PT Short Term Goal 1 (Week 3): Pt will consistently perform functional transfers with +1 max A PT Short Term Goal 2 (Week 3): Pt will propel manual w/c x 25' with min A PT Short Term Goal 3 (Week 3): Pt will maintain sitting balance for functional task with min A  Skilled Therapeutic Interventions/Progress Updates:    Pt received in TIS w/c requesting to see the snow outside. Pt also noting pain in LLE (RN made aware) but able to tolerate L tennis shoe donned during entire session. Pt attempting to verbally communicate during session as well as with dry erase board. Pt continues to require cuing to write complete thoughts as pt will write 2-3 words; pt with frustration with therapist when unable to understand what she is trying to communicate. Pt completed squat pivot TIS w/c>manual w/c on R with max assist and cuing to stand up with manual facilitation for posture and weight shifting to pivot to manual w/c. Pt propelled w/c throughout unit and down to main level of hospital with mod assist overall for obstacle avoidance and linear trajectory. Pt continues to demonstrate impaired coordination of RUE/LE for w/c propulsion despite max cuing. Pt also understands that her vision to L is impaired but is unable to follow cuing for compensatory strategy to look to L and continues to require max multimodal cuing for L attention. Back on unit pt transferred manual w/c>TIS w/c to L with first attempt being unsuccessful with only +1 assist and therefore required +2 assist to safely pivot to TIS w/c on L. Therapist provided blocking at L knee, cuing for sequencing, and multimodal cuing for posture and weight shifting. At end of session pt left in TIS w/c with QRB donned & all  needs within reach.   During session pt consumed yogurt with occasional cuing for small bites.   Therapy Documentation Precautions:  Precautions Precautions: Fall Precaution Comments: helmet when OOB Restrictions Weight Bearing Restrictions: No   See Function Navigator for Current Functional Status.   Therapy/Group: Individual Therapy  Sandi MariscalVictoria M Teal Raben 06/10/2016, 6:11 PM

## 2016-06-10 NOTE — Progress Notes (Signed)
Speech Language Pathology Daily Session Note  Patient Details  Name: Cheryl ClinesLaura Gibbs MRN: [id number] Date of Birth: 02/19/1991  Today's Date: 06/10/2016 SLP Individual Time: 0900-1000 SLP Individual Time Calculation (min): 60 min   Short Term Goals: Week 3: SLP Short Term Goal 1 (Week 3): Patient will scan to left field of enviornment to locate specific objects, etc with Min A verbal and visual cues.  SLP Short Term Goal 2 (Week 3): Patient will demonstrate sustained attention to functional tasks for 10 minutes with Min A verbal cues.  SLP Short Term Goal 3 (Week 3): Patient will demonstrate basic problem solving with Min A verbal cues.  SLP Short Term Goal 4 (Week 3): Patient will self-monitor and correct erros during written expression with supervision multimodal cues.  SLP Short Term Goal 5 (Week 3): Patient will verbalize at the word level with 50% intelligibility and Max A multimodal cues.  SLP Short Term Goal 6 (Week 3): Patient will consume current diet with miniaml overt s/s of aspiration and supervision verbal cues for use of swallowing compensatory strategies   Skilled Therapeutic Interventions: Skilled treatment session focused on speech and dysphagia goals. SLP facilitated session by providing skilled observation with trials of Dys. 2 textures. Patient demonstrated minimal but efficient mastication without overt s/s of aspiration but continues to only consume minimal amounts of trials. Recommend continued trials with SLP only. Patient also demonstrated improved ability to express wants/needs at the word and phrase level with extra time and Max A multimodal cues for use of an increased vocal intensity and over-articulation to achieve 100% intelligibility. Patient also utilized written communication with Mod A multimodal cues to self-monitor and correct errors. Patient left upright in wheelchair with all needs within reach. Continue with current plan of care.   Function:  Eating Eating    Modified Consistency Diet: Yes Eating Assist Level: More than reasonable amount of time;Supervision or verbal cues   Eating Set Up Assist For: Opening containers       Cognition Comprehension Comprehension assist level: Understands basic 75 - 89% of the time/ requires cueing 10 - 24% of the time  Expression Expression assistive device: Communication board Expression assist level: Expresses basic 50 - 74% of the time/requires cueing 25 - 49% of the time. Needs to repeat parts of sentences.  Social Interaction Social Interaction assist level: Interacts appropriately 50 - 74% of the time - May be physically or verbally inappropriate.  Problem Solving Problem solving assist level: Solves basic 25 - 49% of the time - needs direction more than half the time to initiate, plan or complete simple activities  Memory Memory assist level: Recognizes or recalls 50 - 74% of the time/requires cueing 25 - 49% of the time    Pain Pain Assessment Pain Assessment: 0-10 Pain Score: 8  Faces Pain Scale: Hurts whole lot Pain Type: Acute pain Pain Location: Hip Pain Orientation: Left Pain Descriptors / Indicators: Grimacing Pain Frequency: Intermittent Pain Onset: On-going Patients Stated Pain Goal: 2 Pain Intervention(s): Medication (See eMAR)  Therapy/Group: Individual Therapy  Marilouise Densmore 06/10/2016, 12:37 PM

## 2016-06-11 ENCOUNTER — Inpatient Hospital Stay (HOSPITAL_COMMUNITY): Payer: Managed Care, Other (non HMO) | Admitting: Occupational Therapy

## 2016-06-11 ENCOUNTER — Inpatient Hospital Stay (HOSPITAL_COMMUNITY): Payer: Managed Care, Other (non HMO) | Admitting: Speech Pathology

## 2016-06-11 ENCOUNTER — Inpatient Hospital Stay (HOSPITAL_COMMUNITY): Payer: Managed Care, Other (non HMO) | Admitting: Physical Therapy

## 2016-06-11 LAB — GLUCOSE, CAPILLARY
Glucose-Capillary: 92 mg/dL (ref 65–99)
Glucose-Capillary: 97 mg/dL (ref 65–99)

## 2016-06-11 MED ORDER — FREE WATER
100.0000 mL | Freq: Three times a day (TID) | Status: DC
  Administered 2016-06-11 – 2016-06-17 (×24): 100 mL

## 2016-06-11 NOTE — Progress Notes (Signed)
Speech Language Pathology Daily Session Note  Patient Details  Name: Cheryl Gibbs MRN: [id number] Date of Birth: 11/04/1990  Today's Date: 06/11/2016 SLP Individual Time: 1140-1210 SLP Individual Time Calculation (min): 30 min  Short Term Goals: Week 3: SLP Short Term Goal 1 (Week 3): Patient will scan to left field of enviornment to locate specific objects, etc with Min A verbal and visual cues.  SLP Short Term Goal 2 (Week 3): Patient will demonstrate sustained attention to functional tasks for 10 minutes with Min A verbal cues.  SLP Short Term Goal 3 (Week 3): Patient will demonstrate basic problem solving with Min A verbal cues.  SLP Short Term Goal 4 (Week 3): Patient will self-monitor and correct erros during written expression with supervision multimodal cues.  SLP Short Term Goal 5 (Week 3): Patient will verbalize at the word level with 50% intelligibility and Max A multimodal cues.  SLP Short Term Goal 6 (Week 3): Patient will consume current diet with miniaml overt s/s of aspiration and supervision verbal cues for use of swallowing compensatory strategies   Skilled Therapeutic Interventions: Skilled treatment session focused on dysphagia and communication goals. Pt with increased accuracy when writing phrases on white board to communicate. Pt was Mod I in monitoring written responses for accuracy and listener understanding. Pt attempted verbal communication for ~50% of session with ability to verbally communicate message at word/simple phrase level ~50%. Pt required supervision cues for sustaining attention to functional tasks for ~15 minute intervals. Pt consumed trials of ice chips without any overt s/s of aspiration. Pt with good progress made. Pt left upright in bed with all needs within reach. Continue current plan of care.      Function:  Eating Eating   Modified Consistency Diet: Yes Eating Assist Level: More than reasonable amount of time;Supervision or verbal cues   Eating  Set Up Assist For: Opening containers       Cognition Comprehension Comprehension assist level: Understands basic 75 - 89% of the time/ requires cueing 10 - 24% of the time  Expression Expression assistive device: Communication board Expression assist level: Expresses basic 50 - 74% of the time/requires cueing 25 - 49% of the time. Needs to repeat parts of sentences.  Social Interaction Social Interaction assist level: Interacts appropriately 50 - 74% of the time - May be physically or verbally inappropriate.  Problem Solving Problem solving assist level: Solves basic 25 - 49% of the time - needs direction more than half the time to initiate, plan or complete simple activities  Memory Memory assist level: Recognizes or recalls 50 - 74% of the time/requires cueing 25 - 49% of the time    Pain Pain Assessment Pain Score: 8   Therapy/Group: Individual Therapy  Jourdain Guay 06/11/2016, 12:14 PM

## 2016-06-11 NOTE — Progress Notes (Signed)
Social Work Patient ID: [name], female   DOB: 10/30/1990, 26 y.o.   MRN: [id number]   Spoke with pt's mother yesterday afternoon to review team conference and push further on d/c plans.  She is concerned about reported mood/ behavioral concerns as it is affecting participation.  Discussed plan for neuropsychology to evaluate her this week and medication adjustments are being made, however, depression would be very appropriate reaction to this devastating situation.  Asked mother to see if she could take some time from work to be here during the days for therapy and she plans to do this at the beginning of next week.  (I will confirm with her on Friday which days).   Unfortunately, mother still has no confirmed plan for 24/7 and I had to readdress same questions that she asked last week (i.e. Review of what insurance will cover;  Option of SNF vs privately hiring help).  Stressed to mother that we must establish caregivers and must understand that pt will require physical assistance and any caregiver needs to be able - bodied to provide this.  Repeatedly asked if there was ANYONE besides herself who might be able to help her and she says "no".  She also reports that pt's husband "Must" stay and complete his basic training and is not expected home until April.  I explained to her that her only option, if care at home cannot be provided, is for pt to go to SNF.  Mother continues to state that she does not want to do this and pleads with me, "I just ask you to keep in mind what you would do if this were your daughter."  I offered support to her but tried to explain that, while we all see the emotional strain of this on pt and family, I cannot offer any other options.  She states that she understands and asks that I give her until the first of the week to begin coming in for therapies and to make a decision about SNF.   Mother reports [facility] Feb  1st and asks if that we also "take that into consideration."  I will alert Dr. Riley KillSwartz and PA about this to see if can confirm as we had targeted a d/c date of 2/6.  Mother also reports that pt now has [employer] insurance in addition to [employer] and I have asked her to bring insurance information in to confirm this as well.     Cheryl Rainville, LCSW

## 2016-06-11 NOTE — Progress Notes (Signed)
Occupational Therapy Session Note  Patient Details  Name: Ned ClinesLaura Fabry MRN: [id number] Date of Birth: 06/22/1990  Today's Date: 06/11/2016 OT Individual Time: 1300-1400 OT Individual Time Calculation (min): 60 min    Short Term Goals: Week 3:  OT Short Term Goal 1 (Week 3): Pt will be able to complete squat pivot from bed to chair to prepare for LB dressing with mod A of 1. OT Short Term Goal 2 (Week 3): Pt will be able to sit to stand at sink or EOB with min A to prepare for LB dressing.  OT Short Term Goal 3 (Week 3): From sitting EOB, pt will don RLE into pants and pull up to thigh level with min A. OT Short Term Goal 4 (Week 3): Pt will don pullover shirt with mod A.  Skilled Therapeutic Interventions/Progress Updates:    Pt seen for OT session focusing on functional sitting balance, endurance, and ADL re-training. Pt in supine upon arrival. Pt attempting to communicate via whiteboard and one word phrases, stating something regarding eye appointment. Pt able to be re-directed with multiple cues. Pants donned total A +2 in supine, pt able to bridge to assist with pulling up of pants. With encouragement, pt willing to come to sitting EOB. Required verbal and tactile cues for sequencing and assist for advancing L UE/LE. Seated EOB, pt doffed and donned button up shirt. Requiring assist for hemi technique, and clothing management as pt putting clothes on inside-out and attempting to thread arms in method of pull over shirt.  Worked on sitting balance seated EOB, pt with consistent posterior LOB, however, with VCs pt able to self correct. Pt with increasing episodes of LOB when using R UE in functional manner, however, when UE supported on lap pt able to maintain static sitting balance with stand by assist. Tolerated ~35 minutes seated EOB. She transitioned into STEADY with mod A +1. Grooming tasks completed at sink while seated in STEADY, pt able to maintain balance in STEADY with stand be  assist. Pt transitioned into w/c and propelled w/c throughout unit using R UE/LE with min A as pt would veer to L and increased assist for turns. Pt left seated in w/c at end of session, QRB donned, and all needs in reach.   Therapy Documentation Precautions:  Precautions Precautions: Fall Precaution Comments: helmet when OOB Restrictions Weight Bearing Restrictions: No ADL: ADL ADL Comments: refer to navigator  See Function Navigator for Current Functional Status.   Therapy/Group: Individual Therapy  Lewis, Lakeisha Waldrop C 06/11/2016, 7:05 AM

## 2016-06-11 NOTE — Plan of Care (Signed)
Problem: RH BOWEL ELIMINATION Goal: RH STG MANAGE BOWEL WITH ASSISTANCE STG Manage Bowel with max Assistance.   Outcome: Not Progressing Remains total assist with management of bowel at night   Problem: RH BLADDER ELIMINATION Goal: RH STG MANAGE BLADDER WITH ASSISTANCE STG Manage Bladder With max Assistance   Outcome: Not Progressing Remains total assist [facility]

## 2016-06-11 NOTE — Progress Notes (Signed)
06/11/16 1500 nursing Patient refuse osmolite per tube  claims it makes her sick.

## 2016-06-11 NOTE — Progress Notes (Signed)
Nutrition Follow-up  DOCUMENTATION CODES:   Obesity unspecified  INTERVENTION:  Let pt attempt to eat at meals, if po intake is <50% at that meal provide a bolus feed via PEG at volume of 300 ml given up to TID after meals.   Continue 30 ml Prostat per tube.   Continue free water flushes of 100 ml QID.   Encourage PO intake.  NUTRITION DIAGNOSIS:   Inadequate oral intake related to inability to eat as evidenced by NPO status; diet advanced; ongoing  GOAL:   Patient will meet greater than or equal to 90% of their needs; met  MONITOR:   TF tolerance, Labs, Weight trends, Skin, I & O's  REASON FOR ASSESSMENT:   Consult Enteral/tube feeding initiation and management  ASSESSMENT:   26 year old female who was 10 days post partum and  was transferred to [facility] from OSH on 04/11/16  after being found unresponsive by husband.  Patient in bathtub--not submerged and activity seizing. 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  She underwent cerebral angiogram and decline neurologically shortly after admission to ICU. She was taken to OR emergently for right craniotomy for evacuation of IPH and obliteration of ruptured lenticulostriate aneurysm and placement of EVD. Trach and PEG were placed on 11/22.  She was admitted to [facility]) on 05/01/16. She is non verbal.  Meal completion has been 10-25%. Pt continues on a dysphagia 1 diet with nectar thick liquids. RD to continue with PRN bolus tube feeds as intake has been <50%. Pt encouraged to eat her foods at meals. RD to continue to monitor.   Diet Order:  DIET - DYS 1 Room service appropriate? Yes; Fluid consistency: Nectar Thick  Skin:   (Incision on head R)  Last BM:  1/17  Height:   Ht Readings from Last 1 Encounters:   05/20/16 _0  (1.626 m)    Weight:   Wt Readings from Last 1 Encounters:  06/10/16 194 lb 7.1 oz (88.2 kg)    Ideal Body Weight:  54.5 kg  BMI:  Body mass index is 33.38 kg/m.  Estimated Nutritional Needs:   Kcal:  1850-2050  Protein:  90-105 grams  Fluid:  1.8 - 2 L/day  EDUCATION NEEDS:   No education needs identified at this time  Corrin Parker, MS, RD, LDN Pager # [phone] After hours/ weekend pager # [phone]

## 2016-06-11 NOTE — Patient Care Conference (Signed)
Inpatient RehabilitationTeam Conference and Plan of Care Update Date: 06/09/2016   Time: 2:30 PM    Patient Name: Cheryl Gibbs      Medical Record Number: [id number]  Date of Birth: 09-09-1990 Sex: Female         Room/Bed: 4W16C/4W16C-01 Payor Info: Payor: Monia Pouch / Plan: Derrek Gu / Product Type: *No Product type* /    Admitting Diagnosis: Postpartum  Admit Date/Time:  05/20/2016  5:38 PM Admission Comments: No comment available   Primary Diagnosis:  Hemorrhage, intracranial (HCC) Principal Problem: Hemorrhage, intracranial (HCC)  Patient Active Problem List   Diagnosis Date Noted  . Hypotension   . Bradycardia   . Hemorrhage, intracranial (HCC) 05/20/2016  . Global aphasia   . Spastic hemiparesis affecting nondominant side (HCC)   . Vascular headache   . Dysphagia, post-stroke   . Tracheostomy status (HCC)   . Seizure (HCC)   . Neuropathy (HCC)   . Nonverbal     Expected Discharge Date: Expected Discharge Date:  (4+ weeks)  Team Members Present:       Current Status/Progress Goal Weekly Team Focus  Medical   demonstrating improvement in engagement, langugage. still with dense left HP. spasticity more in LLE  increase activitation/involvement of LLE during functional activitieis  nutrition/po intake, mood mgt, pain control, spasticity control   Bowel/Bladder   Continent of bladder. Incontinent of bowel 06/08/16  Managed bowel and bladder  Assist with time toileting q2hrs   Swallow/Nutrition/ Hydration   Dys. 1 textures with nectar-thick liquids, Mod-Max A  Min A with least restrictive diet  trials of upgraded textures/liquids    ADL's   very minimal progress, she has improved somewhat with sit to stand and UB bathing and dressing. Continues to need total A with LB self care and can not tolerate sitting on a BSC or tub bench for discomfort, severe tone LUE  mod A standing balance, tub transfer, toilet transfer, LB dressing; min A bathing and UB dressing; max A toileting   ADL retraining, balance, trunk control, LUE PROM, activity tolerance, cognitive activities, pt/family education   Mobility   +2 assist for safety, limited by pain & dysesthesia in LUE/LLE, no active movement noted in LUE/LLE, poor sitting balance  min assist overall for transfers & bed mobility, supervision for w/c mobility  pt education, bed mobility, transfers, L NMR   Communication   Overall Mod-Max A  Mod I for multimodal communication  voicing, verbal expression of wants/needs, functional communication with dry erase board   Safety/Cognition/ Behavioral Observations  Mod-Max A  Min A  attention, awareness, problem solving    Pain   Hycet q 4hrs r/t to L side discomfort.   <3  Monitor for effectiveness   Skin   Peg site with slight redness, drsg placed to area.   Keep skin CDI  Assess skin q shift      *See Care Plan and progress notes for long and short-term goals.  Barriers to Discharge: profound left sided deficits    Possible Resolutions to Barriers:  continued medication mgt, team working on ROM/orthotics    Discharge Planning/Teaching Needs:  Plan to d/c home to mother's house with spouse, mother and other family providing 24/7 assistance.  Need to start including mother more in therapy for education and encouragement to pt.   Team Discussion:  May be seeing an increase in mood/ behavioral issues emerging which are affecting therapies.  Neuropsych consult this week to address.  Continue to work on increasing po  intake.  Added ritalin.  Still +2 for OOB and can be min assist with sitting balance.  Can occasionally require less assistance.  Increased verbalizations if allowed more time.  SW reports that mother has not yet confirmed a plan of providing 24/7 care - concern may need to pursue SNF.  Revisions to Treatment Plan:  Not at this time.   Continued Need for Acute Rehabilitation Level of Care: The patient requires daily medical management by a physician with specialized  training in physical medicine and rehabilitation for the following conditions: Daily direction of a multidisciplinary physical rehabilitation program to ensure safe treatment while eliciting the highest outcome that is of practical value to the patient.: Yes Daily medical management of patient stability for increased activity during participation in an intensive rehabilitation regime.: Yes Daily analysis of laboratory values and/or radiology reports with any subsequent need for medication adjustment of medical intervention for : Post surgical problems;Wound care problems  Jamarian Jacinto 06/11/2016, 9:52 AM

## 2016-06-11 NOTE — Progress Notes (Signed)
Physical Therapy Session Note  Patient Details  Name: Ned ClinesLaura Crook MRN: [id number] Date of Birth: 04/23/1991  Today's Date: 06/11/2016 PT Individual Time: [id number] PT Individual Time Calculation (min): 25 min   Short Term Goals: Week 3:  PT Short Term Goal 1 (Week 3): Pt will consistently perform functional transfers with +1 max A PT Short Term Goal 2 (Week 3): Pt will propel manual w/c x 25' with min A PT Short Term Goal 3 (Week 3): Pt will maintain sitting balance for functional task with min A  Skilled Therapeutic Interventions/Progress Updates:      Pt received supine in bed and agreeable to PT at bed level. [facility] reporting that she had just gotten into bed.   BLE NMR including heel slides, attempted ankle pumps, hip abduction/adduction from neutral position, hip flexion/extension. Rollling R and L with mod assist.   Patient left supine in bed and agreeable to PT.    Therapy Documentation Precautions:  Precautions Precautions: Fall Precaution Comments: helmet when OOB Restrictions Weight Bearing Restrictions: No General:   Vital Signs: Therapy Vitals Temp: 98 F (36.7 C) Temp Source: Oral Pulse Rate: 74 Resp: 20 BP: 105/72 Patient Position (if appropriate): Sitting Oxygen Therapy SpO2: 98 % O2 Device: Not Delivered Pain: Pain Assessment Faces Pain Scale: Hurts a little bit   See Function Navigator for Current Functional Status.   Therapy/Group: Individual Therapy  Golden Popustin E Koda Routon 06/11/2016, 5:06 PM

## 2016-06-11 NOTE — Progress Notes (Signed)
Physical Therapy Note  Patient Details  Name: Cheryl Gibbs MRN: [id number] Date of Birth: 03/21/1991 Today's Date: 06/11/2016    Time: 730-825 55 minutes  1:1 Pt continues with c/o Lt UE and LE pain with touch and movement, RN made aware.  Rolling in bed to don pants with mod A, pt more participatory this session.  Sitting balance edge of bed with continued min/mod A, pt with little attempt to right self when losing balance. Pt able to perform squat pivot transfers during session with max A, +2 needed only for safety.  Sitting balance edge of mat x 20 minutes while playing wii bowling.  Pt requires  Hand over hand assist occasionally for use of wii remote, initially able to sit with close supervision but requires min/mod A when fatigued. Pt c/o back pain but unable to tolerate sitting in midline with good posture due to c/o Lt LE and UE pain, pt prefers posterior pelvic tilt and Lt lean. Pt with more verbalizations today, continues to only use words vs phrases on white board.   DONAWERTH,KAREN 06/11/2016, 8:26 AM

## 2016-06-11 NOTE — Progress Notes (Signed)
[facility]     PROGRESS NOTE    Subjective/Complaints: Up with therapy. Left leg/arm pain remains a focus.  ROS: pt denies nausea, vomiting, diarrhea, cough, shortness of breath or chest pain  Objective: Vital Signs: Blood pressure 100/69, pulse 61, temperature 97.5 F (36.4 C), temperature source Oral, resp. rate 18, height 5\' 4"  (1.626 m), weight 88.2 kg (194 lb 7.1 oz), last menstrual period 05/01/2016, SpO2 98 %. No results found.  Recent Labs  06/09/16 0539  WBC 13.9*  HGB 14.4  HCT 44.7  PLT 338    Recent Labs  06/09/16 0539  NA 140  K 4.2  CL 103  GLUCOSE 95  BUN 9  CREATININE 0.69  CALCIUM 9.9   CBG (last 3)   Recent Labs  06/10/16 0607 06/11/16 0005 06/11/16 0604  GLUCAP 92 92 97    Wt Readings from Last 3 Encounters:  06/10/16 88.2 kg (194 lb 7.1 oz)  05/20/16 84.4 kg (186 lb 1.6 oz)    Physical Exam:  Constitutional: She appears well-developed and well-nourished.  HENT: crani site cleaned Eyes: EOMI. No discharge.   Neck: Trach stoma closed    Cardiovascular: RRR Respiratory: clear bilaterally an unlabored  GI: Soft. Bowel sounds are normal. PEG clean.  Musculoskeletal: No edema. Tenderness with palpation/ROM LUE and LLE today. Foot appears most tender.   - Neurological:  Makes  eye contact and engages somewhat.  Spastic left hemiparesis trace tone LUE and tr to 1/4 LLE.  Moves RUE/RLE freely 5/5.  Skin: Skin is warm and dry.  Psychiatric: Normal behavior and mood.   Assessment/Plan: 1. 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 Physiatrist is providing close team supervision and 24 hour management of active medical problems listed below. Physiatrist and rehab team continue to assess barriers to discharge/monitor patient progress toward functional and medical  goals.  Function:  Bathing Bathing position   Position: Shower  Bathing parts Body parts bathed by patient: Left arm, Chest, Abdomen, Front perineal area, Right upper leg, Left upper leg Body parts bathed by helper: Right lower leg, Left lower leg, Back, Buttocks, Right arm  Bathing assist Assist Level: Touching or steadying assistance(Pt > 75%)      Upper Body Dressing/Undressing Upper body dressing   What is the patient wearing?: Button up shirt     Pull over shirt/dress - Perfomed by patient: Thread/unthread right sleeve, Put head through opening Pull over shirt/dress - Perfomed by helper: Thread/unthread left sleeve, Pull shirt over trunk Button up shirt - Perfomed by patient: Thread/unthread right sleeve Button up shirt - Perfomed by helper: Thread/unthread left sleeve, Pull shirt around back, Button/unbutton shirt    Upper body assist Assist Level: Touching or steadying assistance(Pt > 75%)      Lower Body Dressing/Undressing Lower body dressing   What is the patient wearing?: Pants, Socks, Shoes     Pants- Performed by patient: Thread/unthread right pants leg Pants- Performed by helper: Thread/unthread left pants leg, Pull pants up/down   Non-skid slipper socks- Performed by helper: Don/doff left sock   Socks - Performed by helper: Don/doff right sock, Don/doff left sock   Shoes - Performed by helper: Don/doff right shoe, Don/doff left shoe, Fasten right, Fasten left   AFO - Performed by helper: Don/doff left AFO      Lower body assist Assist for lower body dressing: Touching or  steadying assistance (Pt > 75%)      Toileting Toileting Toileting activity did not occur: Safety/medical concerns   Toileting steps completed by helper: Adjust clothing prior to toileting, Adjust clothing after toileting Toileting Assistive Devices: Other (comment)  Toileting assist Assist level: Two helpers   Transfers Chair/bed transfer   Chair/bed transfer method: Lateral  scoot Chair/bed transfer assist level: 2 helpers Chair/bed transfer assistive device: Sliding board Mechanical lift: Landscape architecttedy   Locomotion Ambulation Ambulation activity did not occur: Safety/medical concerns   Max distance: 2 ft Assist level: 2 helpers (max assist & +2 for safety)   Wheelchair   Type: Manual Max wheelchair distance: 100 ft Assist Level: Moderate assistance (Pt 50 - 74%)  Cognition Comprehension Comprehension assist level: Understands basic 75 - 89% of the time/ requires cueing 10 - 24% of the time  Expression Expression assist level: Expresses basic 50 - 74% of the time/requires cueing 25 - 49% of the time. Needs to repeat parts of sentences.  Social Interaction Social Interaction assist level: Interacts appropriately 50 - 74% of the time - May be physically or verbally inappropriate.  Problem Solving Problem solving assist level: Solves basic 25 - 49% of the time - needs direction more than half the time to initiate, plan or complete simple activities  Memory Memory assist level: Recognizes or recalls 50 - 74% of the time/requires cueing 25 - 49% of the time     Medical Problem List and Plan: 1.  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  -cont CIR--demonstrating cognitive/functional gains  -continue  helmet when OOB  -Dr. Lionel DecemberFargen, [facility]---cranioplasty in a few weeks--would like to sync with rehab discharge.   2.  DVT Prophylaxis/Anticoagulation: Mechanical: Sequential compression devices, below knee Bilateral lower extremities. LLE dopplers 12/26 negative for DVT 3. Headaches/Pain Management: controlled at present  -extremity pain still appears to be neuropathic  -increase gabapentin to 600mg  TID          -continue hydrocodone at 10mg  prn  -behavioral component to pain is very present 4. Mood: LCSW to follow for evaluation and support when appropriate.   -depression has been an  issue  -maintain zoloft at 100mg  daily  -adderall 20mg  XR form starts today.   5. Neuropsych: This patient is not capable of making decisions on her own behalf. 6. Skin/Wound Care: routine pressure relief measures.  7. Fluids/Electrolytes/Nutrition:  D1/nectars per SLP  -continue to push PO 8. VDRF:  trach stoma closed 9. New onset seizures: Continue Keppra bid.  10. Spasticity/Neuropathy LUE/LLE: see above  -baclofen at 10mg  TID, increased to 20 TID on 1/14--tolerating thus far  -LLE ROM, splinting  11. Hypotension/bradycardia  Asymptomatic 1/14  Will cont to monitor .  LOS (Days) 22 A FACE TO FACE EVALUATION WAS PERFORMED  Ranelle OysterSWARTZ,Kamill Fulbright T, MD 06/11/2016 8:58 AM

## 2016-06-11 NOTE — Progress Notes (Signed)
Speech Language Pathology Daily Session Note  Patient Details  Name: Cheryl ClinesLaura Gibbs MRN: [id number] Date of Birth: 09/01/1990  Today's Date: 06/11/2016 SLP Individual Time: 1030-1100 SLP Individual Time Calculation (min): 30 min  Short Term Goals: Week 3: SLP Short Term Goal 1 (Week 3): Patient will scan to left field of enviornment to locate specific objects, etc with Min A verbal and visual cues.  SLP Short Term Goal 2 (Week 3): Patient will demonstrate sustained attention to functional tasks for 10 minutes with Min A verbal cues.  SLP Short Term Goal 3 (Week 3): Patient will demonstrate basic problem solving with Min A verbal cues.  SLP Short Term Goal 4 (Week 3): Patient will self-monitor and correct erros during written expression with supervision multimodal cues.  SLP Short Term Goal 5 (Week 3): Patient will verbalize at the word level with 50% intelligibility and Max A multimodal cues.  SLP Short Term Goal 6 (Week 3): Patient will consume current diet with miniaml overt s/s of aspiration and supervision verbal cues for use of swallowing compensatory strategies   Skilled Therapeutic Interventions: Skilled treatment session focused on speech and dysphagia goals. SLP facilitated session by providing skilled observation with trials of Dys. 2 textures. Patient demonstrated efficient mastication with complete oral clearance with Min A verbal cues without overt s/s of aspiration. Recommend trial tray of Dys. 2 textures tomorrow prior to upgrade. Patient verbalizing at the word and phrase level with Mod A verbal cues needed for use of an increased vocal intensity to achieved 50% intelligibility. Patient also utilized written expression with Min A verbal cues to self-monitor and correct errors and for specificity. Patient left upright in bed with all needs within reach. Continue with current plan of care.    Function:  Eating Eating Eating activity did not occur: Safety/medical concerns Modified  Consistency Diet: Yes Eating Assist Level: More than reasonable amount of time;Supervision or verbal cues;Set up assist for   Eating Set Up Assist For: Opening containers       Cognition Comprehension Comprehension assist level: Understands basic 75 - 89% of the time/ requires cueing 10 - 24% of the time  Expression Expression assistive device: Communication board Expression assist level: Expresses basic 50 - 74% of the time/requires cueing 25 - 49% of the time. Needs to repeat parts of sentences.  Social Interaction Social Interaction assist level: Interacts appropriately 50 - 74% of the time - May be physically or verbally inappropriate.  Problem Solving Problem solving assist level: Solves basic 50 - 74% of the time/requires cueing 25 - 49% of the time  Memory Memory assist level: Recognizes or recalls 50 - 74% of the time/requires cueing 25 - 49% of the time    Pain No/denies Pain   Therapy/Group: Individual Therapy  Mcihael Hinderman 06/11/2016, 3:33 PM

## 2016-06-12 ENCOUNTER — Inpatient Hospital Stay (HOSPITAL_COMMUNITY): Payer: Managed Care, Other (non HMO) | Admitting: Occupational Therapy

## 2016-06-12 ENCOUNTER — Inpatient Hospital Stay (HOSPITAL_COMMUNITY): Payer: Managed Care, Other (non HMO) | Admitting: Physical Therapy

## 2016-06-12 ENCOUNTER — Encounter (HOSPITAL_COMMUNITY): Payer: Managed Care, Other (non HMO)

## 2016-06-12 ENCOUNTER — Inpatient Hospital Stay (HOSPITAL_COMMUNITY): Payer: Managed Care, Other (non HMO) | Admitting: Speech Pathology

## 2016-06-12 DIAGNOSIS — F4321 Adjustment disorder with depressed mood: Secondary | ICD-10-CM

## 2016-06-12 NOTE — Progress Notes (Signed)
Speech Language Pathology Weekly Progress and Session Note  Patient Details  Name: Cheryl Gibbs MRN: [id number] Date of Birth: December 24, 1990  Beginning of progress report period: June 05, 2016 End of progress report period: June 12, 2016  Today's Date: 06/12/2016 SLP Individual Time: 3435-6861 SLP Individual Time Calculation (min): 75 min  Short Term Goals: Week 3: SLP Short Term Goal 1 (Week 3): Patient will scan to left field of enviornment to locate specific objects, etc with Min A verbal and visual cues.  SLP Short Term Goal 1 - Progress (Week 3): Not met SLP Short Term Goal 2 (Week 3): Patient will demonstrate sustained attention to functional tasks for 10 minutes with Min A verbal cues.  SLP Short Term Goal 2 - Progress (Week 3): Not met SLP Short Term Goal 3 (Week 3): Patient will demonstrate basic problem solving with Min A verbal cues.  SLP Short Term Goal 3 - Progress (Week 3): Not met SLP Short Term Goal 4 (Week 3): Patient will self-monitor and correct erros during written expression with supervision multimodal cues.  SLP Short Term Goal 4 - Progress (Week 3): Revised due to lack of progress SLP Short Term Goal 5 (Week 3): Patient will verbalize at the word level with 50% intelligibility and Max A multimodal cues.  SLP Short Term Goal 5 - Progress (Week 3): Updated due to goal met SLP Short Term Goal 6 (Week 3): Patient will consume current diet with miniaml overt s/s of aspiration and supervision verbal cues for use of swallowing compensatory strategies  SLP Short Term Goal 6 - Progress (Week 3): Progressing toward goal    New Short Term Goals: Week 4: SLP Short Term Goal 1 (Week 4): Patient will scan to left field of enviornment to locate specific objects, etc with Min A verbal and visual cues.  SLP Short Term Goal 2 (Week 4): Patient will demonstrate sustained attention to functional tasks for 10 minutes with Min A verbal cues.  SLP Short Term Goal 3 (Week 4): Patient  will demonstrate basic problem solving with Min A verbal cues.  SLP Short Term Goal 4 (Week 4): Patient will self-monitor and correct errors during written expression with  Min A multimodal cues.  SLP Short Term Goal 5 (Week 4): Patient will verbalize at the word level with 50% intelligibility and Min A verbal cues.  SLP Short Term Goal 6 (Week 4): Patient will consume current diet with minimal overt s/s of aspiration and supervision verbal cues for use of swallowing compensatory strategies   Weekly Progress Updates: Pt has made variable progress this reporting period. Pt has made progress in the area of of spoken communication, specifically at the word level with emerging ability at the phrase level. Pt has consumed min trails of dysphagia 2 diet textures with successful and would benefit from further trials of a full tray before upgrading. Pt continues to exhibit deficits in sustained attention, attention to left field of environment, self-monitor and general awareness of her deficits. Skilled ST continues to be required to target these deficits and increase pt's independence prior to discharge.      Intensity: Minumum of 1-2 x/day, 30 to 90 minutes Frequency: 3 to 5 out of 7 days Duration/Length of Stay: 2/6 Treatment/Interventions: Functional tasks;Patient/family education;Cueing hierarchy;Dysphagia/aspiration precaution training;Therapeutic Activities;Cognitive remediation/compensation;Internal/external aids;Speech/Language facilitation   Daily Session  Skilled Therapeutic Interventions: Skilled treatment session focused on dysphagia and cognition goals. SLP facilitated session by providing skilled observation of consumption of dysphagia 2 with nectar thick liquids  lunch trays. Pt refused breakfast tray, it was held and available at lunch (it was dysphagia 2) as well as her dysphagia 2 lunch tray and various additional items offered by SLP as well as condiments etc. Pt largely refused all items  present and communicated via white board that she only wanted items that weren't available on current diet (i.e., bacon, tostitos). Max education and support given on progress of diet, need to demonstrate safe consumption of trials of dysphagia 2 before further upgrade. After 15 minutes of Max education and support, pt willing to consume 1/2 of minced sausage. With Mod A verbal cues, pt with minimal mastication and minimal lingual manipulation of bolus resulting in increased oral prep times of >2 minutes. Pt implemented finger sweep after each bolus to effectively clear left buccal cavity. Pt is not a candidate for further upgrade to dysphagia 2 at this time. Pt also required Mod A verbal cues for sustained attention to task for ~10 minutes as well as Mod A verbal cues for self-monitoring of written communication. Education provided to pt and nursing  that she will remain on dysphagia 1 with necatr thick liquids at this time. Pt returned to room, left upright in wheelchair with safety belt donned and all needs within reach. Continue current plan of care.     Function:   Eating Eating   Modified Consistency Diet: Yes Eating Assist Level: More than reasonable amount of time;Set up assist for;Supervision or verbal cues;Helper checks for pocketed food   Eating Set Up Assist For: Opening containers;Cutting food       Cognition Comprehension Comprehension assist level: Understands basic 75 - 89% of the time/ requires cueing 10 - 24% of the time  Expression Expression assistive device: Communication board Expression assist level: Expresses basic 50 - 74% of the time/requires cueing 25 - 49% of the time. Needs to repeat parts of sentences.  Social Interaction Social Interaction assist level: Interacts appropriately 50 - 74% of the time - May be physically or verbally inappropriate.  Problem Solving Problem solving assist level: Solves basic 50 - 74% of the time/requires cueing 25 - 49% of the time  Memory  Memory assist level: Recognizes or recalls 50 - 74% of the time/requires cueing 25 - 49% of the time   General    Pain Pain Assessment Pain Score: 4   Therapy/Group: Individual Therapy  Argelia Formisano 06/12/2016, 4:03 PM

## 2016-06-12 NOTE — Consult Note (Signed)
PSYCHODIAGNOSTIC EVALUATION - [facility]   Cheryl Gibbs is a 27 year old woman, who was seen for an initial psychodiagnostic evaluation in the setting of intracranial hemorrhage post-partum to assess for potential depression, anxiety, or other mental illness.    During the session, Ms. Park was tearful and spent significant time expressing the myriad of emotions that she is experiencing currently.  She communicated mostly via dry-erase board and usually in single word phrases.  She endorsed feeling frustrated, depressed, worthless, and helpless.  She also described a history of prior trauma, from which she said she has "PTSD." Time was spent validating her emotional experiences and normalizing her moods.  She also processed prior difficult experiences and we explored how she can utilize the coping strategies that she employed previously in her current situation.  Strengths-based psychotherapy techniques were used to help her recognize that she is the same person even though outwardly she may be different.  She was encouraged to remember her strengths and to apply them.  At the end of the session, she indicated that it was helpful for her to speak with the neuropsychologist.    IMPRESSION:  [name] is clearly experiencing symptoms of a major depressive episode, though may be within the context of an adjustment disorder to illness.  Given that she is prescribed an anti-depressant, no medication adjustments are likely indicated at this time.  She would, however, benefit from continued support and a follow-up with the neuropsychologist will be scheduled next week for that purpose.    DIAGNOSIS:   Adjustment disorder with depressed mood  [name], Psy.D.  Clinical Neuropsychologist

## 2016-06-12 NOTE — Progress Notes (Signed)
Physical Therapy Session Note  Patient Details  Name: Cheryl Gibbs MRN: [id number] Date of Birth: Dec 12, 1990  Today's Date: 06/12/2016 PT Individual Time: 8323-4688 PT Individual Time Calculation (min): 42 min   Short Term Goals: Week 3:  PT Short Term Goal 1 (Week 3): Pt will consistently perform functional transfers with +1 max A PT Short Term Goal 2 (Week 3): Pt will propel manual w/c x 25' with min A PT Short Term Goal 3 (Week 3): Pt will maintain sitting balance for functional task with min A  Skilled Therapeutic Interventions/Progress Updates:      WC mobility with mod assist x 142f with max cues for improved use of R LE to maintain straight path as well as to continue forward progression to prevent twisting wheels and increasing difficult of straight path.   Sitting balance on edge of WC 2 bout x 2 minutes with min assist. Reciprocal scooting in chair x 2 forward and backward mod assist with improved use of L side trunk activation to allow movement of the L hip.   stedy lift transfer with min-mod assist from PT with min cues for improved posture and prevention of lateral LOB to the L.    Bed mobility to supine from sitting with mod assist from PT with manage LE and position shoulders in bed. Mod cues for use of R LE to bridge and control pelvis.   Patient left supine in bed with call bell in reach and all needs met.      Therapy Documentation Precautions:  Precautions Precautions: Fall Precaution Comments: helmet when OOB Restrictions Weight Bearing Restrictions: No General:   Vital Signs: Therapy Vitals Temp: 98.4 F (36.9 C) Temp Source: Oral Pulse Rate: 71 Resp: 18 BP: 118/65 Patient Position (if appropriate): Sitting Oxygen Therapy SpO2: 100 % Pain: Pain Assessment Pain Score: 4   See Function Navigator for Current Functional Status.   Therapy/Group: Individual Therapy  ALorie Phenix1/19/2018, 3:47 PM

## 2016-06-12 NOTE — Progress Notes (Signed)
Occupational Therapy Session Note  Patient Details  Name: Cheryl Gibbs MRN: [id number] Date of Birth: 1991/01/20  Today's Date: 06/12/2016 OT Individual Time: 5993-5701 OT Individual Time Calculation (min): 59 min    Short Term Goals: Week 2:  OT Short Term Goal 1 (Week 2): Pt will be able to sit at EOB for 5 minutes with mod A to maintain balance OT Short Term Goal 1 - Progress (Week 2): Met OT Short Term Goal 2 (Week 2): Pt will don a shirt with mod A. OT Short Term Goal 2 - Progress (Week 2): Progressing toward goal OT Short Term Goal 3 (Week 2): Pt will use R hand for self ROM of L fingers. OT Short Term Goal 3 - Progress (Week 2): Progressing toward goal OT Short Term Goal 4 (Week 2): Pt will transfer onto a BSC with slide board with max A of 1 person.  OT Short Term Goal 4 - Progress (Week 2): Partly met OT Short Term Goal 5 (Week 2): Pt with sit to stand with mod A of 1 during toileting. OT Short Term Goal 5 - Progress (Week 2): Met Week 3:  OT Short Term Goal 1 (Week 3): Pt will be able to complete squat pivot from bed to chair to prepare for LB dressing with mod A of 1. OT Short Term Goal 2 (Week 3): Pt will be able to sit to stand at sink or EOB with min A to prepare for LB dressing.  OT Short Term Goal 3 (Week 3): From sitting EOB, pt will don RLE into pants and pull up to thigh level with min A. OT Short Term Goal 4 (Week 3): Pt will don pullover shirt with mod A.  Skilled Therapeutic Interventions/Progress Updates:    Pt seen for ADL retraining from w/c level with focus on trunk control, tolerance to movement/ touch of L side and communication. Pt received in bed and rolled to R with max A and then could push to sit with mod A.  Demonstrated a great deal of progress with sitting balance. Sat EOB for 1-2 minutes at a time with close S for a total of 5 min. 3x pt leaned too far back and needed max A to recover.  Used stedy for transfer with pt pulling into stand with steady A.   Improved balance in stedy sitting on pads with S.  From w/c, B/D.  Continues to be very sensitive with L hand for washing but tolerate more touch and movement with LLE.   Stood to sink 3x with max A to stabilize balance for weak LLE.  Pt resting in w/c with hand off to PT for next session.  Therapy Documentation Precautions:  Precautions Precautions: Fall Precaution Comments: helmet when OOB Restrictions Weight Bearing Restrictions: No    Vital Signs: Therapy Vitals Temp: 97.6 F (36.4 C) Temp Source: Oral Pulse Rate: 67 Resp: 16 BP: 99/60 Patient Position (if appropriate): Lying Oxygen Therapy SpO2: 100 % Pain: Pain Assessment Pain Assessment: 0-10 Pain Score: 3  Faces Pain Scale: Hurts a little bit Pain Type: Acute pain Pain Location: Leg Pain Orientation: Left Pain Descriptors / Indicators: Discomfort Pain Onset: On-going Patients Stated Pain Goal: 0 Pain Intervention(s): Medication (See eMAR);Repositioned ADL: ADL ADL Comments: refer to navigator  See Function Navigator for Current Functional Status.   Therapy/Group: Individual Therapy  SAGUIER,JULIA 06/12/2016, 8:25 AM

## 2016-06-12 NOTE — Progress Notes (Signed)
[place] PHYSICAL MEDICINE & REHABILITATION     PROGRESS NOTE    Subjective/Complaints: No problems over night. Sleeping soundly when I arrive. Did arouse.   ROS: Unable to obtain due to cognitive/mental status issues.   Objective: Vital Signs: Blood pressure 99/60, pulse 67, temperature 97.6 F (36.4 C), temperature source Oral, resp. rate 16, height 5\' 4"  (1.626 m), weight 88.2 kg (194 lb 7.1 oz), last menstrual period 05/01/2016, SpO2 100 %. No results found. No results for input(s): WBC, HGB, HCT, PLT in the last 72 hours. No results for input(s): NA, K, CL, GLUCOSE, BUN, CREATININE, CALCIUM in the last 72 hours.  Invalid input(s): CO CBG (last 3)   Recent Labs  06/10/16 0607 06/11/16 0005 06/11/16 0604  GLUCAP 92 92 97    Wt Readings from Last 3 Encounters:  06/10/16 88.2 kg (194 lb 7.1 oz)  05/20/16 84.4 kg (186 lb 1.6 oz)    Physical Exam:  Constitutional: She appears well-developed and well-nourished.  HENT: crani site clean, no swelling Eyes: EOMI. No discharge.   Neck: Trach stoma healed    Cardiovascular: RRR Respiratory: clear bilaterally an unlabored  GI: Soft. Bowel sounds are normal. PEG clean.  Musculoskeletal: No edema. Tenderness with palpation/ROM LUE and LLE today--better PROM tolerance today?.   - Neurological:  Makes  eye contact and engages somewhat.  Spastic left hemiparesis trace tone LUE and tr to 1/4 LLE.  Moves RUE/RLE freely 5/5.  Skin: Skin is warm and dry.  Psychiatric: Normal behavior and mood.   Assessment/Plan: 1. 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 Physiatrist is providing close team supervision and 24 hour management of active medical problems listed below. Physiatrist and rehab team continue to assess barriers to discharge/monitor patient progress toward functional and medical  goals.  Function:  Bathing Bathing position   Position: Shower  Bathing parts Body parts bathed by patient: Left arm, Chest, Abdomen, Front perineal area, Right upper leg, Left upper leg Body parts bathed by helper: Right lower leg, Left lower leg, Back, Buttocks, Right arm  Bathing assist Assist Level: Touching or steadying assistance(Pt > 75%)      Upper Body Dressing/Undressing Upper body dressing   What is the patient wearing?: Button up shirt     Pull over shirt/dress - Perfomed by patient: Thread/unthread right sleeve, Put head through opening Pull over shirt/dress - Perfomed by helper: Thread/unthread left sleeve, Pull shirt over trunk Button up shirt - Perfomed by patient: Thread/unthread right sleeve Button up shirt - Perfomed by helper: Thread/unthread left sleeve, Pull shirt around back, Button/unbutton shirt    Upper body assist Assist Level: Touching or steadying assistance(Pt > 75%)      Lower Body Dressing/Undressing Lower body dressing   What is the patient wearing?: Pants, Socks, Shoes     Pants- Performed by patient: Thread/unthread right pants leg Pants- Performed by helper: Thread/unthread left pants leg, Pull pants up/down, Thread/unthread right pants leg   Non-skid slipper socks- Performed by helper: Don/doff left sock   Socks - Performed by helper: Don/doff right sock, Don/doff left sock   Shoes - Performed by helper: Don/doff right shoe, Don/doff left shoe, Fasten right, Fasten left   AFO - Performed by helper: Don/doff left AFO      Lower body assist Assist for lower body dressing: 2 Helpers      Toileting Toileting Toileting activity did not  occur: Safety/medical concerns   Toileting steps completed by helper: Adjust clothing prior to toileting, Adjust clothing after toileting Toileting Assistive Devices: Other (comment)  Toileting assist Assist level: Two helpers   Transfers Chair/bed transfer   Chair/bed transfer method: Lateral  scoot Chair/bed transfer assist level: 2 helpers Chair/bed transfer assistive device: Sliding board Mechanical lift: Landscape architecttedy   Locomotion Ambulation Ambulation activity did not occur: Safety/medical concerns   Max distance: 2 ft Assist level: 2 helpers (max assist & +2 for safety)   Wheelchair   Type: Manual Max wheelchair distance: 100 ft Assist Level: Moderate assistance (Pt 50 - 74%)  Cognition Comprehension Comprehension assist level: Understands basic 75 - 89% of the time/ requires cueing 10 - 24% of the time  Expression Expression assist level: Expresses basic 25 - 49% of the time/requires cueing 50 - 75% of the time. Uses single words/gestures.  Social Interaction Social Interaction assist level: Interacts appropriately 50 - 74% of the time - May be physically or verbally inappropriate.  Problem Solving Problem solving assist level: Solves basic 50 - 74% of the time/requires cueing 25 - 49% of the time  Memory Memory assist level: Recognizes or recalls 50 - 74% of the time/requires cueing 25 - 49% of the time     Medical Problem List and Plan: 1.  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  -cont CIR-PT, OT, SLP  -continue  helmet when OOB  -Dr. Lionel DecemberFargen, [facility]---cranioplasty in a few weeks--would like to sync with rehab discharge.   2.  DVT Prophylaxis/Anticoagulation: Mechanical: Sequential compression devices, below knee Bilateral lower extremities. LLE dopplers 12/26 negative for DVT 3. Headaches/Pain Management: controlled at present  -extremity pain still appears to be neuropathic  -increased gabapentin to 600mg  TID          -continue hydrocodone at 10mg  prn  -behavioral component to pain is very present 4. Mood: LCSW to follow for evaluation and support when appropriate.   -depression has been an issue  -maintain zoloft at 100mg  daily (increased dose)  -adderall 20mg  XR for initiation/engagement  (was also using at home)   5. Neuropsych: This patient is not capable of making decisions on her own behalf. 6. Skin/Wound Care: routine pressure relief measures.  7. Fluids/Electrolytes/Nutrition:  D1/nectars per SLP  -continue to push PO 8. VDRF:  trach stoma closed 9. New onset seizures: Continue Keppra bid.  10. Spasticity/Neuropathy LUE/LLE: see above  -baclofen at 10mg  TID, increased to 20 TID on 1/14--tolerating thus far  -LLE ROM, splinting  11. Hypotension/bradycardia  Asymptomatic 1/14  Will cont to monitor .  LOS (Days) 23 A FACE TO FACE EVALUATION WAS PERFORMED  Faith RogueSWARTZ,Thersia Petraglia T, MD 06/12/2016 10:00 AM

## 2016-06-12 NOTE — Progress Notes (Signed)
Physical Therapy Session Note  Patient Details  Name: Ned ClinesLaura Hilley MRN: [id number] Date of Birth: 10/21/1990  Today's Date: 06/12/2016 PT Individual Time: [id number] PT Individual Time Calculation (min): 45 min   Short Term Goals: Week 3:  PT Short Term Goal 1 (Week 3): Pt will consistently perform functional transfers with +1 max A PT Short Term Goal 2 (Week 3): Pt will propel manual w/c x 25' with min A PT Short Term Goal 3 (Week 3): Pt will maintain sitting balance for functional task with min A  Skilled Therapeutic Interventions/Progress Updates:    Pt up in w/c prior to session. Working on w/c mobility, having difficulty with propulsion in straight line and maneuvering around objects in hall. Encouraging use of Rt LE to assist with steering. In w/c working on scooting fwd/bkwd and weight shifts as well as sitting posture. Pt with tendency to lean toward Lt side. Encouraging communication verbal and written in sitting. Pt engaging with discussion regarding street racing and spouse. In parallel bars working on sit/stand transfers, posture and progressing to weight bearing through LLE with PT blocking knee. Improving posture with cues but fatigues quickly. Pt engaged throughout session.   Therapy Documentation Precautions:  Precautions Precautions: Fall Precaution Comments: helmet when OOB Restrictions Weight Bearing Restrictions: No Pain: Denies pain   See Function Navigator for Current Functional Status.   Therapy/Group: Individual Therapy  Delton SeeBenjamin Levoy Geisen, PT, CSCS 06/12/2016, 12:32 PM

## 2016-06-12 NOTE — Progress Notes (Signed)
Social Work Patient ID: Cheryl ClinesLaura Gibbs, female   DOB: 01/14/1991, 26 y.o.   MRN: [id number]   Spoke with pt's mother today to request, per OT, if a pair of larger shoes be brought in to accommodate the AFOs.  She will try to do so over the weekend.  She also plans to be here to attend therapies on Tuesday.  Reported to her that pt was seen by neuropsychologist this morning and felt to be a very beneficial meeting.  Will continue to follow.  Doreen Garretson, LCSW

## 2016-06-13 ENCOUNTER — Inpatient Hospital Stay (HOSPITAL_COMMUNITY): Payer: Managed Care, Other (non HMO)

## 2016-06-13 ENCOUNTER — Inpatient Hospital Stay (HOSPITAL_COMMUNITY): Payer: Managed Care, Other (non HMO) | Admitting: Speech Pathology

## 2016-06-13 LAB — GLUCOSE, CAPILLARY
GLUCOSE-CAPILLARY: 83 mg/dL (ref 65–99)
Glucose-Capillary: 96 mg/dL (ref 65–99)

## 2016-06-13 MED ORDER — SERTRALINE HCL 100 MG PO TABS
100.0000 mg | ORAL_TABLET | Freq: Every day | ORAL | Status: DC
  Administered 2016-06-14 – 2016-06-23 (×10): 100 mg
  Filled 2016-06-13 (×12): qty 1

## 2016-06-13 NOTE — Progress Notes (Signed)
[place] PHYSICAL MEDICINE & REHABILITATION     PROGRESS NOTE    Subjective/Complaints: Sleeping upon my arrival. Uneventful night.   ROS: Unable to obtain due to cognitive/mental status issues.   Objective: Vital Signs: Blood pressure (!) 90/50, pulse 78, temperature 98 F (36.7 C), temperature source Oral, resp. rate 18, height 5\' 4"  (1.626 m), weight 88.2 kg (194 lb 7.1 oz), last menstrual period 05/01/2016, SpO2 92 %. No results found. No results for input(s): WBC, HGB, HCT, PLT in the last 72 hours. No results for input(s): NA, K, CL, GLUCOSE, BUN, CREATININE, CALCIUM in the last 72 hours.  Invalid input(s): CO CBG (last 3)   Recent Labs  06/11/16 0005 06/11/16 0604 06/13/16 0743  GLUCAP 92 97 96    Wt Readings from Last 3 Encounters:  06/10/16 88.2 kg (194 lb 7.1 oz)  05/20/16 84.4 kg (186 lb 1.6 oz)    Physical Exam:  Constitutional: She appears well-developed and well-nourished.  HENT: crani site clean,   Eyes: EOMI. No discharge.   Neck: Trach stoma healed    Cardiovascular: RRR Respiratory: clear bilaterally an unlabored  GI: Soft. Bowel sounds are normal. PEG stie clean.  Musculoskeletal: No edema. Tenderness with palpation/ROM LUE and LLE today--better PROM tolerance today?.   - Neurological:  Makes  eye contact and engages somewhat.  Spastic left hemiparesis trace tone LUE and tr to 1/4 LLE.--unchanged  Moves RUE/RLE freely 5/5.  Skin: Skin is warm and dry.  Psychiatric: makes eye contact. Engages inconsistently  Assessment/Plan: 1. 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 Physiatrist is providing close team supervision and 24 hour management of active medical problems listed below. Physiatrist and rehab team continue to assess barriers to discharge/monitor patient progress toward functional and medical  goals.  Function:  Bathing Bathing position   Position: Wheelchair/chair at sink  Bathing parts Body parts bathed by patient: Left arm, Chest, Abdomen, Right upper leg, Left upper leg, Front perineal area Body parts bathed by helper: Right lower leg, Left lower leg, Back, Buttocks, Right arm  Bathing assist Assist Level: Touching or steadying assistance(Pt > 75%)      Upper Body Dressing/Undressing Upper body dressing   What is the patient wearing?: Pull over shirt/dress     Pull over shirt/dress - Perfomed by patient: Thread/unthread right sleeve, Put head through opening, Pull shirt over trunk Pull over shirt/dress - Perfomed by helper: Thread/unthread left sleeve Button up shirt - Perfomed by patient: Thread/unthread right sleeve Button up shirt - Perfomed by helper: Thread/unthread left sleeve, Pull shirt around back, Button/unbutton shirt    Upper body assist Assist Level: Touching or steadying assistance(Pt > 75%)      Lower Body Dressing/Undressing Lower body dressing   What is the patient wearing?: Pants, Socks, Shoes     Pants- Performed by patient: Thread/unthread right pants leg, Thread/unthread left pants leg (with A to stabilize left leg in crossed position) Pants- Performed by helper: Pull pants up/down   Non-skid slipper socks- Performed by helper: Don/doff left sock   Socks - Performed by helper: Don/doff right sock, Don/doff left sock   Shoes - Performed by helper: Don/doff right shoe, Don/doff left shoe, Fasten right, Fasten left   AFO - Performed by helper: Don/doff left AFO      Lower body assist Assist for lower body dressing: 2 Surveyor, minerals  Toileting activity did not occur: Safety/medical concerns   Toileting steps completed by helper: Adjust clothing prior to toileting, Performs perineal hygiene, Adjust clothing after toileting Toileting Assistive Devices: Other (comment)  Toileting assist Assist level: Two helpers    Transfers Chair/bed transfer   Chair/bed transfer method: Lateral scoot Chair/bed transfer assist level: Moderate assist (Pt 50 - 74%/lift or lower) Chair/bed transfer assistive device: Sliding board Mechanical lift: Stedy   Locomotion Ambulation Ambulation activity did not occur: Safety/medical concerns   Max distance: 2 ft Assist level: 2 helpers (max assist & +2 for safety)   Wheelchair   Type: Manual Max wheelchair distance: 15 Assist Level: Touching or steadying assistance (Pt > 75%)  Cognition Comprehension Comprehension assist level: Understands basic 75 - 89% of the time/ requires cueing 10 - 24% of the time  Expression Expression assist level: Expresses basic 50 - 74% of the time/requires cueing 25 - 49% of the time. Needs to repeat parts of sentences.  Social Interaction Social Interaction assist level: Interacts appropriately 50 - 74% of the time - May be physically or verbally inappropriate.  Problem Solving Problem solving assist level: Solves basic 50 - 74% of the time/requires cueing 25 - 49% of the time  Memory Memory assist level: Recognizes or recalls 50 - 74% of the time/requires cueing 25 - 49% of the time     Medical Problem List and Plan: 1.  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  -cont CIR-PT, OT, SLP  -continue  helmet when OOB  -Dr. Lionel DecemberFargen, [facility]---cranioplasty in a few weeks--would like to sync with rehab discharge.   2.  DVT Prophylaxis/Anticoagulation: Mechanical: Sequential compression devices, below knee Bilateral lower extremities. LLE dopplers 12/26 negative for DVT 3. Headaches/Pain Management: controlled at present  -extremity pain still appears to be neuropathic  -increased gabapentin to 600mg  TID          -continue hydrocodone at 10mg  prn  -behavioral component to pain is very present 4. Mood: LCSW to follow for evaluation and support when appropriate.   -depression  has been an issue  -increase zoloft to 100mg  daily (dose not increased initially)  -adderall 20mg  XR added for initiation/engagement (was also using at home)   5. Neuropsych: This patient is not capable of making decisions on her own behalf. 6. Skin/Wound Care: routine pressure relief measures.  7. Fluids/Electrolytes/Nutrition:  D1/nectars per SLP  -continue to push PO 8. VDRF:  trach stoma closed 9. New onset seizures: Continue Keppra bid.  10. Spasticity/Neuropathy LUE/LLE: see above  -baclofen at 10mg  TID, increased to 20 TID on 1/14--tolerating thus far  -LLE ROM, splinting  11. Hypotension/bradycardia  Asymptomatic 1/14  Will cont to monitor .  LOS (Days) 24 A FACE TO FACE EVALUATION WAS PERFORMED  Ranelle OysterSWARTZ,Yariah Selvey T, MD 06/13/2016 9:23 AM

## 2016-06-13 NOTE — Progress Notes (Signed)
Speech Language Pathology Daily Session Note  Patient Details  Name: Cheryl Gibbs MRN: [id number] Date of Birth: 02/20/1991  Today's Date: 06/13/2016 SLP Individual Time: 1303-1330 SLP Individual Time Calculation (min): 27 min  Short Term Goals: Week 4: SLP Short Term Goal 1 (Week 4): Patient will scan to left field of enviornment to locate specific objects, etc with Min A verbal and visual cues.  SLP Short Term Goal 2 (Week 4): Patient will demonstrate sustained attention to functional tasks for 10 minutes with Min A verbal cues.  SLP Short Term Goal 3 (Week 4): Patient will demonstrate basic problem solving with Min A verbal cues.  SLP Short Term Goal 4 (Week 4): Patient will self-monitor and correct errors during written expression with  Min A multimodal cues.  SLP Short Term Goal 5 (Week 4): Patient will verbalize at the word level with 50% intelligibility and Min A verbal cues.  SLP Short Term Goal 6 (Week 4): Patient will consume current diet with minimal overt s/s of aspiration and supervision verbal cues for use of swallowing compensatory strategies   Skilled Therapeutic Interventions:  Pt was seen for skilled ST targeting communication goals.  Pt needed min assist verbal cues to monitor and correct written errors when communicating via white board.  She was able to verbalize at the word level in ~75% of opportunities with min-mod assist verbal and visual cues to correct verbal errors.  Pt perseverative on ongoing tooth pain impacting mastication of advanced solids and needed mod assist verbal cues for redirection.  Pt was left in wheelchair with call bell within reach.  Continue per current plan of care.       Function:  Eating Eating                 Cognition Comprehension Comprehension assist level: Understands basic 75 - 89% of the time/ requires cueing 10 - 24% of the time  Expression Expression assistive device: Communication board (word level utterances supplemented by  written expression via white board) Expression assist level: Expresses basic 25 - 49% of the time/requires cueing 50 - 75% of the time. Uses single words/gestures.  Social Interaction Social Interaction assist level: Interacts appropriately 50 - 74% of the time - May be physically or verbally inappropriate.  Problem Solving Problem solving assist level: Solves basic 50 - 74% of the time/requires cueing 25 - 49% of the time  Memory Memory assist level: Recognizes or recalls 50 - 74% of the time/requires cueing 25 - 49% of the time    Pain Pain Assessment Pain Assessment: No/denies pain   Therapy/Group: Individual Therapy  Amauri Medellin, Melanee SpryNicole L 06/13/2016, 4:12 PM

## 2016-06-14 ENCOUNTER — Inpatient Hospital Stay (HOSPITAL_COMMUNITY): Payer: Managed Care, Other (non HMO) | Admitting: Occupational Therapy

## 2016-06-14 NOTE — Progress Notes (Signed)
[facility] MEDICINE & REHABILITATION     PROGRESS NOTE    Subjective/Complaints: Expressed through words and writing that foot was jammed into elevator door when out with mother  ROS: limited due to cognitive/mental status issues.  Objective: Vital Signs: Blood pressure (!) 100/50, pulse 60, temperature 97.8 F (36.6 C), temperature source Oral, resp. rate 18, height 5\' 4"  (1.626 m), weight 88.2 kg (194 lb 7.1 oz), last menstrual period 05/01/2016, SpO2 99 %. No results found. No results for input(s): WBC, HGB, HCT, PLT in the last 72 hours. No results for input(s): NA, K, CL, GLUCOSE, BUN, CREATININE, CALCIUM in the last 72 hours.  Invalid input(s): CO CBG (last 3)   Recent Labs  06/13/16 0743 06/13/16 1150  GLUCAP 96 83    Wt Readings from Last 3 Encounters:  06/10/16 88.2 kg (194 lb 7.1 oz)  05/20/16 84.4 kg (186 lb 1.6 oz)    Physical Exam:  Constitutional: She appears well-developed and well-nourished.  HENT: crani site clean,   Eyes: EOMI. No discharge.   Neck: Trach stoma healed    Cardiovascular: RRR Respiratory: clear bilaterally an unlabored  GI: Soft. Bowel sounds are normal. PEG stie clean.  Musculoskeletal: No edema. Better tolerance of PROM LLE today!! - Neurological: communicating through single words. Writes on [employer] Makes  eye contact and engages somewhat.  Spastic left hemiparesis trace tone LUE and tr to 1/4 LLE.--unchanged  Moves RUE/RLE freely 5/5.  Skin: Skin is warm and dry.  Psychiatric: makes eye contact. Engages inconsistently  Assessment/Plan: 1. 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 Physiatrist is providing close team supervision and 24 hour management of active medical problems listed below. Physiatrist and rehab team continue to assess barriers to discharge/monitor patient progress  toward functional and medical goals.  Function:  Bathing Bathing position   Position: Wheelchair/chair at sink  Bathing parts Body parts bathed by patient: Left arm, Chest, Abdomen Body parts bathed by helper: Right arm, Front perineal area, Buttocks, Right upper leg, Left upper leg, Right lower leg, Left lower leg, Back  Bathing assist Assist Level: Touching or steadying assistance(Pt > 75%)      Upper Body Dressing/Undressing Upper body dressing   What is the patient wearing?: Pull over shirt/dress     Pull over shirt/dress - Perfomed by patient: Thread/unthread right sleeve, Put head through opening, Pull shirt over trunk Pull over shirt/dress - Perfomed by helper: Thread/unthread left sleeve Button up shirt - Perfomed by patient: Thread/unthread right sleeve, Button/unbutton shirt Button up shirt - Perfomed by helper: Thread/unthread left sleeve, Pull shirt around back    Upper body assist Assist Level: Touching or steadying assistance(Pt > 75%)      Lower Body Dressing/Undressing Lower body dressing   What is the patient wearing?: Pants, Shoes     Pants- Performed by patient: Thread/unthread left pants leg, Pull pants up/down Pants- Performed by helper: Thread/unthread left pants leg, Pull pants up/down   Non-skid slipper socks- Performed by helper: Don/doff left sock   Socks - Performed by helper: Don/doff right sock, Don/doff left sock Shoes - Performed by patient: Don/doff right shoe, Don/doff left shoe Shoes - Performed by helper: Don/doff right shoe, Don/doff left shoe, Fasten right, Fasten left   AFO - Performed by helper: Don/doff left AFO      Lower body assist Assist for lower body dressing: 2 Helpers  Toileting Toileting Toileting activity did not occur: Safety/medical concerns   Toileting steps completed by helper: Adjust clothing prior to toileting, Performs perineal hygiene, Adjust clothing after toileting Toileting Assistive Devices: Grab bar or  rail, Other (comment) (stedy)  Toileting assist Assist level: Two helpers   Transfers Chair/bed transfer   Chair/bed transfer method: Lateral scoot Chair/bed transfer assist level: Moderate assist (Pt 50 - 74%/lift or lower) Chair/bed transfer assistive device: Sliding board Mechanical lift: Stedy   Locomotion Ambulation Ambulation activity did not occur: Safety/medical concerns   Max distance: 2 ft Assist level: 2 helpers (max assist & +2 for safety)   Wheelchair   Type: Manual Max wheelchair distance: 15 Assist Level: Touching or steadying assistance (Pt > 75%)  Cognition Comprehension Comprehension assist level: Understands basic 75 - 89% of the time/ requires cueing 10 - 24% of the time  Expression Expression assist level: Expresses basic 25 - 49% of the time/requires cueing 50 - 75% of the time. Uses single words/gestures.  Social Interaction Social Interaction assist level: Interacts appropriately 50 - 74% of the time - May be physically or verbally inappropriate.  Problem Solving Problem solving assist level: Solves basic 50 - 74% of the time/requires cueing 25 - 49% of the time  Memory Memory assist level: Recognizes or recalls 50 - 74% of the time/requires cueing 25 - 49% of the time     Medical Problem List and Plan: 1.  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  -cont CIR-PT, OT, SLP  -continue  helmet when OOB  -Dr. Lionel December, [facility]---cranioplasty in a few weeks--would like to sync with rehab discharge.   2.  DVT Prophylaxis/Anticoagulation: Mechanical: Sequential compression devices, below knee Bilateral lower extremities. LLE dopplers 12/26 negative for DVT 3. Headaches/Pain Management: controlled at present  -extremity pain still appears to be neuropathic  -increased gabapentin to 600mg  TID          -continue hydrocodone at 10mg  prn  -behavioral component to pain is very present  -pain  actually IMPROVED today after elevator incident 4. Mood: LCSW to follow for evaluation and support when appropriate.   -depression has been an issue  -increase zoloft to 100mg  daily (dose not increased initially)  -adderall 20mg  XR added for initiation/engagement (was also using at home)   5. Neuropsych: This patient is not capable of making decisions on her own behalf. 6. Skin/Wound Care: routine pressure relief measures.  7. Fluids/Electrolytes/Nutrition:  D1/nectars per SLP  -continue to push PO 8. VDRF:  trach stoma closed 9. New onset seizures: Continue Keppra bid.  10. Spasticity/Neuropathy LUE/LLE: see above  -baclofen at 10mg  TID, increased to 20 TID on 1/14--tolerating thus far  -LLE ROM, splinting  11. Hypotension/bradycardia  Asymptomatic 1/14  Will cont to monitor .  LOS (Days) 25 A FACE TO FACE EVALUATION WAS PERFORMED  Ranelle Oyster, MD 06/14/2016 9:52 AM

## 2016-06-14 NOTE — Progress Notes (Addendum)
Patients grandfather brought in biscuit and gravy for patient to eat at lunch. RN walked in when grandfather was preparing meal. "I am tearing this up into small pieces for her to eat." RN educated father on D1 diet and foods that she is able to eat. GrandFather stated her mom brought it in yesterday and she ate that for lunch. RN discussed with patient and dad that she is not allowed to eat the biscuit and gravy due to her diet. RN educated on risk factors of not following diet. grandfather heated up food anyway and gave it to her. RN will continue to educate family and patient.

## 2016-06-14 NOTE — Progress Notes (Signed)
Occupational Therapy Session Note  Patient Details  Name: Cheryl Gibbs MRN: [id number] Date of Birth: 09-10-1990  Today's Date: 06/14/2016 OT Individual Time: 2633-3545 OT Individual Time Calculation (min): 56 min    Short Term Goals: Week 1:  OT Short Term Goal 1 (Week 1): Pt will be able to sit at EOB for 5 minutes with mod A to maintain balance. OT Short Term Goal 1 - Progress (Week 1): Progressing toward goal OT Short Term Goal 2 (Week 1): Pt will tolerate self bathing her LUE. OT Short Term Goal 2 - Progress (Week 1): Met OT Short Term Goal 3 (Week 1): Pt will don a shirt with mod A. OT Short Term Goal 3 - Progress (Week 1): Progressing toward goal OT Short Term Goal 4 (Week 1): Pt will sit to stand with 1 person A with max A. OT Short Term Goal 4 - Progress (Week 1): Met OT Short Term Goal 5 (Week 1): Pt will use R hand for self ROM of L fingers. OT Short Term Goal 5 - Progress (Week 1): Progressing toward goal Week 2:  OT Short Term Goal 1 (Week 2): Pt will be able to sit at EOB for 5 minutes with mod A to maintain balance OT Short Term Goal 1 - Progress (Week 2): Met OT Short Term Goal 2 (Week 2): Pt will don a shirt with mod A. OT Short Term Goal 2 - Progress (Week 2): Progressing toward goal OT Short Term Goal 3 (Week 2): Pt will use R hand for self ROM of L fingers. OT Short Term Goal 3 - Progress (Week 2): Progressing toward goal OT Short Term Goal 4 (Week 2): Pt will transfer onto a BSC with slide board with max A of 1 person.  OT Short Term Goal 4 - Progress (Week 2): Partly met OT Short Term Goal 5 (Week 2): Pt with sit to stand with mod A of 1 during toileting. OT Short Term Goal 5 - Progress (Week 2): Met Week 3:  OT Short Term Goal 1 (Week 3): Pt will be able to complete squat pivot from bed to chair to prepare for LB dressing with mod A of 1. OT Short Term Goal 2 (Week 3): Pt will be able to sit to stand at sink or EOB with min A to prepare for LB dressing.  OT Short  Term Goal 3 (Week 3): From sitting EOB, pt will don RLE into pants and pull up to thigh level with min A. OT Short Term Goal 4 (Week 3): Pt will don pullover shirt with mod A.     Skilled Therapeutic Interventions/Progress Updates: Skilled OT session completed with focus on sitting balance, safe positioning of L UE, and standing tolerance/midline orientation in [place]. Pt was lying in bed at time of arrival, very verbal with broken words regarding doctor discussion about going home. Pt educated on team conference this week with no absolute d/c date in her chart. She nodded yes for understanding. Pt communicated desire to get dressed. Supine<sit completed with Mod A with adaptive technique for guiding L LE towards EOB. Pt completed sit<stand in [place] with Mod A, able to assist with guiding left arm through sleeve and donning right sleeve. Pt attempted one handed technique for buttoning but became frustrated when unsuccessful. Dynamic balance in Stedy fair during UB dressing, with occasional LOBs to left with therapist correction. Pt then expressed that she felt pain in L LE when standing after 4 minutes and wanted to  return to bed. Pt encouraged to return to w/c to don pants in upright position and educated on benefits, with pt still declining and requesting to don pants in bed. Therefore pt was transferred back to bed and donned pants there. She was able to lift pants over knees and thread right leg through pants. She also assisted with bridging but required Max A for lifting pants over hips and rolling R<L. Pt was then repositioned in bed with 2 helpers, L UE splint donned and L UE elevated. All needs within reach at time of departure. Extra time required for ADL task completion due to communication barriers.     Therapy Documentation Precautions:  Precautions Precautions: Fall Precaution Comments: helmet when OOB Restrictions Weight Bearing Restrictions: No General:   Vital Signs: Therapy  Vitals Temp: 97.8 F (36.6 C) Temp Source: Oral Pulse Rate: 60 Resp: 18 BP: (!) 100/50 Patient Position (if appropriate): Lying Oxygen Therapy SpO2: 99 % O2 Device: Not Delivered Pain: c/o L leg pain when standing, subsided when returned to sitting  Pain Assessment Faces Pain Scale: Hurts little more Pain Type: Acute pain Pain Location: Leg Pain Orientation: Left Pain Descriptors / Indicators: Aching Pain Intervention(s): Medication (See eMAR) ADL: ADL ADL Comments: refer to navigator     See Function Navigator for Current Functional Status.   Therapy/Group: Individual Therapy  Rozalyn Osland A Jaxsyn Azam 06/14/2016, 9:50 AM

## 2016-06-15 ENCOUNTER — Inpatient Hospital Stay (HOSPITAL_COMMUNITY): Payer: Managed Care, Other (non HMO) | Admitting: Speech Pathology

## 2016-06-15 ENCOUNTER — Inpatient Hospital Stay (HOSPITAL_COMMUNITY): Payer: Managed Care, Other (non HMO) | Admitting: Physical Therapy

## 2016-06-15 ENCOUNTER — Inpatient Hospital Stay (HOSPITAL_COMMUNITY): Payer: Managed Care, Other (non HMO) | Admitting: Occupational Therapy

## 2016-06-15 NOTE — Progress Notes (Signed)
Physical Therapy Session Note  Patient Details  Name: Cheryl ClinesLaura Gibbs MRN: [id number] Date of Birth: 01/16/1991  Today's Date: 06/15/2016 PT Individual Time: [id number] PT Individual Time Calculation (min): 82 min   Short Term Goals: Week 3:  PT Short Term Goal 1 (Week 3): Pt will consistently perform functional transfers with +1 max A PT Short Term Goal 2 (Week 3): Pt will propel manual w/c x 25' with min A PT Short Term Goal 3 (Week 3): Pt will maintain sitting balance for functional task with min A  Skilled Therapeutic Interventions/Progress Updates:    Pt received in w/c finishing lunch & agreeable to tx. Pt noted pain in LLE but RN reports pt is premedicated. Pt requested to finish eating lunch and did so with task focusing on attention to L and one instance of cuing for small bites. Provided pt with home measurement sheet with note requesting her mother complete paper (attached it to bulletin board in room). Educated pt on need to use w/c upon d/c for safety. Pt propelled w/c room>gym with R hemi technique and mod assist for obstacle avoidance on L. Pt with difficulty steering w/c with RLE and would benefit from trial of dycem on shoe or remove cushion from w/c for alternative positioning. Pt completed squat pivot w/c<>mat table with +2 assist to R and +1 assist to L with therapist providing blocking at L knee, facilitation for anterior weight shifting and pivoting. Educated pt on head/hips relationship to increase ease of transfers but pt would benefit from further education & practice. Pt sat on mat table without BLE or BUE support while reaching for cups outside of BOS with min assist to return to midline. Transitioned to task to allow pt to have BLE supported while therapist facilitated anterior pelvic tilt and shoulder retraction for improved posture. While doing so pt engaged in ball kicks with RLE and encouraged pt to use LLE. Therapist did not observe any muscle activation in LLE even when  facilitating movement. Pt reported she becomes discouraged when attempting tasks and her LUE/LE do not work. Educated pt on recovery process following this medical issue. Encouraged pt to attempt as many activities as possible and physiological benefits of doing so. Educated pt on lifestyle modifications following d/c and discussed goals to allow her to d/c home with mother. Pt becoming labile multiple times during session when focusing on children and current level of mobility; therapist provided emotional support. Pt motivated & requesting to walk; transported pt into hall and pt ambulated 15 ft with rail in hallway with therapist facilitating weight shifting, verbal cuing for sequencing, assistance to advance & place LLE, approximation at knee to prevent buckling and hyperextension, and assistance to maintain neutral ankle alignment. Pt motivated during task and demonstrated improved upright posture. Returned pt to room and provided pt with list of accomplishments therapist & pt created together. Pt made goals for therapy tomorrow and therapist encouraged pt to continue staying motivated during therapies. Pt left in w/c with QRB donned & all needs within reach.   Therapy Documentation Precautions:  Precautions Precautions: Fall Precaution Comments: helmet when OOB Restrictions Weight Bearing Restrictions: No   See Function Navigator for Current Functional Status.   Therapy/Group: Individual Therapy  Sandi MariscalVictoria M Tressy Kunzman 06/15/2016, 4:48 PM

## 2016-06-15 NOTE — Progress Notes (Signed)
[facility] PHYSICAL MEDICINE & REHABILITATION     PROGRESS NOTE    Subjective/Complaints: Lying in bed. No new complaints. Slept ok. Family offering patient food which is beyond her approved consistenty despite education otherwise  ROS: limited due to cognitive/mental status issues.  Objective: Vital Signs: Blood pressure 101/73, pulse (!) 58, temperature 98.2 F (36.8 C), temperature source Oral, resp. rate 18, height 5\' 4"  (1.626 m), weight 88.2 kg (194 lb 7.1 oz), last menstrual period 05/01/2016, SpO2 95 %. No results found. No results for input(s): WBC, HGB, HCT, PLT in the last 72 hours. No results for input(s): NA, K, CL, GLUCOSE, BUN, CREATININE, CALCIUM in the last 72 hours.  Invalid input(s): CO CBG (last 3)   Recent Labs  06/13/16 0743 06/13/16 1150  GLUCAP 96 83    Wt Readings from Last 3 Encounters:  06/10/16 88.2 kg (194 lb 7.1 oz)  05/20/16 84.4 kg (186 lb 1.6 oz)    Physical Exam:  Constitutional: She appears well-developed and well-nourished.  HENT: crani site clean,   Eyes: EOMI. No discharge.   Neck: Trach stoma healed    Cardiovascular: RRR Respiratory: clear bilaterally an unlabored  GI: Soft. Bowel sounds are normal. PEG stie clean.  Musculoskeletal: No edema. Better tolerance of PROM as a whole--still winces at times - Neurological: communicating through single words. Writes on [employer]  eye contact and engages somewhat.  Spastic left hemiparesis trace tone LUE and tr to 1/4 LLE.--unchanged  Moves RUE/RLE freely 5/5.  Skin: Skin is warm and dry.  Psychiatric: makes eye contact. Engages inconsistently  Assessment/Plan: 1. 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 Physiatrist is providing close team supervision and 24 hour management of active medical problems listed below. Physiatrist and rehab team  continue to assess barriers to discharge/monitor patient progress toward functional and medical goals.  Function:  Bathing Bathing position   Position: Wheelchair/chair at sink  Bathing parts Body parts bathed by patient: Left arm, Chest, Abdomen Body parts bathed by helper: Right arm, Front perineal area, Buttocks, Right upper leg, Left upper leg, Right lower leg, Left lower leg, Back  Bathing assist Assist Level: Touching or steadying assistance(Pt > 75%)      Upper Body Dressing/Undressing Upper body dressing   What is the patient wearing?: Button up shirt     Pull over shirt/dress - Perfomed by patient: Thread/unthread right sleeve, Put head through opening, Pull shirt over trunk Pull over shirt/dress - Perfomed by helper: Thread/unthread left sleeve Button up shirt - Perfomed by patient: Thread/unthread right sleeve Button up shirt - Perfomed by helper: Thread/unthread left sleeve, Pull shirt around back, Button/unbutton shirt    Upper body assist Assist Level:  (Max A)      Lower Body Dressing/Undressing Lower body dressing   What is the patient wearing?: Pants, Socks     Pants- Performed by patient: Thread/unthread right pants leg Pants- Performed by helper: Thread/unthread left pants leg, Pull pants up/down   Non-skid slipper socks- Performed by helper: Don/doff left sock   Socks - Performed by helper: Don/doff right sock, Don/doff left sock Shoes - Performed by patient: Don/doff right shoe, Don/doff left shoe Shoes - Performed by helper: Don/doff right shoe, Don/doff left shoe, Fasten right, Fasten left   AFO - Performed by helper: Don/doff left AFO      Lower body assist Assist for lower body dressing:  (  Max A)      Toileting Toileting Toileting activity did not occur: Safety/medical concerns   Toileting steps completed by helper: Adjust clothing prior to toileting, Performs perineal hygiene, Adjust clothing after toileting Toileting Assistive Devices: Grab  bar or rail, Other (comment) (stedy)  Toileting assist Assist level: Two helpers   Transfers Chair/bed transfer   Chair/bed transfer method: Lateral scoot Chair/bed transfer assist level: Moderate assist (Pt 50 - 74%/lift or lower) Chair/bed transfer assistive device: Sliding board Mechanical lift: Stedy   Locomotion Ambulation Ambulation activity did not occur: Safety/medical concerns   Max distance: 2 ft Assist level: 2 helpers (max assist & +2 for safety)   Wheelchair   Type: Manual Max wheelchair distance: 15 Assist Level: Touching or steadying assistance (Pt > 75%)  Cognition Comprehension Comprehension assist level: Understands basic 75 - 89% of the time/ requires cueing 10 - 24% of the time  Expression Expression assist level: Expresses basic 25 - 49% of the time/requires cueing 50 - 75% of the time. Uses single words/gestures.  Social Interaction Social Interaction assist level: Interacts appropriately 50 - 74% of the time - May be physically or verbally inappropriate.  Problem Solving Problem solving assist level: Solves basic 50 - 74% of the time/requires cueing 25 - 49% of the time  Memory Memory assist level: Recognizes or recalls 50 - 74% of the time/requires cueing 25 - 49% of the time     Medical Problem List and Plan: 1.  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  -cont CIR-PT, OT, SLP  -continue  helmet when OOB  -Dr. Lionel DecemberFargen, [facility]---cranioplasty in a few weeks--would like to sync with rehab discharge.   2.  DVT Prophylaxis/Anticoagulation: Mechanical: Sequential compression devices, below knee Bilateral lower extremities. LLE dopplers 12/26 negative for DVT 3. Headaches/Pain Management: controlled at present  -extremity pain still appears to be neuropathic  -increased gabapentin to 600mg  TID          -continue hydrocodone at 10mg  prn  -consider TCA at bedtime 4. Mood: LCSW to follow for  evaluation and support when appropriate.   -depression has been an issue  -increase zoloft to 100mg  daily (dose not increased initially)  -adderall 20mg  XR added for initiation/engagement (was also using at home)   5. Neuropsych: This patient is not capable of making decisions on her own behalf. 6. Skin/Wound Care: routine pressure relief measures.  7. Fluids/Electrolytes/Nutrition:  D1/nectars per SLP  -continue to push PO 8. VDRF:  trach stoma closed 9. New onset seizures: Continue Keppra bid.  10. Spasticity/Neuropathy LUE/LLE: see above  -baclofen at 10mg  TID, increased to 20 TID on 1/14--tolerating thus far  -LLE ROM, splinting   -reviewed stretching with patient today 11. Hypotension/bradycardia  Asymptomatic 1/14  Will cont to monitor .  LOS (Days) 26 A FACE TO FACE EVALUATION WAS PERFORMED  Ranelle OysterSWARTZ,ZACHARY T, MD 06/15/2016 9:21 AM

## 2016-06-15 NOTE — Progress Notes (Signed)
Occupational Therapy Session Note  Patient Details  Name: Cheryl Gibbs MRN: [id number] Date of Birth: 06-13-1990  Today's Date: 06/15/2016 OT Individual Time: 2458-0998 and 3382-5053 OT Individual Time Calculation (min): 58 min and 35 min   Short Term Goals: Week 3:  OT Short Term Goal 1 (Week 3): Pt will be able to complete squat pivot from bed to chair to prepare for LB dressing with mod A of 1. OT Short Term Goal 2 (Week 3): Pt will be able to sit to stand at sink or EOB with min A to prepare for LB dressing.  OT Short Term Goal 3 (Week 3): From sitting EOB, pt will don RLE into pants and pull up to thigh level with min A. OT Short Term Goal 4 (Week 3): Pt will don pullover shirt with mod A.  Skilled Therapeutic Interventions/Progress Updates:     Visit 1:  Pain:  No c/o pain (except in L arm with certain positions) Pt on bed pan at start of session and needed more time. Pt was not able to void so transferred to open seat tub bench placed over toilet using Stedy lift. Hips needed to be positioned to L to avoid hip pain. Pillow placed at left side for support.  Sat for a few minutes and not able to void. Pt requested to shower. Transferred to tub bench in shower with stedy using pillow for support and cues for pt to use RUE to correct her posture when leaning to the left.  Bathed self with mod A. Used Stedy to transfer back to w/c to dress. Continues to need total A with LB dressing, but is now min for UB.  Pt is now vocalizing much more and more clearly.  Pt adjusted in w/c with belt, lap tray and all needs met.     Visit 2: no c/o pain Pt seen this session to focus on transfers without the [facility]. Pt crying vocalizing " I am scared of falling". Reassured pt that she is ready to do more transfers without the [facility] and she has to start preparing her body for movements she will need to do at home. Practiced squat pivot transfers a total of 6x. 4 x to R and back and 2x to L and back.  She only  needed mod A to the R and mod-max to the L.  Pt did well with forward wt shift to elevate hips.  Pt taken back to room and set up with all needs met. She attempted communication with her white board with this therapist for over 15 minutes.  Pt continually wrote the same 5 words (we done this session now) and this therapist was having great difficulty understanding what she was writing.  Attempted confirming that yes the session was over, pt shaking head in frustration that I was not understanding her.  Finally explained to pt that I had to leave and to use the call light to contact RN if needed.    Therapy Documentation Precautions:  Precautions Precautions: Fall Precaution Comments: helmet when OOB Restrictions Weight Bearing Restrictions: No    Vital Signs: Therapy Vitals Temp: 98.2 F (36.8 C) Temp Source: Oral Pulse Rate: (!) 58 Resp: 18 BP: 101/73 Patient Position (if appropriate): Lying Oxygen Therapy SpO2: 95 % O2 Device: Not Delivered  ADL: ADL ADL Comments: refer to navigator  See Function Navigator for Current Functional Status.   Therapy/Group: Individual Therapy  Ilisha Blust 06/15/2016, 8:37 AM

## 2016-06-15 NOTE — Progress Notes (Signed)
Speech Language Pathology Daily Session Note  Patient Details  Name: Cheryl Gibbs MRN: [id number] Date of Birth: 05/13/1991  Today's Date: 06/15/2016 SLP Individual Time: 1015-1100 SLP Individual Time Calculation (min): 45 min  Short Term Goals: Week 4: SLP Short Term Goal 1 (Week 4): Patient will scan to left field of enviornment to locate specific objects, etc with Min A verbal and visual cues.  SLP Short Term Goal 2 (Week 4): Patient will demonstrate sustained attention to functional tasks for 10 minutes with Min A verbal cues.  SLP Short Term Goal 3 (Week 4): Patient will demonstrate basic problem solving with Min A verbal cues.  SLP Short Term Goal 4 (Week 4): Patient will self-monitor and correct errors during written expression with  Min A multimodal cues.  SLP Short Term Goal 5 (Week 4): Patient will verbalize at the word level with 50% intelligibility and Min A verbal cues.  SLP Short Term Goal 6 (Week 4): Patient will consume current diet with minimal overt s/s of aspiration and supervision verbal cues for use of swallowing compensatory strategies   Skilled Therapeutic Interventions: Skilled treatment session focused on dysphagia and speech goals. SLP facilitated session by providing set-up assist for oral care via the suction toothbrush. Patient consumed trials of thin liquids via cup without overt s/s of aspiration but demonstrated a suspected delayed swallow initiation and intermittent use of multiple swallows. Recommend patient initiate the water protocol. SLP also facilitated session by educated the patient's family via the telephone of patient's current swallowing function, diet recommendation, appropriate textures and barriers to diet upgrade.  She verbalized understanding and was agreeable to bringing a meal of Dys. 2 textures for a trial with SLP observation tomorrow. Patient communicated her wants/needs with extra time and Mod A verbal cues for patient to self-monitor and correct  verbal and written errors. Patient left upright in wheelchair with all needs within reach. Continue with current plan of care.    Function:  Eating Eating   Modified Consistency Diet: Yes Eating Assist Level: More than reasonable amount of time;Set up assist for;Supervision or verbal cues   Eating Set Up Assist For: Opening containers       Cognition Comprehension Comprehension assist level: Understands basic 75 - 89% of the time/ requires cueing 10 - 24% of the time  Expression Expression assistive device: Communication board Expression assist level: Expresses basic 50 - 74% of the time/requires cueing 25 - 49% of the time. Needs to repeat parts of sentences.  Social Interaction Social Interaction assist level: Interacts appropriately 50 - 74% of the time - May be physically or verbally inappropriate.  Problem Solving Problem solving assist level: Solves basic 50 - 74% of the time/requires cueing 25 - 49% of the time  Memory Memory assist level: Recognizes or recalls 50 - 74% of the time/requires cueing 25 - 49% of the time    Pain No/Denies Pain   Therapy/Group: Individual Therapy   Dillen Belmontes 06/15/2016, 11:08 AM

## 2016-06-15 NOTE — Plan of Care (Signed)
Problem: RH KNOWLEDGE DEFICIT BRAIN INJURY Goal: RH STG INCREASE KNOWLEDGE OF DYSPHAGIA/FLUID INTAKE Outcome: Progressing Patient's mother verbalized understanding of dysphasia diet and precautions swallowing

## 2016-06-16 ENCOUNTER — Inpatient Hospital Stay (HOSPITAL_COMMUNITY): Payer: Managed Care, Other (non HMO) | Admitting: Occupational Therapy

## 2016-06-16 ENCOUNTER — Inpatient Hospital Stay (HOSPITAL_COMMUNITY): Payer: Managed Care, Other (non HMO) | Admitting: Speech Pathology

## 2016-06-16 ENCOUNTER — Inpatient Hospital Stay (HOSPITAL_COMMUNITY): Payer: Managed Care, Other (non HMO) | Admitting: Physical Therapy

## 2016-06-16 NOTE — Progress Notes (Signed)
Physical Therapy Note  Patient Details  Name: Ned ClinesLaura Nickless MRN: [id number] Date of Birth: 01/29/1991 Today's Date: 06/16/2016    Time: 730-825 55 minutes  1:1 Pt with no c/o pain, improved tolerance to touch of Lt UE/LE.  Pt able to sit edge of bed with supervision/min A x 10 minutes for donning shoes and helmet.  Pt performed transfers to Rt and Lt with max +1 assist.  Gait with PFRW 3 x 10' with max A, +2 for safety. Pt requires max manual facilitation for balance, wt shift, Lt LE stance control and total A to advance Lt LE. Pt encouraged by ability to walk with PFRW today. Pt continues to improve verbal communication but still relies on white board >50% of time.    Karren Newland 06/16/2016, 8:24 AM

## 2016-06-16 NOTE — Progress Notes (Signed)
Physical Therapy Session Note  Patient Details  Name: Ned ClinesLaura Hagerty MRN: [id number] Date of Birth: 10/29/1990  Today's Date: 06/16/2016 PT Individual Time: 1305-1401 PT Individual Time Calculation (min): 56 min   Short Term Goals: Week 4:  PT Short Term Goal 1 (Week 4): Pt will demo static sitting balance for functional task x 3 minutes with supervision PT Short Term Goal 2 (Week 4): Pt will propel w/c with min A 25' in controlled environment PT Short Term Goal 3 (Week 4): Pt will perform gait with max A x 10' in controlled environment  Skilled Therapeutic Interventions/Progress Updates:  Pt received in bed & agreeable to tx, noting 4/10 pain in LLE -- RN made aware & meds administered. Session focused on bed mobility, transfers, w/c mobility, neuro re-ed, and L inattention. Pt requires assistance with management of LUE/LE for rolling and transferring supine>sitting but pt with improved core strength and only required mod assist to transfer to sitting. Pt reported need to use bathroom and completed stand pivot bed<>BSC with max assist with assistance for weight shifting and cuing for sequencing. Pt requires total assist for management of clothing; pt unable to void or have BM [facility] Pt transferred bed>w/c with max assist +1 for squat pivot with max cuing for technique & sequencing. Pt propelled w/c room<>BI gym with R hemi technique (pt with improved positioning with cushion removed and improved ability to use RLE). Utilized dynavision in standing with max assist for sit<>stand and for standing balance; therapist provided blocking at L knee and pt utilized RUE. Pt with some L knee hyperextension noted during task.  Activity focused on weight bearing through LLE for neuro re-ed, balance, endurance training, and L attention. Pt tolerated standing 1 minute + 2 minutes + 2 minutes. At end of session pt left sitting in w/c in room with QRB donned & all needs within reach.  Pt  continues to be motivated to participate today, with therapist documenting accomplishments & goals on paper in room.      Therapy Documentation Precautions:  Precautions Precautions: Fall Precaution Comments: helmet when OOB Restrictions Weight Bearing Restrictions: No   See Function Navigator for Current Functional Status.   Therapy/Group: Individual Therapy  Sandi MariscalVictoria M Miller 06/16/2016, 5:39 PM

## 2016-06-16 NOTE — Progress Notes (Signed)
[facility]ICINE [facility] RE[facility]ABILITATION     PROGRESS NOTE    Subjective/Complaints: Tearful this morning. No apparent new issues. Walked with PT   ROS: Unable to obtain due to cognitive/mental status issues.  Objective: Vital Signs: Blood pressure 101/61, pulse 60, temperature 97.6 F (36.4 C), temperature source Oral, resp. rate 18, height 5\' 4"  (1.626 m), weight 88.2 kg (194 lb 7.1 oz), SpO2 100 %. No results found. No results for input(s): WBC, HGB, HCT, PLT in the last 72 hours. No results for input(s): NA, K, CL, GLUCOSE, BUN, CREATININE, CALCIUM in the last 72 hours.  Invalid input(s): CO CBG (last 3)   Recent Labs  06/13/16 1150  GLUCAP 83    Wt Readings from Last 3 Encounters:  06/10/16 88.2 kg (194 lb 7.1 oz)  05/20/16 84.4 kg (186 lb 1.6 oz)    Physical Exam:  Constitutional: She appears well-developed and well-nourished.  HENT: crani site clean,   Eyes: EOMI. No discharge.   Neck: Trach stoma healed    Cardiovascular: RRR Respiratory: clear  GI: Soft. Bowel sounds are normal. PEG stie clean.  Musculoskeletal: No edema. Better tolerance of PROM as a whole--still winces at times - Neurological: communicating through single words. Writes on [employer]whiteboard Makes  eye contact and engages somewhat.  Spastic left hemiparesis trace tone LUE and tr to 1/4 LLE.--stable Moves RUE/RLE freely 5/5.  Skin: Skin is warm and dry.  Psychiatric: makes eye contact more consistently. engages  Assessment/Plan: 1. 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 Physiatrist is providing close team supervision and 24 hour management of active medical problems listed below. Physiatrist and rehab team continue to assess barriers to discharge/monitor patient progress toward functional and medical goals.  Function:  Bathing Bathing position    Position: Shower  Bathing parts Body parts bathed by patient: Left arm, Chest, Abdomen, Front perineal area, Left upper leg Body parts bathed by helper: Buttocks, Right lower leg, Left lower leg, Back, Right arm  Bathing assist Assist Level: Touching or steadying assistance(Pt > 75%)      Upper Body Dressing/Undressing Upper body dressing   What is the patient wearing?: Pull over shirt/dress     Pull over shirt/dress - Perfomed by patient: Thread/unthread right sleeve, Put head through opening, Pull shirt over trunk Pull over shirt/dress - Perfomed by helper: Thread/unthread left sleeve Button up shirt - Perfomed by patient: Thread/unthread right sleeve Button up shirt - Perfomed by helper: Thread/unthread left sleeve, Pull shirt around back, Button/unbutton shirt    Upper body assist Assist Level:  (Max A)      Lower Body Dressing/Undressing Lower body dressing   What is the patient wearing?: Pants, Socks, Shoes     Pants- Performed by patient: Thread/unthread right pants leg Pants- Performed by helper: Thread/unthread left pants leg, Pull pants up/down, Thread/unthread right pants leg   Non-skid slipper socks- Performed by helper: Don/doff left sock   Socks - Performed by helper: Don/doff right sock, Don/doff left sock Shoes - Performed by patient: Don/doff right shoe, Don/doff left shoe Shoes - Performed by helper: Don/doff right shoe, Don/doff left shoe, Fasten right, Fasten left   AFO - Performed by helper: Don/doff left AFO      Lower body assist Assist for lower body dressing:  (Max A)      Toileting Toileting Toileting activity did not occur: Safety/medical concerns   Toileting  steps completed by helper: Adjust clothing prior to toileting, Performs perineal hygiene, Adjust clothing after toileting Toileting Assistive Devices: Grab bar or rail  Toileting assist Assist level: Two helpers   Transfers Chair/bed transfer   Chair/bed transfer method: Squat  pivot Chair/bed transfer assist level: 2 helpers Chair/bed transfer assistive device: Armrests Mechanical lift: Landscape architect Ambulation activity did not occur: Safety/medical concerns   Max distance: 15 ft Assist level: 2 helpers (max assist + w/c follow for safety)   Wheelchair   Type: Manual Max wheelchair distance: 100 ft Assist Level: Moderate assistance (Pt 50 - 74%)  Cognition Comprehension Comprehension assist level: Understands basic 75 - 89% of the time/ requires cueing 10 - 24% of the time  Expression Expression assist level: Expresses basic 50 - 74% of the time/requires cueing 25 - 49% of the time. Needs to repeat parts of sentences.  Social Interaction Social Interaction assist level: Interacts appropriately 50 - 74% of the time - May be physically or verbally inappropriate.  Problem Solving Problem solving assist level: Solves basic 50 - 74% of the time/requires cueing 25 - 49% of the time  Memory Memory assist level: Recognizes or recalls 50 - 74% of the time/requires cueing 25 - 49% of the time     Medical Problem List and Plan: 1.  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  -cont CIR-PT, OT, SLP  -continue  helmet when OOB  -Dr. Lionel December, [facility]---cranioplasty in a few weeks--would like to sync with rehab discharge.   2.  DVT Prophylaxis/Anticoagulation: Mechanical: Sequential compression devices, below knee Bilateral lower extremities. LLE dopplers 12/26 negative for DVT 3. Headaches/Pain Management: controlled at present  -extremity pain still appears to be neuropathic  -maintain gabapentin at 600mg  TID          -continue hydrocodone at 10mg  prn  -consider TCA at bedtime 4. Mood: LCSW to follow for evaluation and support when appropriate.   -depression has been an issue  -zoloft at 100mg  daily    -adderall 20mg  XR added for initiation/engagement (was also using at home)   5.  Neuropsych: This patient is not capable of making decisions on her own behalf. 6. Skin/Wound Care: routine pressure relief measures.  7. Fluids/Electrolytes/Nutrition:  D1/nectars per SLP  -continue to push PO 8. VDRF:  trach stoma closed 9. New onset seizures: Continue Keppra bid.  10. Spasticity/Neuropathy LUE/LLE: see above  -baclofen at 10mg  TID, increased to 20 TID on 1/14--tolerating thus far  -LLE ROM, splinting     11. Hypotension/bradycardia  Asymptomatic 1/14  Will cont to monitor .  LOS (Days) 27 A FACE TO FACE EVALUATION WAS PERFORMED  Faith Rogue T, MD 06/16/2016 9:30 AM

## 2016-06-16 NOTE — Progress Notes (Signed)
Physical Therapy Weekly Progress Note  Patient Details  Name: Cheryl Gibbs MRN: [id number] Date of Birth: 01-08-1991  Beginning of progress report period: June 09, 2016 End of progress report period: June 16, 2016  Patient has met 2 of 3 short term goals.  Pt improving sitting balance and is requiring only +1 assist for transfers. Pt able to begin gait training with [place]. Pt continues to require mod A with w/c mobility due to difficulty negotiating obstacles. Pt improving verbal communication and with improving motivation to participate.  Patient continues to demonstrate the following deficits muscle weakness, impaired timing and sequencing, abnormal tone, decreased coordination and decreased motor planning, decreased attention, decreased awareness, decreased problem solving, decreased safety awareness, decreased memory and delayed processing and decreased sitting balance, decreased standing balance, decreased postural control, hemiplegia and decreased balance strategies and therefore will continue to benefit from skilled PT intervention to increase functional independence with mobility.  Patient progressing toward long term goals..  Continue plan of care.  PT Short Term Goals Week 3:  PT Short Term Goal 1 (Week 3): Pt will consistently perform functional transfers with +1 max A PT Short Term Goal 1 - Progress (Week 3): Met PT Short Term Goal 2 (Week 3): Pt will propel manual w/c x 25' with min A PT Short Term Goal 2 - Progress (Week 3): Progressing toward goal PT Short Term Goal 3 (Week 3): Pt will maintain sitting balance for functional task with min A PT Short Term Goal 3 - Progress (Week 3): Met Week 4:  PT Short Term Goal 1 (Week 4): Pt will demo static sitting balance for functional task x 3 minutes with supervision PT Short Term Goal 2 (Week 4): Pt will propel w/c with min A 25' in controlled environment PT Short Term Goal 3 (Week 4): Pt will perform gait with max A x 10' in  controlled environment  Skilled Therapeutic Interventions/Progress Updates:  Ambulation/gait training;Cognitive remediation/compensation;Discharge planning;DME/adaptive equipment instruction;Functional mobility training;Pain management;Splinting/orthotics;Therapeutic Activities;UE/LE Strength taining/ROM;Wheelchair propulsion/positioning;UE/LE Coordination activities;Therapeutic Exercise;Stair training;Patient/family education;Neuromuscular re-education;Functional electrical stimulation;Community reintegration;Balance/vestibular training    See Function Navigator for Current Functional Status.   DONAWERTH,KAREN 06/16/2016, 8:27 AM

## 2016-06-16 NOTE — Progress Notes (Signed)
Speech Language Pathology Daily Session Note  Patient Details  Name: Cheryl Gibbs MRN: [id number] Date of Birth: 06/26/1990  Today's Date: 06/16/2016 SLP Individual Time: 1324-40101130-1215 SLP Individual Time Calculation (min): 45 min  Short Term Goals: Week 4: SLP Short Term Goal 1 (Week 4): Patient will scan to left field of enviornment to locate specific objects, etc with Min A verbal and visual cues.  SLP Short Term Goal 2 (Week 4): Patient will demonstrate sustained attention to functional tasks for 10 minutes with Min A verbal cues.  SLP Short Term Goal 3 (Week 4): Patient will demonstrate basic problem solving with Min A verbal cues.  SLP Short Term Goal 4 (Week 4): Patient will self-monitor and correct errors during written expression with  Min A multimodal cues.  SLP Short Term Goal 5 (Week 4): Patient will verbalize at the word level with 50% intelligibility and Min A verbal cues.  SLP Short Term Goal 6 (Week 4): Patient will consume current diet with minimal overt s/s of aspiration and supervision verbal cues for use of swallowing compensatory strategies   Skilled Therapeutic Interventions: Skilled treatment session focused on dysphagia goals and family education. Patient consumed lunch meal of Dys. 2 textures (brought from home) and demonstrated efficient mastication with minimal oral residue without overt s/s of aspiration. Therefore, recommend patient upgrade to Dys. 2 textures. SLP provided education to the patient and her mother in regards to her current swallowing function, diet recommendations, appropriate textures and compensatory strategies. Patient's mother verbalized understanding but will need reinforcement. Patient left upright in bed with mother present. Continue with current plan of care.      Function:  Eating Eating   Modified Consistency Diet: Yes Eating Assist Level: More than reasonable amount of time;Set up assist for;Supervision or verbal cues   Eating Set Up Assist  For: Opening containers       Cognition Comprehension Comprehension assist level: Understands basic 75 - 89% of the time/ requires cueing 10 - 24% of the time  Expression Expression assistive device: Communication board Expression assist level: Expresses basic 50 - 74% of the time/requires cueing 25 - 49% of the time. Needs to repeat parts of sentences.  Social Interaction Social Interaction assist level: Interacts appropriately 50 - 74% of the time - May be physically or verbally inappropriate.  Problem Solving Problem solving assist level: Solves basic 50 - 74% of the time/requires cueing 25 - 49% of the time  Memory Memory assist level: Recognizes or recalls 50 - 74% of the time/requires cueing 25 - 49% of the time    Pain No/Denies Pain   Therapy/Group: Individual Therapy  Hercules Hasler 06/16/2016, 3:16 PM

## 2016-06-16 NOTE — Progress Notes (Signed)
NT Dahlia ClientHannah reported patient noted crying in room. Emotional support provided. Patient reports she was writing a letter to her husband and feels she will never get better. RN provided emotional support to patient and patient reported feeling better. Continue with plan of care.  Cleotilde NeerJoyce, Illyana Schorsch S

## 2016-06-17 ENCOUNTER — Inpatient Hospital Stay (HOSPITAL_COMMUNITY): Payer: Managed Care, Other (non HMO)

## 2016-06-17 ENCOUNTER — Inpatient Hospital Stay (HOSPITAL_COMMUNITY): Payer: Managed Care, Other (non HMO) | Admitting: Physical Therapy

## 2016-06-17 ENCOUNTER — Inpatient Hospital Stay (HOSPITAL_COMMUNITY): Payer: Managed Care, Other (non HMO) | Admitting: Speech Pathology

## 2016-06-17 ENCOUNTER — Inpatient Hospital Stay (HOSPITAL_COMMUNITY): Payer: Managed Care, Other (non HMO) | Admitting: Occupational Therapy

## 2016-06-17 LAB — URINALYSIS, ROUTINE W REFLEX MICROSCOPIC
Bilirubin Urine: NEGATIVE
Glucose, UA: NEGATIVE mg/dL
HGB URINE DIPSTICK: NEGATIVE
Ketones, ur: NEGATIVE mg/dL
NITRITE: NEGATIVE
Protein, ur: NEGATIVE mg/dL
SPECIFIC GRAVITY, URINE: 1.012 (ref 1.005–1.030)
pH: 5 (ref 5.0–8.0)

## 2016-06-17 LAB — BASIC METABOLIC PANEL
Anion gap: 12 (ref 5–15)
BUN: 7 mg/dL (ref 6–20)
CHLORIDE: 104 mmol/L (ref 101–111)
CO2: 27 mmol/L (ref 22–32)
Calcium: 9.4 mg/dL (ref 8.9–10.3)
Creatinine, Ser: 0.69 mg/dL (ref 0.44–1.00)
GFR calc non Af Amer: >60 mL/min (ref >60)
Glucose, Bld: 101 mg/dL — ABNORMAL HIGH (ref 65–99)
POTASSIUM: 3.7 mmol/L (ref 3.5–5.1)
SODIUM: 143 mmol/L (ref 135–145)

## 2016-06-17 MED ORDER — FREE WATER
200.0000 mL | Freq: Three times a day (TID) | Status: DC
  Administered 2016-06-17 – 2016-07-01 (×53): 200 mL

## 2016-06-17 NOTE — Patient Care Conference (Signed)
Inpatient RehabilitationTeam Conference and Plan of Care Update Date: 06/16/2016   Time: 2:30 PM    Patient Name: Cheryl Gibbs      Medical Record Number: [id number]  Date of Birth: 06/22/1990 Sex: Female         Room/Bed: 4W16C/4W16C-01 Payor Info: Payor: Monia PouchAETNA / Plan: Derrek GuAETNA MANAGED / Product Type: *No Product type* /    Admitting Diagnosis: Postpartum  Admit Date/Time:  05/20/2016  5:38 PM Admission Comments: No comment available   Primary Diagnosis:  Hemorrhage, intracranial (HCC) Principal Problem: Hemorrhage, intracranial (HCC)  Patient Active Problem List   Diagnosis Date Noted  . Adjustment disorder with depressed mood   . Hypotension   . Bradycardia   . Hemorrhage, intracranial (HCC) 05/20/2016  . Global aphasia   . Spastic hemiparesis affecting nondominant side (HCC)   . Vascular headache   . Dysphagia, post-stroke   . Tracheostomy status (HCC)   . Seizure (HCC)   . Neuropathy (HCC)   . Nonverbal     Expected Discharge Date: Expected Discharge Date: 06/30/16  Team Members Present: Physician leading conference: Dr. Faith RogueZachary Swartz Social Worker Present: Amada JupiterLucy Sacred Roa, LCSW Nurse Present: Chana Bodeeborah Sharp, RN PT Present: Aleda GranaVictoria Miller, PT;Other (comment) Judieth Keens(Karen Donaworth, PT) OT Present: Ardis Rowanom [name], COTA;Jennifer Katrinka BlazingSmith, OT SLP Present: Feliberto Gottronourtney Payne, SLP PPS Coordinator present : Tora DuckMarie Noel, RN, CRRN     Current Status/Progress Goal Weekly Team Focus  Medical   improving engagement. tolerating movement of left sid emore. cranioplasty soon?  improved engagement and exercise tolerance  mood mgt, pain control, advancing diet   Bowel/Bladder   Continent of bowel, last BM 1/22. Can be incontinent of bladder.   Monitor for bowel and bladder function.  Assess bowel and bladder function and monitor for changes.   Swallow/Nutrition/ Hydration   Dys. 2 textures with nectar-thick liquids, Min A   Min A with least restrictive diet  tolerance of current diet, repeat MBS     ADL's   increased progress this week. She has improved a great deal with sitting balance of close S with static sit, min A to pull to stand, mod A squat pivot transfers, She can now tolerate sitting on the tub bench or bedside commode.   mod A standing balance, tub transfer, toilet transfer, LB dressing; min A bathing and UB dressing; max A toileting  ADL retraining, balance, trunk control, LUE PROM, activity tolerance, cognitive activities, pt/family education   Mobility   +2 for transfers for safety, mod assist w/c mobility  min assist overall   pt education, neuro re-ed, balance, transfers, bed mobility, w/c mobility   Communication   Overall Mod A  Mod I for multimodal communication  verbal expression of wants/needs, functional communication    Safety/Cognition/ Behavioral Observations  Mod A  Min A  attention, problem solving, awareness    Pain   Denies pain this shift.  Pain less than or equal to 2.  Medicate for pain prn.   Skin   peg site clean dry and intact.  No skin breakdown while in rehab.  Monitor skin q shift and prn.    Rehab Goals Patient on target to meet rehab goals: Yes *See Care Plan and progress notes for long and short-term goals.  Barriers to Discharge: see prior, poor caregiver awareness of needs    Possible Resolutions to Barriers:  see prior, continued modulation of medications and adaptive devices    Discharge Planning/Teaching Needs:  Plan to d/c home to mother's house with spouse, mother  and other family providing 24/7 assistance.  Mother here today to begin education.   Team Discussion:  Still a mod/max to stand and with transfers.  Sensitivity much improved and more tolerable for pt.  Upgraded diet to D2 and hope to upgrade liquids soon.  ?flap surgery soon at [facility] - MD/PA to follow up.  SW expressing concern that mother still not commiting to a d/c plan and will follow up with her this afternoon.  Concern may need to change d/c plan to SNF which  could effect timing of flap surgery.  Revisions to Treatment Plan:  Diet upgrade;  Possible change in d/c plan   Continued Need for Acute Rehabilitation Level of Care: The patient requires daily medical management by a physician with specialized training in physical medicine and rehabilitation for the following conditions: Daily direction of a multidisciplinary physical rehabilitation program to ensure safe treatment while eliciting the highest outcome that is of practical value to the patient.: Yes Daily medical management of patient stability for increased activity during participation in an intensive rehabilitation regime.: Yes Daily analysis of laboratory values and/or radiology reports with any subsequent need for medication adjustment of medical intervention for : Post surgical problems;Neurological problems;Mood/behavior problems  Ajwa Kimberley 06/17/2016, 11:34 AM

## 2016-06-17 NOTE — Progress Notes (Signed)
Physical Therapy Session Note  Patient Details  Name: Ned ClinesLaura Blanke MRN: [id number] Date of Birth: 09/26/1990  Today's Date: 06/17/2016 PT Individual Time: 0805-0900 and [id number] PT Individual Time Calculation (min): 55 min and 29 min  Short Term Goals: Week 4:  PT Short Term Goal 1 (Week 4): Pt will demo static sitting balance for functional task x 3 minutes with supervision PT Short Term Goal 2 (Week 4): Pt will propel w/c with min A 25' in controlled environment PT Short Term Goal 3 (Week 4): Pt will perform gait with max A x 10' in controlled environment  Skilled Therapeutic Interventions/Progress Updates:  Treatment 1: Pt received in bed & agreeable to tx, denying c/o pain. Session focused on transfers, LLE neuromuscular re-education during gait training, and w/c mobility. Pt required mod assist for supine>sitting but with improved ability to pull to sitting with RUE. Pt requested to don brief as she fears incontinence when performing strenuous exercises during therapy. Pt transferred sit<>stand with max assist and required mod assist for standing balance with therapist blocking LLE while rehab tech donned brief total assist. Pt completed squat pivot bed>w/c with max assist, and multimodal cuing and assistance for weight shifting; pt would benefit from further education and practice with head/hips relationship. Pt propelled w/c room<>gym with R hemi technique and close supervision with frequent cuing for attention to L and obstacle avoidance. Gait training x 30 ft with rail + 30 ft with hemiwalker with +2 assist (max assist + w/c follow for safety). Pt with improved upright posture and tolerance for weight bearing through LLE. Therapist provided manual facilitation for weight shifting L, advancement & placement of LLE, approximation and blocking at L knee to prevent buckling & hyperextension. Pt requires cuing for forward gaze as well. At end of session pt reported need to use restroom & completed  stand pivot w/c>BSC with max assist and total assist for clothing management. Pt left on BSC in care of RN.  Treatment 2:  Pt received on toilet with RN present. Pt transferred sit<>stand and stand pivot toilet>w/c with mod/max assist. RN provided total assist for peri hygiene and clothing management. Transported pt to gym via w/c total assist for time management. Pt completed stand pivot w/c<>cybex kinetron with hemiwalker and max assist with therapist providing manual facilitation for weight shifting, LLE advancement & placement, and max cuing for sequencing. Pt utilized cybex kinetron in sitting with tactile cuing for LE strengthening and LLE neuro re-ed. No muscle contraction noted in LLE. Educated pt on purpose of activity and pt receptive of information. At end of session pt left sitting in w/c in room with QRB donned & all needs within reach.      Therapy Documentation Precautions:  Precautions Precautions: Fall Precaution Comments: helmet when OOB Restrictions Weight Bearing Restrictions: Yes    See Function Navigator for Current Functional Status.   Therapy/Group: Individual Therapy  Sandi MariscalVictoria M Rayetta Veith 06/17/2016, 3:02 PM

## 2016-06-17 NOTE — Progress Notes (Signed)
Patient continues to refuse oral intake of medications. Patient states of having trouble as a child taking medications and does not want to try. Patient also states of having trouble swallowing and feeling like choking with oral intake. Patient upset and aggravated when nurse asks questions about oral intake such as food and liquid. Patient wrote on whiteboard stating it's easier to get medicines through PEG. AM medication administered via PEG.

## 2016-06-17 NOTE — Progress Notes (Signed)
Nurse encouraging patient to attempt and try oral medications. Patient upset and refuses to take medications by mouth. Nurse educated patient on importance of trying to advance to oral medications. Patient continues to be upset and pulls PEG tube out from underneath her shirt. Patient points at tube and insists for HS meds to be administered via PEG. Nurse administers medications via PEG.

## 2016-06-17 NOTE — Progress Notes (Signed)
[facility] PHYSICAL MEDICINE & REHABILITATION     PROGRESS NOTE    Subjective/Complaints: In good spirits this am. Getting started with therapies. No new complaints. Pain seems better on left. "home?"  ROS: Limited due cognitive/behavioral   Objective: Vital Signs: Blood pressure 103/63, pulse 63, temperature 97.5 F (36.4 C), temperature source Oral, resp. rate 17, height 5\' 4"  (1.626 m), weight 81.1 kg (178 lb 14.4 oz), SpO2 99 %. Dg Swallowing Func-speech Pathology  Result Date: 06/17/2016 Objective Swallowing Evaluation: Type of Study: MBS-Modified Barium Swallow Study Patient Details Name: Cheryl Gibbs MRN: [id number] Date of Birth: 02-12-1991 Today's Date: 06/17/2016 Time: 4098-1191 Individual Time: 30 minutes Past Medical History: Past Medical History: Diagnosis Date . ADD (attention deficit disorder)  . Occipital neuralgia  Past Surgical History: Past Surgical History: Procedure Laterality Date . CESAREAN SECTION  03/31/2016 HPI: see H&P No Data Recorded Assessment / Plan / Recommendation CHL IP CLINICAL IMPRESSIONS 06/17/2016 Therapy Diagnosis Moderate oral phase dysphagia;Moderate pharyngeal phase dysphagia Clinical Impression Patient demonstrates a moderate oral  and pharyngeal sensory based dysphagia. Oral phase is characterized by impaired mastication of solid textures leading to left anterior spillage of solids and liquids and mildly prolonged AP transit with all trials. Pharyngeal phase is characterized by delayed swallow initiation and impaired timing/coordination of swallow resulting in silent  penetration and aspiration of thin liquids. Compensatory strategies attempted but were unsuccessful due to motor planning impairments. Recommend patient continue Dys. 2 textures with nectar-thick liquids with full supervision to maximize safety. Discussed results with patient, she verbalized understanding.  Impact on safety and function Moderate aspiration risk   CHL IP TREATMENT RECOMMENDATION  06/17/2016 Treatment Recommendations Therapy as outlined in treatment plan below   Prognosis 06/17/2016 Prognosis for Safe Diet Advancement Fair Barriers to Reach Goals Cognitive deficits Barriers/Prognosis Comment -- CHL IP DIET RECOMMENDATION 06/17/2016 SLP Diet Recommendations Nectar thick liquid;Dysphagia 2 (Fine chop) solids Liquid Administration via Cup;No straw Medication Administration Crushed with puree Compensations Minimize environmental distractions;Slow rate;Small sips/bites;Monitor for anterior loss Postural Changes Remain semi-upright after after feeds/meals (Comment)   CHL IP OTHER RECOMMENDATIONS 06/17/2016 Recommended Consults -- Oral Care Recommendations Oral care BID Other Recommendations --   CHL IP FOLLOW UP RECOMMENDATIONS 06/17/2016 Follow up Recommendations Home health SLP;24 hour supervision/assistance   CHL IP FREQUENCY AND DURATION 06/17/2016 Speech Therapy Frequency (ACUTE ONLY) min 5x/week Treatment Duration 3 weeks      CHL IP ORAL PHASE 06/17/2016 Oral Phase Impaired Oral - Pudding Teaspoon -- Oral - Pudding Cup -- Oral - Honey Teaspoon -- Oral - Honey Cup -- Oral - Nectar Teaspoon -- Oral - Nectar Cup Left anterior bolus loss Oral - Nectar Straw -- Oral - Thin Teaspoon Left anterior bolus loss Oral - Thin Cup Left anterior bolus loss Oral - Thin Straw Left anterior bolus loss Oral - Puree Left anterior bolus loss Oral - Mech Soft Left anterior bolus loss;Impaired mastication;Delayed oral transit Oral - Regular NT Oral - Multi-Consistency -- Oral - Pill -- Oral Phase - Comment --  CHL IP PHARYNGEAL PHASE 06/17/2016 Pharyngeal Phase Impaired Pharyngeal- Pudding Teaspoon -- Pharyngeal -- Pharyngeal- Pudding Cup -- Pharyngeal -- Pharyngeal- Honey Teaspoon -- Pharyngeal -- Pharyngeal- Honey Cup -- Pharyngeal -- Pharyngeal- Nectar Teaspoon -- Pharyngeal -- Pharyngeal- Nectar Cup Delayed swallow initiation-vallecula Pharyngeal -- Pharyngeal- Nectar Straw -- Pharyngeal -- Pharyngeal- Thin Teaspoon  Delayed swallow initiation-pyriform sinuses;Penetration/Aspiration during swallow Pharyngeal Material enters airway, passes BELOW cords without attempt by patient to eject out (silent aspiration) Pharyngeal- Thin Cup Delayed  swallow initiation-pyriform sinuses;Compensatory strategies attempted (with notebox);Penetration/Aspiration during swallow Pharyngeal Material enters airway, passes BELOW cords without attempt by patient to eject out (silent aspiration);Material enters airway, remains ABOVE vocal cords and not ejected out Pharyngeal- Thin Straw Delayed swallow initiation-pyriform sinuses;Penetration/Aspiration during swallow Pharyngeal Material enters airway, passes BELOW cords and not ejected out despite cough attempt by patient Pharyngeal- Puree -- Pharyngeal -- Pharyngeal- Mechanical Soft Delayed swallow initiation-vallecula Pharyngeal -- Pharyngeal- Regular NT Pharyngeal -- Pharyngeal- Multi-consistency -- Pharyngeal -- Pharyngeal- Pill -- Pharyngeal -- Pharyngeal Comment --  CHL IP CERVICAL ESOPHAGEAL PHASE 06/17/2016 Cervical Esophageal Phase WFL Pudding Teaspoon -- Pudding Cup -- Honey Teaspoon -- Honey Cup -- Nectar Teaspoon -- Nectar Cup -- Nectar Straw -- Thin Teaspoon -- Thin Cup -- Thin Straw -- Puree -- Mechanical Soft -- Regular -- Multi-consistency -- Pill -- Cervical Esophageal Comment -- No flowsheet data found. PAYNE, COURTNEY 06/17/2016, 3:14 PM              No results for input(s): WBC, HGB, HCT, PLT in the last 72 hours.  Recent Labs  06/17/16 1020  NA 143  K 3.7  CL 104  GLUCOSE 101*  BUN 7  CREATININE 0.69  CALCIUM 9.4   CBG (last 3)  No results for input(s): GLUCAP in the last 72 hours.  Wt Readings from Last 3 Encounters:  06/17/16 81.1 kg (178 lb 14.4 oz)  05/20/16 84.4 kg (186 lb 1.6 oz)    Physical Exam:  Constitutional: She appears well-developed and well-nourished.  HENT: crani site clean,   Eyes: EOMI. No discharge.   Neck: Trach stoma healed     Cardiovascular:RRR Respiratory: CTA B  GI: Soft. Bowel sounds are normal. PEG stie clean.  Musculoskeletal: No edema. Better tolerance of PROM as a whole--still winces at times - Neurological: communicating through single words. Writes on [employer]whiteboard Makes  eye contact and engages somewhat.  Spastic left hemiparesis trace tone LUE and tr to 1/4 LLE.--stable Moves RUE/RLE freely 5/5  Skin: Skin is warm and dry.  Psychiatric: makes eye contact more consistently. engages  Assessment/Plan: 1. 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 Physiatrist is providing close team supervision and 24 hour management of active medical problems listed below. Physiatrist and rehab team continue to assess barriers to discharge/monitor patient progress toward functional and medical goals.  Function:  Bathing Bathing position   Position: Shower  Bathing parts Body parts bathed by patient: Left arm, Chest, Abdomen, Front perineal area, Left upper leg Body parts bathed by helper: Buttocks, Right lower leg, Left lower leg, Back, Right arm  Bathing assist Assist Level: Touching or steadying assistance(Pt > 75%)      Upper Body Dressing/Undressing Upper body dressing   What is the patient wearing?: Pull over shirt/dress     Pull over shirt/dress - Perfomed by patient: Thread/unthread right sleeve, Put head through opening, Pull shirt over trunk Pull over shirt/dress - Perfomed by helper: Thread/unthread left sleeve Button up shirt - Perfomed by patient: Thread/unthread right sleeve Button up shirt - Perfomed by helper: Thread/unthread left sleeve, Pull shirt around back, Button/unbutton shirt    Upper body assist Assist Level:  (Max A)      Lower Body Dressing/Undressing Lower body dressing   What is the patient wearing?: Pants, Socks, Shoes     Pants- Performed by  patient: Thread/unthread right pants leg Pants- Performed by helper: Thread/unthread  left pants leg, Pull pants up/down, Thread/unthread right pants leg   Non-skid slipper socks- Performed by helper: Don/doff left sock   Socks - Performed by helper: Don/doff right sock, Don/doff left sock Shoes - Performed by patient: Don/doff right shoe, Don/doff left shoe Shoes - Performed by helper: Don/doff right shoe, Don/doff left shoe, Fasten right, Fasten left   AFO - Performed by helper: Don/doff left AFO      Lower body assist Assist for lower body dressing:  (Max A)      Toileting Toileting Toileting activity did not occur: Safety/medical concerns   Toileting steps completed by helper: Adjust clothing prior to toileting, Performs perineal hygiene, Adjust clothing after toileting Toileting Assistive Devices: Grab bar or rail  Toileting assist Assist level: Two helpers   Transfers Chair/bed transfer   Chair/bed transfer method: Stand pivot Chair/bed transfer assist level: Maximal assist (Pt 25 - 49%/lift and lower) Chair/bed transfer assistive device: Biochemist, clinical lift: Landscape architect Ambulation activity did not occur: Safety/medical concerns   Max distance: 30 ft Assist level: 2 helpers (max assist + w/c follow for safety)   Wheelchair   Type: Manual Max wheelchair distance: 100 ft Assist Level: Supervision or verbal cues  Cognition Comprehension Comprehension assist level: Understands basic 75 - 89% of the time/ requires cueing 10 - 24% of the time  Expression Expression assist level: Expresses basic 50 - 74% of the time/requires cueing 25 - 49% of the time. Needs to repeat parts of sentences.  Social Interaction Social Interaction assist level: Interacts appropriately 50 - 74% of the time - May be physically or verbally inappropriate.  Problem Solving Problem solving assist level: Solves basic 50 - 74% of the time/requires cueing 25 - 49% of the time  Memory  Memory assist level: Recognizes or recalls 50 - 74% of the time/requires cueing 25 - 49% of the time     Medical Problem List and Plan: 1.  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  -cont CIR-PT, OT, SLP, family ed  -continue  helmet when OOB  -Dr. Lionel December, [facility]---cranioplasty now scheduled for 1/15.   2.  DVT Prophylaxis/Anticoagulation: Mechanical: Sequential compression devices, below knee Bilateral lower extremities. LLE dopplers 12/26 negative for DVT 3. Headaches/Pain Management: controlled at present  -extremity pain still appears to be neuropathic  -maintain gabapentin at 600mg  TID          -continue hydrocodone at 10mg  prn  -consider TCA at bedtime  -overall appears improved 4. Mood: LCSW to follow for evaluation and support when appropriate.   -depression has been an issue  -zoloft at 100mg  daily    -adderall 20mg  XR added for initiation/engagement (was also using at home)   5. Neuropsych: This patient is not capable of making decisions on her own behalf. 6. Skin/Wound Care: routine pressure relief measures.  7. Fluids/Electrolytes/Nutrition:  D2/nectars per SLP-MBS today  -continue to push PO 8. VDRF:  trach stoma closed 9. New onset seizures: Continue Keppra bid.  10. Spasticity/Neuropathy LUE/LLE: see above  -baclofen at 10mg  TID, increased to 20 TID on 1/14--tolerating thus far  -LLE ROM, splinting     11. Hypotension/bradycardia  Improved  .  LOS (Days) 28 A FACE TO FACE EVALUATION WAS PERFORMED  Ranelle Oyster, MD 06/17/2016 4:38 PM

## 2016-06-17 NOTE — Progress Notes (Signed)
Occupational Therapy Weekly Progress Note  Patient Details  Name: Cheryl Gibbs MRN: [id number] Date of Birth: 10-08-1990  Beginning of progress report period: June 08, 2016 End of progress report period: June 17, 2016  Today's Date: 06/17/2016 OT Individual Time: 1100-1200 OT Individual Time Calculation (min): 60 min    Patient has met 2 of 4 short term goals.  Pt met 2 of her goals, and the other 2 are partly met in that she is progressing well but is not yet able to transfer to the L with mod A or fully get her R leg into pants during dressing.  Overall, she has made a great deal of progress with her participation, her tolerance to movement and sitting on harder surfaces, sit to stand skills, standing balance, and squat pivot transfers. She is also improving with her ability to find and maintain midline when sitting unsupported.  She continues to have great difficulty achieve pelvic symmetry for adequate posture due to severe L trunk weakness. No change in tone or AROM of LUE but she is tolerating more ROM.   Patient continues to demonstrate the following deficits: muscle weakness, decreased cardiorespiratoy endurance, abnormal tone, decreased visual acuity, decreased visual perceptual skills and hemianopsia, decreased midline orientation and decreased attention to left, decreased awareness, decreased problem solving and decreased memory and decreased sitting balance, decreased postural control and hemiplegia and therefore will continue to benefit from skilled OT intervention to enhance overall performance with BADL.  Patient progressing toward long term goals..  Continue plan of care.  OT Short Term Goals Week 3:  OT Short Term Goal 1 (Week 3): Pt will be able to complete squat pivot from bed to chair to prepare for LB dressing with mod A of 1. OT Short Term Goal 1 - Progress (Week 3): Partly met (pt can complete transfer to R with mod A but needs max A to the left) OT Short Term Goal 2  (Week 3): Pt will be able to sit to stand at sink or EOB with min A to prepare for LB dressing.  OT Short Term Goal 2 - Progress (Week 3): Met OT Short Term Goal 3 (Week 3): From sitting EOB, pt will don RLE into pants and pull up to thigh level with min A. OT Short Term Goal 3 - Progress (Week 3): Progressing toward goal OT Short Term Goal 4 (Week 3): Pt will don pullover shirt with mod A. OT Short Term Goal 4 - Progress (Week 3): Met Week 4:  OT Short Term Goal 1 (Week 4): Pt will complete BSC transfers using a squat pivot with mod A to the left and right. OT Short Term Goal 2 (Week 4): Pt will stabilize balance in standing with hemiwalker with min A to allow caregiver to assist with clothing management. OT Short Term Goal 3 (Week 4): Pt will bathe with min A in the shower. OT Short Term Goal 4 (Week 4): Pt will perform self ROM of her LUE with mod cues.  Skilled Therapeutic Interventions/Progress Updates:    Pt seen this session to facilitate transfers and sit to stand skills during ADL dressing. Pt received in bed sleeping. It took several minutes for her to wake up fully. Pt sat to EOB with min /mod A.  Requested BSC. Used squat pivot to [facility] and then sit to stand with hemiwalker to doff pants.  Improved balance with hand in front on hemiwalker vs to side on BSC rail. Pt unable to urinate even after  sitting for 10 min. Completed dressing from [facility] and transferred back to EOB. Repeated transfers to w/c. Going to L pt needs max A, mod A to R as she is able to get a higher hip lift which allows for easier pivoting. Practiced sit><stand several more times with pt pushing up with min A and mod A to descend slowly.  PROM of LUE on side table with pt encouraged to use visualization of active movement. Tolerated 5 minutes and then c/o arm pain. Splint donned on hand, lap tray, quick release belt. Pt with all needs met.  Therapy Documentation Precautions:  Precautions Precautions: Fall Precaution  Comments: helmet when OOB Restrictions Weight Bearing Restrictions: Yes   Pain: Pain Assessment Pain Assessment: 0-10 Pain Score: 4  Pain Location: Leg Pain Orientation: Left Pain Descriptors / Indicators: Aching Patients Stated Pain Goal: 2 Pain Intervention(s): Medication (See eMAR) ADL: ADL ADL Comments: refer to navigator  See Function Navigator for Current Functional Status.   Therapy/Group: Individual Therapy  Cheryl Gibbs 06/17/2016, 11:22 AM

## 2016-06-17 NOTE — Progress Notes (Signed)
Occupational Therapy Note  Patient Details  Name: Cheryl Gibbs MRN: [id number] Date of Birth: 01/20/1991  Today's Date: 06/17/2016 late entry from 06/17/2015 OT Individual Time:  -  9:00-10:00     1:1 Self care retraining at shower level. Focus on sit to stands with right UE support, begininng family education with pt's mother, tolerance of weight bearing through left LE, touching and positioning of left LE/UE, hemi dressing etc. Pt transferred in and out of shower using the STEDY to the padded tub bench (her DME preference). Pt able to tolerate sititng in shower withotu external support of a pillow, demonstrating improved trunk control and posture. Pt bathed with mod A. Pt transferred via the STEDY back to the standard w/c to dress with focus on sit to stands using platform walker for right UE support. Also trialed regular underwear today instead of a brief. Pt required max A to maintain standing with walker while total A to complete pulling up pants. Discussed d/c planning , DME education, with mother. Pt's mother provided no hands on assistance in this session.  No c/o pain in this session.   Roney MansSmith, Laquiesha [facility] 06/17/2016, 7:29 AM

## 2016-06-17 NOTE — Progress Notes (Signed)
Patient with reports of dysuria. Was cath this am due to concerns of decreased urine output per reports. Staff to continue to encourage fluid intake.  BMET checked and renal status stable. Does not like nectar liquids --will increase water flushes to 200 cc qid. Will check UA/UCS to rule out UTI.

## 2016-06-17 NOTE — Progress Notes (Signed)
Social Work Patient ID: Cheryl Gibbs, female   DOB: 25-Sep-1990, 26 y.o.   MRN: [id number]   Met with pt and mother earlier in the day yesterday.  Pt tearful as she expresses frustration with vision and not being able to write a letter to her husband.  Mother here to observe/ begin training and education.  Asking a lot of questions especially from [place].  Provides support to pt and encourages her to "calm down... You just have to focus on getting better."    Met later with mother to review team conference information.  Discussed that d/c date still targeted for 2/6 and need to confirm if she will be providing 24/7 care or if need to change plan to SNF.  Mother looks surprised as I ask this (have asked several times prior) and states, "Oh no.  I'm not doing that.  She's coming home with me and I am going to work it our with other family."  Mother very focused on wanting to confirm plans for flap surgery next week and if pt will return here following "because she's going to regress."  MD and PA aware of scheduled surgery and will be confirming plans.  Once this is decided, will need to discuss further with team about education needs for mother and any other caregivers prior to transfer.    Per mother, d/c plan is for pt to come to her home and she and other family will provide 24/7 care.  Much hands on education needed and will coordinate with tx team.   Dev Dhondt, LCSW

## 2016-06-17 NOTE — Progress Notes (Signed)
Speech Language Pathology Daily Session Note  Patient Details  Name: Cheryl ClinesLaura Gibbs MRN: [id number] Date of Birth: 08/21/1990  Today's Date: 06/17/2016 SLP Individual Time: 1300-1330 SLP Individual Time Calculation (min): 30 min  Short Term Goals: Week 4: SLP Short Term Goal 1 (Week 4): Patient will scan to left field of enviornment to locate specific objects, etc with Min A verbal and visual cues.  SLP Short Term Goal 2 (Week 4): Patient will demonstrate sustained attention to functional tasks for 10 minutes with Min A verbal cues.  SLP Short Term Goal 3 (Week 4): Patient will demonstrate basic problem solving with Min A verbal cues.  SLP Short Term Goal 4 (Week 4): Patient will self-monitor and correct errors during written expression with  Min A multimodal cues.  SLP Short Term Goal 5 (Week 4): Patient will verbalize at the word level with 50% intelligibility and Min A verbal cues.  SLP Short Term Goal 6 (Week 4): Patient will consume current diet with minimal overt s/s of aspiration and supervision verbal cues for use of swallowing compensatory strategies   Skilled Therapeutic Interventions: Skilled treatment session focused on communication goals. SLP facilitated session by providing Min A verbal cues to use voicing with pt able to intelligibly voice at the word level with ~75% accuracy. Pt required Mod A verbal cues for self-monitoring of written communication on white board and required Min A verbal cues for attention to task and to decrease perseverative comments/subjects. Pt left upright in wheelchair with quick release belt in place and all needs within reach.      Function:   Cognition Comprehension Comprehension assist level: Understands basic 75 - 89% of the time/ requires cueing 10 - 24% of the time  Expression Expression assistive device: Communication board Expression assist level: Expresses basic 50 - 74% of the time/requires cueing 25 - 49% of the time. Needs to repeat parts of  sentences.  Social Interaction Social Interaction assist level: Interacts appropriately 50 - 74% of the time - May be physically or verbally inappropriate.  Problem Solving Problem solving assist level: Solves basic 50 - 74% of the time/requires cueing 25 - 49% of the time  Memory Memory assist level: Recognizes or recalls 50 - 74% of the time/requires cueing 25 - 49% of the time    Pain Pain Assessment Pain Assessment: 0-10 Pain Score: 4  Pain Location: Leg Pain Orientation: Left Pain Descriptors / Indicators: Aching Patients Stated Pain Goal: 2 Pain Intervention(s): Medication (See eMAR)  Therapy/Group: Individual Therapy  Forest Redwine 06/17/2016, 2:12 PM

## 2016-06-17 NOTE — Progress Notes (Signed)
Speech Language Pathology Note  Patient Details  Name: Cheryl ClinesLaura Gibbs MRN: [id number] Date of Birth: 11/30/1990 Today's Date: 06/17/2016  MBSS complete. Full report located under chart review in imaging section.    Kazuma Elena 06/17/2016, 3:17 PM

## 2016-06-17 NOTE — Progress Notes (Signed)
Nutrition Follow-up  DOCUMENTATION CODES:   Obesity unspecified  INTERVENTION:  Let pt attempt to eat at meals, if po intake is <50% at that meal provide a bolus feed of Osmolite 1.5 formula via PEG at volume of 300 ml given up to TID after meals.   Continue 30 ml Prostat per tube.   Continue free water flushes of 200 ml QID.   Encourage PO intake.  NUTRITION DIAGNOSIS:   Inadequate oral intake related to inability to eat as evidenced by NPO status; diet advanced; improved  GOAL:   Patient will meet greater than or equal to 90% of their needs; met  MONITOR:   PO intake, TF tolerance, Diet advancement, Labs, Weight trends, Skin, I & O's  REASON FOR ASSESSMENT:   Consult Enteral/tube feeding initiation and management  ASSESSMENT:   26 year old female who was 10 days post partum and  was transferred to [facility] from OSH on 04/11/16  after being found unresponsive by husband.  Patient in bathtub--not submerged and activity seizing. 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  She underwent cerebral angiogram and decline neurologically shortly after admission to ICU. She was taken to OR emergently for right craniotomy for evacuation of IPH and obliteration of ruptured lenticulostriate aneurysm and placement of EVD. Trach and PEG were placed on 11/22.  She was admitted to [facility]) on 05/01/16. She is non verbal.  Pt is currently on a dysphagia 2 diet with nectar thick liquids. Meal completion has been varied from 10-100% with 85% at lunch today. Pt and RN reports pt has not been receiving bolus feeds recently as intake has improved. RD to continue with current orders to ensure adequate nutrition is met as intake at times have been 10-25%.  Labs and medications reviewed.    Diet Order:  DIET DYS 2 Room service appropriate? Yes; Fluid consistency: Nectar Thick  Skin:  Reviewed, no issues  Last BM:  1/24  Height:   Ht Readings from Last 1 Encounters:  05/20/16 _0  (1.626 m)    Weight:   Wt Readings from Last 1 Encounters:  06/17/16 178 lb 14.4 oz (81.1 kg)    Ideal Body Weight:  54.5 kg  BMI:  Body mass index is 30.71 kg/m.  Estimated Nutritional Needs:   Kcal:  1850-2050  Protein:  90-105 grams  Fluid:  1.8 - 2 L/day  EDUCATION NEEDS:   No education needs identified at this time  Corrin Parker, MS, RD, LDN Pager # [phone] After hours/ weekend pager # [phone]

## 2016-06-17 NOTE — Plan of Care (Signed)
Problem: RH BLADDER ELIMINATION Goal: RH STG MANAGE BLADDER WITH ASSISTANCE STG Manage Bladder With min Assistance    Outcome: Progressing Continues to be continent of bowel and bladder.

## 2016-06-18 ENCOUNTER — Inpatient Hospital Stay (HOSPITAL_COMMUNITY): Payer: Managed Care, Other (non HMO) | Admitting: Physical Therapy

## 2016-06-18 ENCOUNTER — Inpatient Hospital Stay (HOSPITAL_COMMUNITY): Payer: Managed Care, Other (non HMO) | Admitting: Speech Pathology

## 2016-06-18 ENCOUNTER — Inpatient Hospital Stay (HOSPITAL_COMMUNITY): Payer: Managed Care, Other (non HMO) | Admitting: Occupational Therapy

## 2016-06-18 ENCOUNTER — Encounter (HOSPITAL_COMMUNITY): Payer: Self-pay | Admitting: *Deleted

## 2016-06-18 DIAGNOSIS — F4310 Post-traumatic stress disorder, unspecified: Secondary | ICD-10-CM

## 2016-06-18 LAB — URINE CULTURE

## 2016-06-18 NOTE — Progress Notes (Signed)
[facility]IC[facility] & REHABILITATION     PROGRESS NOTE    Subjective/Complaints: Up with therapies. No new problems. Pain seems controlled  ROS: Limited due cognitive/behavioral   Objective: Vital Signs: Blood pressure 97/63, pulse 60, temperature 98 F (36.7 C), temperature source Oral, resp. rate 18, height 5\' 4"  (1.626 m), weight 81.1 kg (178 lb 14.4 oz), SpO2 96 %. Dg Swallowing Func-speech Pathology  Result Date: 06/17/2016 Objective Swallowing Evaluation: Type of Study: MBS-Modified Barium Swallow Study Patient Details Name: Cheryl ClinesLaura Gibbs MRN: [id number] Date of Birth: 08/09/1990 Today's Date: 06/17/2016 Time: [id number] Individual Time: 30 minutes Past Medical History: Past Medical History: Diagnosis Date . ADD (attention deficit disorder)  . Occipital neuralgia  Past Surgical History: Past Surgical History: Procedure Laterality Date . CESAREAN SECTION  03/31/2016 HPI: see H&P No Data Recorded Assessment / Plan / Recommendation CHL IP CLINICAL IMPRESSIONS 06/17/2016 Therapy Diagnosis Moderate oral phase dysphagia;Moderate pharyngeal phase dysphagia Clinical Impression Patient demonstrates a moderate oral  and pharyngeal sensory based dysphagia. Oral phase is characterized by impaired mastication of solid textures leading to left anterior spillage of solids and liquids and mildly prolonged AP transit with all trials. Pharyngeal phase is characterized by delayed swallow initiation and impaired timing/coordination of swallow resulting in silent  penetration and aspiration of thin liquids. Compensatory strategies attempted but were unsuccessful due to motor planning impairments. Recommend patient continue Dys. 2 textures with nectar-thick liquids with full supervision to maximize safety. Discussed results with patient, she verbalized understanding.  Impact on safety and function Moderate aspiration risk   CHL IP TREATMENT RECOMMENDATION 06/17/2016 Treatment Recommendations Therapy as outlined in  treatment plan below   Prognosis 06/17/2016 Prognosis for Safe Diet Advancement Fair Barriers to Reach Goals Cognitive deficits Barriers/Prognosis Comment -- CHL IP DIET RECOMMENDATION 06/17/2016 SLP Diet Recommendations Nectar thick liquid;Dysphagia 2 (Fine chop) solids Liquid Administration via Cup;No straw Medication Administration Crushed with puree Compensations Minimize environmental distractions;Slow rate;Small sips/bites;Monitor for anterior loss Postural Changes Remain semi-upright after after feeds/meals (Comment)   CHL IP OTHER RECOMMENDATIONS 06/17/2016 Recommended Consults -- Oral Care Recommendations Oral care BID Other Recommendations --   CHL IP FOLLOW UP RECOMMENDATIONS 06/17/2016 Follow up Recommendations Home health SLP;24 hour supervision/assistance   CHL IP FREQUENCY AND DURATION 06/17/2016 Speech Therapy Frequency (ACUTE ONLY) min 5x/week Treatment Duration 3 weeks      CHL IP ORAL PHASE 06/17/2016 Oral Phase Impaired Oral - Pudding Teaspoon -- Oral - Pudding Cup -- Oral - Honey Teaspoon -- Oral - Honey Cup -- Oral - Nectar Teaspoon -- Oral - Nectar Cup Left anterior bolus loss Oral - Nectar Straw -- Oral - Thin Teaspoon Left anterior bolus loss Oral - Thin Cup Left anterior bolus loss Oral - Thin Straw Left anterior bolus loss Oral - Puree Left anterior bolus loss Oral - Mech Soft Left anterior bolus loss;Impaired mastication;Delayed oral transit Oral - Regular NT Oral - Multi-Consistency -- Oral - Pill -- Oral Phase - Comment --  CHL IP PHARYNGEAL PHASE 06/17/2016 Pharyngeal Phase Impaired Pharyngeal- Pudding Teaspoon -- Pharyngeal -- Pharyngeal- Pudding Cup -- Pharyngeal -- Pharyngeal- Honey Teaspoon -- Pharyngeal -- Pharyngeal- Honey Cup -- Pharyngeal -- Pharyngeal- Nectar Teaspoon -- Pharyngeal -- Pharyngeal- Nectar Cup Delayed swallow initiation-vallecula Pharyngeal -- Pharyngeal- Nectar Straw -- Pharyngeal -- Pharyngeal- Thin Teaspoon Delayed swallow initiation-pyriform  sinuses;Penetration/Aspiration during swallow Pharyngeal Material enters airway, passes BELOW cords without attempt by patient to eject out (silent aspiration) Pharyngeal- Thin Cup Delayed swallow initiation-pyriform sinuses;Compensatory strategies attempted (with notebox);Penetration/Aspiration during swallow  Pharyngeal Material enters airway, passes BELOW cords without attempt by patient to eject out (silent aspiration);Material enters airway, remains ABOVE vocal cords and not ejected out Pharyngeal- Thin Straw Delayed swallow initiation-pyriform sinuses;Penetration/Aspiration during swallow Pharyngeal Material enters airway, passes BELOW cords and not ejected out despite cough attempt by patient Pharyngeal- Puree -- Pharyngeal -- Pharyngeal- Mechanical Soft Delayed swallow initiation-vallecula Pharyngeal -- Pharyngeal- Regular NT Pharyngeal -- Pharyngeal- Multi-consistency -- Pharyngeal -- Pharyngeal- Pill -- Pharyngeal -- Pharyngeal Comment --  CHL IP CERVICAL ESOPHAGEAL PHASE 06/17/2016 Cervical Esophageal Phase WFL Pudding Teaspoon -- Pudding Cup -- Honey Teaspoon -- Honey Cup -- Nectar Teaspoon -- Nectar Cup -- Nectar Straw -- Thin Teaspoon -- Thin Cup -- Thin Straw -- Puree -- Mechanical Soft -- Regular -- Multi-consistency -- Pill -- Cervical Esophageal Comment -- No flowsheet data found. PAYNE, COURTNEY 06/17/2016, 3:14 PM              No results for input(s): WBC, HGB, HCT, PLT in the last 72 hours.  Recent Labs  06/17/16 1020  NA 143  K 3.7  CL 104  GLUCOSE 101*  BUN 7  CREATININE 0.69  CALCIUM 9.4   CBG (last 3)  No results for input(s): GLUCAP in the last 72 hours.  Wt Readings from Last 3 Encounters:  06/17/16 81.1 kg (178 lb 14.4 oz)  05/20/16 84.4 kg (186 lb 1.6 oz)    Physical Exam:  Constitutional: She appears well-developed and well-nourished.  HENT: crani site clean,   Eyes: EOMI. No discharge.   Neck: Trach stoma healed    Cardiovascular:RRR Respiratory: CTA B  GI:  Soft. Bowel sounds are normal. PEG stie clean.  Musculoskeletal: No edema. Better tolerance of PROM as a whole--still winces at times - Neurological: communicating through single words. Writes on [employer]  eye contact and engages somewhat.  Spastic left hemiparesis trace tone LUE and tr to 1/4 LLE.--stable Moves RUE/RLE freely 5/5  Skin: Skin is warm and dry.  Psychiatric: engaging  Assessment/Plan: 1. 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 Physiatrist is providing close team supervision and 24 hour management of active medical problems listed below. Physiatrist and rehab team continue to assess barriers to discharge/monitor patient progress toward functional and medical goals.  Function:  Bathing Bathing position   Position: Shower  Bathing parts Body parts bathed by patient: Left arm, Chest, Abdomen, Front perineal area, Left upper leg Body parts bathed by helper: Buttocks, Right lower leg, Left lower leg, Back, Right arm  Bathing assist Assist Level: Touching or steadying assistance(Pt > 75%)      Upper Body Dressing/Undressing Upper body dressing   What is the patient wearing?: Pull over shirt/dress     Pull over shirt/dress - Perfomed by patient: Thread/unthread right sleeve, Put head through opening, Pull shirt over trunk Pull over shirt/dress - Perfomed by helper: Thread/unthread left sleeve Button up shirt - Perfomed by patient: Thread/unthread right sleeve Button up shirt - Perfomed by helper: Thread/unthread left sleeve, Pull shirt around back, Button/unbutton shirt    Upper body assist Assist Level:  (Max A)      Lower Body Dressing/Undressing Lower body dressing   What is the patient wearing?: Pants, Socks, Shoes     Pants- Performed by patient: Thread/unthread right pants leg Pants- Performed by helper: Thread/unthread  left pants leg, Pull pants up/down, Thread/unthread right pants leg   Non-skid slipper  socks- Performed by helper: Don/doff left sock   Socks - Performed by helper: Don/doff right sock, Don/doff left sock Shoes - Performed by patient: Don/doff right shoe, Don/doff left shoe Shoes - Performed by helper: Don/doff right shoe, Don/doff left shoe, Fasten right, Fasten left   AFO - Performed by helper: Don/doff left AFO      Lower body assist Assist for lower body dressing:  (Max A)      Toileting Toileting Toileting activity did not occur: Safety/medical concerns   Toileting steps completed by helper: Adjust clothing prior to toileting, Performs perineal hygiene, Adjust clothing after toileting Toileting Assistive Devices: Grab bar or rail  Toileting assist Assist level: Two helpers   Transfers Chair/bed transfer   Chair/bed transfer method: Stand pivot Chair/bed transfer assist level: Maximal assist (Pt 25 - 49%/lift and lower) Chair/bed transfer assistive device: Biochemist, clinical lift: Landscape architect Ambulation activity did not occur: Safety/medical concerns   Max distance: 30 ft Assist level: 2 helpers (max assist + w/c follow for safety)   Wheelchair   Type: Manual Max wheelchair distance: 100 ft Assist Level: Supervision or verbal cues  Cognition Comprehension Comprehension assist level: Understands basic 75 - 89% of the time/ requires cueing 10 - 24% of the time  Expression Expression assist level: Expresses basic 50 - 74% of the time/requires cueing 25 - 49% of the time. Needs to repeat parts of sentences.  Social Interaction Social Interaction assist level: Interacts appropriately 50 - 74% of the time - May be physically or verbally inappropriate.  Problem Solving Problem solving assist level: Solves basic 50 - 74% of the time/requires cueing 25 - 49% of the time  Memory Memory assist level: Recognizes or recalls 50 - 74% of the time/requires cueing 25 -  49% of the time     Medical Problem List and Plan: 1.  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  -cont CIR-PT, OT, SLP, family ed  -continue  helmet when OOB  -Dr. Lionel December, [facility]---cranioplasty now scheduled for 1/15.   2.  DVT Prophylaxis/Anticoagulation: Mechanical: Sequential compression devices, below knee Bilateral lower extremities. LLE dopplers 12/26 negative for DVT 3. Headaches/Pain Management: controlled at present  -extremity pain still appears to be neuropathic  -maintain gabapentin at 600mg  TID          -continue hydrocodone at 10mg  prn  -consider TCA at bedtime  -overall appears improved 4. Mood: LCSW to follow for evaluation and support when appropriate.   -depression has been an issue  -zoloft at 100mg  daily    -adderall 20mg  XR added for initiation/engagement (was also using at home)   5. Neuropsych: This patient is not capable of making decisions on her own behalf. 6. Skin/Wound Care: routine pressure relief measures.  7. Fluids/Electrolytes/Nutrition:  D2/nectars per SLP-MBS today  -continue to push PO 8. VDRF:  trach stoma closed 9. New onset seizures: Continue Keppra bid.  10. Spasticity/Neuropathy LUE/LLE: see above  -baclofen at 10mg  TID, increased to 20 TID on 1/14--tolerating thus far  -LLE ROM, splinting     11. Hypotension/bradycardia  Improved  .  LOS (Days) 29 A FACE TO FACE EVALUATION WAS PERFORMED  Ranelle Oyster, MD 06/18/2016 9:17 AM

## 2016-06-18 NOTE — Progress Notes (Signed)
Physical Therapy Note  Patient Details  Name: Cheryl Gibbs MRN: [id number] Date of Birth: 01/25/1991 Today's Date: 06/18/2016    Time: 1300-1355 55 minutes  1:1 No c/o pain.  Pt improving with bed mobility, requires only mod A for trunk and pt assisting with moving Lt LE.  Sitting edge of bed with supervision.  Squat pivot transfers to Rt with mod A, Lt with max A multiple attempts during session.  Gait with hemi walker 10', 20' with max A, pt improving wt shifting but requires manual facilitation for balance and total A for advancing Lt LE.   Nustep 2 x 2 minutes for LE NMR with pt fatiguing quickly with this exercise.  W/c mobility supervision in controlled environments, min A for home environment and obstacle negotiation.     Diana Armijo 06/18/2016, 1:55 PM

## 2016-06-18 NOTE — Plan of Care (Signed)
Problem: RH BLADDER ELIMINATION Goal: RH STG MANAGE BLADDER WITH ASSISTANCE STG Manage Bladder With min Assistance    Outcome: Progressing Maintaining bladder continence.

## 2016-06-18 NOTE — Progress Notes (Signed)
Occupational Therapy Session Note  Patient Details  Name: Cheryl Gibbs MRN: [id number] Date of Birth: 11/11/1990  Today's Date: 06/18/2016 OT Individual Time: 1100-1200 OT Individual Time Calculation (min): 60 min    Short Term Goals: Week 4:  OT Short Term Goal 1 (Week 4): Pt will complete BSC transfers using a squat pivot with mod A to the left and right. OT Short Term Goal 2 (Week 4): Pt will stabilize balance in standing with hemiwalker with min A to allow caregiver to assist with clothing management. OT Short Term Goal 3 (Week 4): Pt will bathe with min A in the shower. OT Short Term Goal 4 (Week 4): Pt will perform self ROM of her LUE with mod cues.  Skilled Therapeutic Interventions/Progress Updates: patient participation as follows:  Expressive communication= clear about 50% of session, patient used communication board for the rest  W/c to bariactric stedy=close S                       stedy to tub bench transfer (stand to sit)=Min A          Dynamic sitting balance on tub transfer   bench= close S to CGA after patient was fatigued                          UB bathing=Min A for left underarm for through rinsing                LB bathing=Max A x2 (due to poor dynamic balance after fatigued)           Patient voiced need to toilet and c/o of pain/discomfort seated on the Stedy >  Stedy to regular toilet transfer= CGA for lowering self with control   Once on the toilet this clinician and tech offered to help patient dress upper body, but she indicated she did not want to dress until she voided    (This clinician placed towel on upper and mid section of patient at this point for modesty)  Then nursing came in to stay with patient while she voided and help her finish dressing.      Therapy Documentation Precautions:  Precautions Precautions: Fall Precaution Comments: helmet when OOB Restrictions Weight Bearing Restrictions: No   Pain: Pain Assessment Faces Pain Scale: Hurts  a little bit  See Function Navigator for Current Functional Status.   Therapy/Group: Individual Therapy  Bud Faceickett, Rayla Pember [facility] 06/18/2016, 1:14 PM

## 2016-06-18 NOTE — Progress Notes (Signed)
Speech Language Pathology Daily Session Note  Patient Details  Name: Cheryl ClinesLaura Wandrey MRN: [id number] Date of Birth: 12/11/1990  Today's Date: 06/18/2016 SLP Individual Time: 0915-1000 SLP Individual Time Calculation (min): 45 min  Short Term Goals: Week 4: SLP Short Term Goal 1 (Week 4): Patient will scan to left field of enviornment to locate specific objects, etc with Min A verbal and visual cues.  SLP Short Term Goal 2 (Week 4): Patient will demonstrate sustained attention to functional tasks for 10 minutes with Min A verbal cues.  SLP Short Term Goal 3 (Week 4): Patient will demonstrate basic problem solving with Min A verbal cues.  SLP Short Term Goal 4 (Week 4): Patient will self-monitor and correct errors during written expression with  Min A multimodal cues.  SLP Short Term Goal 5 (Week 4): Patient will verbalize at the word level with 50% intelligibility and Min A verbal cues.  SLP Short Term Goal 6 (Week 4): Patient will consume current diet with minimal overt s/s of aspiration and supervision verbal cues for use of swallowing compensatory strategies   Skilled Therapeutic Interventions: Skilled treatment session focused on speech and dysphagia goals. Patient verbalized at the word and phrase level with extra time and was ~90% intelligible while expressing her wants/needs with Min A verbal and question cues. Patient did not utilize written communication this session and only utilized verbal expression!  Patient also consumed breakfast meal of Dys. 2 textures with nectar-thick liquids without overt s/s of aspiration and demonstrated mildly prolonged mastication with minimal oral residue that patient self-monitor and corrected independently. Recommend patient continue current diet.  Patient left upright in wheelchair with neuropsychologist present. Continue with current plan of care.      Function:  Eating Eating   Modified Consistency Diet: Yes Eating Assist Level: More than reasonable  amount of time;Set up assist for;Supervision or verbal cues   Eating Set Up Assist For: Opening containers       Cognition Comprehension Comprehension assist level: Understands basic 75 - 89% of the time/ requires cueing 10 - 24% of the time  Expression   Expression assist level: Expresses basic 75 - 89% of the time/requires cueing 10 - 24% of the time. Needs helper to occlude trach/needs to repeat words.  Social Interaction Social Interaction assist level: Interacts appropriately 50 - 74% of the time - May be physically or verbally inappropriate.  Problem Solving Problem solving assist level: Solves basic 50 - 74% of the time/requires cueing 25 - 49% of the time  Memory Memory assist level: Recognizes or recalls 50 - 74% of the time/requires cueing 25 - 49% of the time    Pain No/Denies Pain   Therapy/Group: Individual Therapy  Galaxy Borden 06/18/2016, 3:44 PM

## 2016-06-18 NOTE — Progress Notes (Signed)
Physical Therapy Session Note  Patient Details  Name: Cheryl ClinesLaura Grange MRN: [id number] Date of Birth: 01/09/1991  Today's Date: 06/18/2016 PT Individual Time: [id number] PT Individual Time Calculation (min): 43 min   Short Term Goals: Week 4:  PT Short Term Goal 1 (Week 4): Pt will demo static sitting balance for functional task x 3 minutes with supervision PT Short Term Goal 2 (Week 4): Pt will propel w/c with min A 25' in controlled environment PT Short Term Goal 3 (Week 4): Pt will perform gait with max A x 10' in controlled environment  Skilled Therapeutic Interventions/Progress Updates:  Pt received in bed, crying because tomorrow is her daughter's birthday. Therapist provided therapeutic listening, emotional support, and encouragement. Pt transferred supine>sitting EOB with min assist, bed rails & HOB elevated. Pt initiated using RLE to assist LLE to EOB. Therapist donned pt's L air cast & shoes total assist for time management. Throughout session pt performed squat pivot transfers (bed>w/c, w/c<>mat table) with max assist +1. Pt continues to require cuing for head/hips relationship and manual facilitation for posture and weight shifting. Pt with improving ability to shift weight during task. Pt sat on edge of mat x 15 minutes with BLE support while reaching outside of BOS for cups with task focusing on core strengthening, postural righting reactions, balance, and L attention. Pt required mod fading to min assist for dynamic balance. At end of session pt left sitting in w/c in room with all needs within reach.     Therapy Documentation Precautions:  Precautions Precautions: Fall Precaution Comments: helmet when OOB Restrictions Weight Bearing Restrictions: No   See Function Navigator for Current Functional Status.   Therapy/Group: Individual Therapy  Sandi MariscalVictoria M Alhassan Everingham 06/18/2016, 9:36 AM

## 2016-06-18 NOTE — Consult Note (Signed)
  NEUROPSYCHOLOGY PSYCHOSOCIAL- [facility]FIDENTIAL [facility]   Ms. Cheryl Gibbs is a 26 year old woman, who was previously seen for a psychodiagnostic evaluation to assess for possible psychiatric symptoms in the setting of intracerebral hemorrhage.  During that evaluation, she acknowledged depressed mood and history of PTSD.  Therefore, a follow-up session was recommended.  This was the purpose of today's session.    During today's session, Ms. Cheryl Gibbs reported feeling "sad" and mostly attributed her low mood to knowing that her daughter's birthday is tomorrow and she will not be with her.  She commented that her daughter will be heart-broken.  She spent significant time lamenting the fact that she is supposed to protect her children, but instead, is causing them pain and heartbreak.  We processed other sources of depression, including her irritation with how others perceive her (e.g. owing to her helmet and scars).  She then expressed that she feels ready for discharge.  She was not receptive to hearing about why staff members may have recommended the discharge date that is currently set; she reported feeling that she is able to walk, talk, and eat and her mother is ready to care for her so she should be able to go home.  She requested a meeting with her physician and that message will be relayed to him.    Ms. Cheryl Gibbs may benefit from continued follow-up with neuropsychology during her stay in inpatient rehab and may also require psychological follow-up post-discharge.  Contact information for providers in her area should be included in her discharge paperwork.    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  Clinical Neuropsychologist

## 2016-06-19 ENCOUNTER — Inpatient Hospital Stay (HOSPITAL_COMMUNITY): Payer: Managed Care, Other (non HMO)

## 2016-06-19 ENCOUNTER — Inpatient Hospital Stay (HOSPITAL_COMMUNITY): Payer: Managed Care, Other (non HMO) | Admitting: Speech Pathology

## 2016-06-19 ENCOUNTER — Inpatient Hospital Stay (HOSPITAL_COMMUNITY): Payer: Managed Care, Other (non HMO) | Admitting: Occupational Therapy

## 2016-06-19 MED ORDER — ADULT MULTIVITAMIN W/MINERALS CH
1.0000 | ORAL_TABLET | Freq: Every day | ORAL | Status: DC
  Administered 2016-06-20 – 2016-06-22 (×3): 1
  Filled 2016-06-19 (×3): qty 1

## 2016-06-19 NOTE — Progress Notes (Signed)
[place] PHYSICAL MEDICINE & REHABILITATION     PROGRESS NOTE    Subjective/Complaints: Lying in bed. Seems a bit downtrodden. Says she's "ok".   ROS: Limited due cognitive/behavioral   Objective: Vital Signs: Blood pressure 110/60, pulse 62, temperature 98.5 F (36.9 C), temperature source Oral, resp. rate 18, height 5\' 4"  (1.626 m), weight 81.1 kg (178 lb 14.4 oz), SpO2 96 %. No results found. No results for input(s): WBC, HGB, HCT, PLT in the last 72 hours.  Recent Labs  06/17/16 1020  NA 143  K 3.7  CL 104  GLUCOSE 101*  BUN 7  CREATININE 0.69  CALCIUM 9.4   CBG (last 3)  No results for input(s): GLUCAP in the last 72 hours.  Wt Readings from Last 3 Encounters:  06/17/16 81.1 kg (178 lb 14.4 oz)  05/20/16 84.4 kg (186 lb 1.6 oz)    Physical Exam:  Constitutional: She appears well-developed and well-nourished.  HENT: crani site clean,   Eyes: EOMI. No discharge.   Neck: Trach stoma healed    Cardiovascular:RRR Respiratory: CTA B  GI: Soft. Bowel sounds are normal. PEG stie clean/dry.  Musculoskeletal: No edema. Improved tolerance of PROM Neurological: communicating through single words. Writes on [employer]  eye contact and engages somewhat.  Spastic left hemiparesis trace tone LUE and tr to 1/4 LLE. No changes today Moves RUE/RLE freely 5/5  Skin: Skin is warm and dry.  Psychiatric: engaging  Assessment/Plan: 1. 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 Physiatrist is providing close team supervision and 24 hour management of active medical problems listed below. Physiatrist and rehab team continue to assess barriers to discharge/monitor patient progress toward functional and medical goals.  Function:  Bathing Bathing position   Position: Shower  Bathing parts Body parts bathed by patient: Left arm, Chest,  Abdomen, Front perineal area, Left upper leg Body parts bathed by helper: Buttocks, Right lower leg, Left lower leg, Back, Right arm  Bathing assist Assist Level: Touching or steadying assistance(Pt > 75%)      Upper Body Dressing/Undressing Upper body dressing   What is the patient wearing?: Button up shirt     Pull over shirt/dress - Perfomed by patient: Thread/unthread right sleeve, Put head through opening, Pull shirt over trunk Pull over shirt/dress - Perfomed by helper: Thread/unthread left sleeve Button up shirt - Perfomed by patient: Thread/unthread right sleeve Button up shirt - Perfomed by helper: Thread/unthread left sleeve, Pull shirt around back, Button/unbutton shirt    Upper body assist Assist Level:  (Max A)      Lower Body Dressing/Undressing Lower body dressing   What is the patient wearing?: Pants, Socks, Shoes     Pants- Performed by patient: Thread/unthread right pants leg Pants- Performed by helper: Thread/unthread left pants leg, Pull pants up/down   Non-skid slipper socks- Performed by helper: Don/doff left sock   Socks - Performed by helper: Don/doff right sock, Don/doff left sock Shoes - Performed by patient: Don/doff right shoe, Don/doff left shoe Shoes - Performed by helper: Don/doff right shoe, Don/doff left shoe, Fasten right, Fasten left   AFO - Performed by helper: Don/doff left AFO      Lower body assist Assist for lower body dressing:  (Max A)      Toileting Toileting Toileting activity did not occur: Safety/medical concerns   Toileting steps completed by helper: Adjust clothing prior to toileting,  Performs perineal hygiene, Adjust clothing after toileting Toileting Assistive Devices: Grab bar or rail  Toileting assist Assist level: Two helpers   Transfers Chair/bed transfer   Chair/bed transfer method: Squat pivot Chair/bed transfer assist level: Maximal assist (Pt 25 - 49%/lift and lower) Chair/bed transfer assistive device:  Biochemist, clinicalWalker Mechanical lift: Magazine features editortedy   Locomotion Ambulation Ambulation activity did not occur: Safety/medical concerns   Max distance: 30 ft Assist level: 2 helpers (max assist + w/c follow for safety)   Wheelchair   Type: Manual Max wheelchair distance: 100 ft Assist Level: Supervision or verbal cues  Cognition Comprehension Comprehension assist level: Understands basic 75 - 89% of the time/ requires cueing 10 - 24% of the time  Expression Expression assist level: Expresses basic 75 - 89% of the time/requires cueing 10 - 24% of the time. Needs helper to occlude trach/needs to repeat words.  Social Interaction Social Interaction assist level: Interacts appropriately 50 - 74% of the time - May be physically or verbally inappropriate.  Problem Solving Problem solving assist level: Solves basic 50 - 74% of the time/requires cueing 25 - 49% of the time  Memory Memory assist level: Recognizes or recalls 50 - 74% of the time/requires cueing 25 - 49% of the time     Medical Problem List and Plan: 1.  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  -cont CIR-PT, OT, SLP, family ed  -continue  helmet when OOB  -Dr. Lionel DecemberFargen, [facility]---cranioplasty now scheduled for 1/15.   2.  DVT Prophylaxis/Anticoagulation: Mechanical: Sequential compression devices, below knee Bilateral lower extremities. LLE dopplers 12/26 negative for DVT 3. Headaches/Pain Management: controlled at present  -extremity pain still appears to be neuropathic  -maintain gabapentin at 600mg  TID          -continue hydrocodone at 10mg  prn  -consider TCA at bedtime  -overall appears improved 4. Mood: LCSW to follow for evaluation and support when appropriate.   -depression has been an issue  -zoloft at 100mg  daily    -adderall 20mg  XR added for initiation/engagement (was also using at home)   5. Neuropsych: This patient is not capable of making decisions on her own  behalf. 6. Skin/Wound Care: routine pressure relief measures.  7. Fluids/Electrolytes/Nutrition:  D2/nectars per SLP-   -eating better  -could remove PEG soon 8. VDRF:  trach stoma closed 9. New onset seizures: Continue Keppra bid.  10. Spasticity/Neuropathy LUE/LLE: see above  -baclofen at 10mg  TID, increased to 20 TID on 1/14--tolerating thus far  -LLE ROM, splinting     11. Hypotension/bradycardia  Improved  .  LOS (Days) 30 A FACE TO FACE EVALUATION WAS PERFORMED  Ranelle OysterSWARTZ,Clarisse Rodriges T, MD 06/19/2016 10:44 AM

## 2016-06-19 NOTE — Progress Notes (Signed)
Speech Language Pathology Weekly Progress and Session Note  Patient Details  Name: Cheryl Gibbs MRN: [id number] Date of Birth: [birth year]  Beginning of progress report period: June 12, 2016 End of progress report period: June 19, 2016  Today's Date: 06/19/2016 SLP Individual Time: 1400-1430 SLP Individual Time Calculation (min): 30 min  Short Term Goals: Week 4: SLP Short Term Goal 1 (Week 4): Patient will scan to left field of enviornment to locate specific objects, etc with Min A verbal and visual cues.  SLP Short Term Goal 1 - Progress (Week 4): Met SLP Short Term Goal 2 (Week 4): Patient will demonstrate sustained attention to functional tasks for 10 minutes with Min A verbal cues.  SLP Short Term Goal 2 - Progress (Week 4): Met SLP Short Term Goal 3 (Week 4): Patient will demonstrate basic problem solving with Min A verbal cues.  SLP Short Term Goal 3 - Progress (Week 4): Not met SLP Short Term Goal 4 (Week 4): Patient will self-monitor and correct errors during written expression with  Min A multimodal cues.  SLP Short Term Goal 4 - Progress (Week 4): Met SLP Short Term Goal 5 (Week 4): Patient will verbalize at the word level with 50% intelligibility and Min A verbal cues.  SLP Short Term Goal 5 - Progress (Week 4): Met SLP Short Term Goal 6 (Week 4): Patient will consume current diet with minimal overt s/s of aspiration and supervision verbal cues for use of swallowing compensatory strategies  SLP Short Term Goal 6 - Progress (Week 4): Met    New Short Term Goals: Week 5: SLP Short Term Goal 1 (Week 5): Patient will demonstrate basic problem solving with Min A verbal cues.  SLP Short Term Goal 2 (Week 5): Patient will recall new, daily information with Min A multimodal cues.  SLP Short Term Goal 3 (Week 5): Patient will verbally express her wants/needs at the phrase level with Min A verbal cues to self-monitor and correct errors.  SLP Short Term Goal 4 (Week 5): Patient will  demonstrate 90% intelligibility at the phrase level with Min A multimodal cues.  SLP Short Term Goal 5 (Week 5): Patient will consume current diet with minimal overt s/s of aspiration and Mod I for use of swallowing compensatory strategies  SLP Short Term Goal 6 (Week 5): Patient will consume trials of thin liquids with minimal overt s/s of aspiration and demonstrate a timely swallow initiation (less than 5 seconds) in 75% of opportunities with Min A verbal cues.   Weekly Progress Updates: Patient has made functional gains and has met 5 of 6 STG's this reporting period. Currently, patient is consuming Dys. 2 textures with nectar-thick liquids with minimal overt s/s of aspiration and requires supervision verbal cues for use of swallowing compensatory strategies. Patient had repeat MBS today which showed continued silent aspiration of thin liquids. Therefore, patient initiated the water protocol.  Patient is currently expressing her wants/needs verbally and with written communication and requires overall Min A to self-monitor and correct errors. Patient also continues to require overall Mod A cues to complete functional and familiar tasks safely in regards to problem solving and recall. Patient and family education is ongoing. Patient would benefit from continued skilled SLP intervention to maximize her cognitive and swallowing function as well as her functional communication in order to maximize her overall functional independence prior to discharge.      Intensity: Minumum of 1-2 x/day, 30 to 90 minutes Frequency: 3 to 5 out of  7 days Duration/Length of Stay: 2/6 Treatment/Interventions: Functional tasks;Patient/family education;Cueing hierarchy;Dysphagia/aspiration precaution training;Therapeutic Activities;Cognitive remediation/compensation;Internal/external aids;Speech/Language facilitation   Daily Session  Skilled Therapeutic Interventions: Skilled treatment session focused on dysphagia and  functional communication goals. SLP facilitated session by providing additional education to the patient in regards to her current swallowing function, diet recommendations, clinical reasoning and goals of continued SLP intervention. SLP also reviewed MBS study with patient to reinforce information. Patient utilized verbal expression 50% of session with 90% intelligibility and Mod A verbal cues to self-monitor and correct errors. Patient also utilized written expression in 50% of session and required verbal cues to self-correct spelling errors. Patient left upright in bed with all needs within reach. Continue with current plan of care.      Function:   Cognition Comprehension Comprehension assist level: Understands basic 75 - 89% of the time/ requires cueing 10 - 24% of the time  Expression   Expression assist level: Expresses basic 75 - 89% of the time/requires cueing 10 - 24% of the time. Needs helper to occlude trach/needs to repeat words.  Social Interaction Social Interaction assist level: Interacts appropriately 50 - 74% of the time - May be physically or verbally inappropriate.  Problem Solving Problem solving assist level: Solves basic 50 - 74% of the time/requires cueing 25 - 49% of the time  Memory Memory assist level: Recognizes or recalls 50 - 74% of the time/requires cueing 25 - 49% of the time   Pain No/Denies Pain   Therapy/Group: Individual Therapy  Caelin Rayl 06/19/2016, 3:33 PM

## 2016-06-19 NOTE — Progress Notes (Signed)
Occupational Therapy Session Note  Patient Details  Name: Ned ClinesLaura Woodell MRN: [id number] Date of Birth: 12/03/1990  Today's Date: 06/19/2016 OT Individual Time: [id number] OT Individual Time Calculation (min): 60 min    Skilled Therapeutic Interventions/Progress Updates: Patient toileting upon arrival and asked to keep sitting to void.    She was able to sit unsupported in back, holding onto bariatric Stedy with her right arm while toileting for over 30 minutes without any losses of balance.   She required total A for cleansing and clothing mgmt after the void and BM, but she was able to maintain her dynamic balance in the [place].    She stated she was too tired for more therapy and requested to ly down to rest.    She required max Assist for doffing pants and donning the pants in which she planned to sleep.     She required max assist for positing on her right side - purple pillow placed at end of her face in order to prevent pain on the right surgical side of her head.  She was left in her bed with her mom sitting in the recliner in the room next to her.     Therapy Documentation Precautions:  Precautions Precautions: Fall Precaution Comments: helmet when OOB Restrictions Weight Bearing Restrictions: No   Pain:denied  See Function Navigator for Current Functional Status.   Therapy/Group: Individual Therapy  Bud Faceickett, Dreama Kuna [facility] 06/19/2016, 8:01 PM

## 2016-06-19 NOTE — Progress Notes (Signed)
Occupational Therapy Session Note  Patient Details  Name: Cheryl Gibbs MRN: [id number] Date of Birth: 1991/04/28  Today's Date: 06/19/2016 OT Individual Time: 6811-5726 OT Individual Time Calculation (min): 57 min    Short Term Goals: Week 3:  OT Short Term Goal 1 (Week 3): Pt will be able to complete squat pivot from bed to chair to prepare for LB dressing with mod A of 1. OT Short Term Goal 1 - Progress (Week 3): Partly met (pt can complete transfer to R with mod A but needs max A to the left) OT Short Term Goal 2 (Week 3): Pt will be able to sit to stand at sink or EOB with min A to prepare for LB dressing.  OT Short Term Goal 2 - Progress (Week 3): Met OT Short Term Goal 3 (Week 3): From sitting EOB, pt will don RLE into pants and pull up to thigh level with min A. OT Short Term Goal 3 - Progress (Week 3): Progressing toward goal OT Short Term Goal 4 (Week 3): Pt will don pullover shirt with mod A. OT Short Term Goal 4 - Progress (Week 3): Met Week 4:  OT Short Term Goal 1 (Week 4): Pt will complete BSC transfers using a squat pivot with mod A to the left and right. OT Short Term Goal 2 (Week 4): Pt will stabilize balance in standing with hemiwalker with min A to allow caregiver to assist with clothing management. OT Short Term Goal 3 (Week 4): Pt will bathe with min A in the shower. OT Short Term Goal 4 (Week 4): Pt will perform self ROM of her LUE with mod cues.  Skilled Therapeutic Interventions/Progress Updates:    Pt seen this session to focus on sit to stand, standing balance, trunk/postural control, and LUE NMR. Pt received in w/c. She had just finished toileting with nursing staff.  Completed donning button up shirt and pants with max A. She was able to wt shift forward well to push upto stand and stabilize standing with hemiwalker 2x when therapist assisted with clothing management.  Taken to gym to work on squat pivot transfers. Pt has made excellent progress and is now  transferring to R with min and L with mod A.  On mat, pt is now demonstrating a great deal of progress with her trunk strength. She can now maintain midline for 30 min, sit with symmetrical hips and straight spine. Worked on LUE wt bearing with reaching for clothes pins to her L and then trunk elongation of R with reaching to elevated height to the R.  Pt's participation is very active which is excellent improvement from previous weeks.  Also worked on gentle AROM of LUE with table slides. Pt taken back to her room with quick release belt on and all needs met.  Therapy Documentation Precautions:  Precautions Precautions: Fall Precaution Comments: helmet when OOB Restrictions Weight Bearing Restrictions: No    Vital Signs: Therapy Vitals Temp: 98.5 F (36.9 C) Temp Source: Oral Pulse Rate: 62 Resp: 18 BP: 110/60 Patient Position (if appropriate): Lying Oxygen Therapy SpO2: 96 % O2 Device: Not Delivered Pain: Pain Assessment Pain Assessment: No/denies pain  ADL: ADL ADL Comments: refer to navigator    See Function Navigator for Current Functional Status.   Therapy/Group: Individual Therapy  Brinda Focht 06/19/2016, 8:29 AM

## 2016-06-19 NOTE — Progress Notes (Signed)
Physical Therapy Session Note  Patient Details  Name: Cheryl Gibbs MRN: [id number] Date of Birth: 10/01/1990  Today's Date: 06/19/2016 PT Individual Time: [id number] PT Individual Time Calculation (min): 47 min   Short Term Goals: Week 4:  PT Short Term Goal 1 (Week 4): Pt will demo static sitting balance for functional task x 3 minutes with supervision PT Short Term Goal 2 (Week 4): Pt will propel w/c with min A 25' in controlled environment PT Short Term Goal 3 (Week 4): Pt will perform gait with max A x 10' in controlled environment  Skilled Therapeutic Interventions/Progress Updates:   Pt working on finishing breakfast upon PT arrival with PT providing supervision to complete. W/c mobility training with hemi-technique with supervision to min assist needed with verbal cues for technique. Requires min assist for tight obstacle negotiation and turn to R. Blocked practice transfer training for squat pivot technique with emphasis on pt directing set-up of transfer to work towards going home with caregiver. Pt required min verbal cues for sequencing of transfer. Required mod to max assist for pivot transfer with cues for anterior weightshift to improve clearance of bottom. Neuro re-ed for scooting to L EOM to aid with carryover of transfers, sit <> stands and postural control in standing from EOM using hemiwalker for support. Required overall mod assist for sit to stands with facilitation for equal weightbearing and blocking of LLE for stability. End of session pt request to return back to bed and required max assist for transfer and min assist for lifting LLE onto the bed. Positioned with all needs in reach.   Therapy Documentation Precautions:  Precautions Precautions: Fall Precaution Comments: helmet when OOB Restrictions Weight Bearing Restrictions: No  Pain: Reports pain in L ankle today due to positioning of PRAFO overnight and hypersensitivity of LUE/LLE. Utilized ACE wrap on LLE only due  to pain.   See Function Navigator for Current Functional Status.   Therapy/Group: Individual Therapy   Karolee StampsGray, Cheryl Gibbs  Cedarius Kersh B. Genevive Printup, PT, DPT  06/19/2016, 11:16 AM

## 2016-06-20 ENCOUNTER — Inpatient Hospital Stay (HOSPITAL_COMMUNITY): Payer: Managed Care, Other (non HMO) | Admitting: Occupational Therapy

## 2016-06-20 NOTE — Progress Notes (Signed)
Complained of Headache, PRN hycet given via peg at 2102, with relief. Continues to take all meds via peg. Cheryl Gibbs, Cheryl Gibbs

## 2016-06-20 NOTE — Progress Notes (Signed)
Occupational Therapy Session Note  Patient Details  Name: Ned ClinesLaura Abel MRN: [id number] Date of Birth: 04/22/1991  Today's Date: 06/20/2016 OT Individual Time: 0900-1000 OT Individual Time Calculation (min): 60 min    Short Term Goals: Week 4:  OT Short Term Goal 1 (Week 4): Pt will complete BSC transfers using a squat pivot with mod A to the left and right. OT Short Term Goal 2 (Week 4): Pt will stabilize balance in standing with hemiwalker with min A to allow caregiver to assist with clothing management. OT Short Term Goal 3 (Week 4): Pt will bathe with min A in the shower. OT Short Term Goal 4 (Week 4): Pt will perform self ROM of her LUE with mod cues.  Skilled Therapeutic Interventions/Progress Updates:    Pt seen for ADL training with a focus on transfers and trunk control.  Pt received in bed eager to shower.  Completed bed to w/c with only min A going to her L side.  Due to strong squat pivot this am, discussed trying transfer to tub bench without steady.  Tub bench set up reversed to allow pt to sq pivot to R and use R hand on bar. To tub bench, mod A. On bench, increased difficulty with posture, but pt able to maintain static by leaning back on bench back.  Getting off bench with sq  Pivot to L was VERY difficult and pt having great difficulty lifting hips to clear.  Next shower, we will use the Stedy. Pt was able to sit to stand with mod A and bar in shower.  She also used long sponge to wash R foot.  Improving oral communication.  Pt donned shirt with max A and then requested to toilet, sq pivot to L to toilet with max A. Pt requested time to sit. Placed stedy in front of pt for her to use to get off toilet once finished. Nurse tech took over pt's care as therapy session was over.  Therapy Documentation Precautions:  Precautions Precautions: Fall Precaution Comments: helmet when OOB Restrictions Weight Bearing Restrictions: No    Vital Signs: Therapy Vitals Temp: 98.3 F (36.8  C) Temp Source: Oral Pulse Rate: 64 Resp: 18 BP: 106/67 Patient Position (if appropriate): Lying Oxygen Therapy SpO2: 93 % O2 Device: Not Delivered   Pain: no c/o pain except in L hand when touched   ADL: ADL ADL Comments: refer to navigator  See Function Navigator for Current Functional Status.   Therapy/Group: Individual Therapy  SAGUIER,JULIA 06/20/2016, 8:19 AM

## 2016-06-20 NOTE — Progress Notes (Signed)
Patient ID: Cheryl Gibbs, female   DOB: 04/05/1991, 26 y.o.   MRN: [id number]   06/20/16.  Cheryl ClinesLaura Gibbs is a 26 y.o. 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 Subjective: Mute.  No new problems. Slept well.  Past Medical History:  Diagnosis Date  . ADD (attention deficit disorder)   . Occipital neuralgia      Objective: Vital signs in last 24 hours: Temp:  [98.3 F (36.8 C)-98.6 F (37 C)] 98.3 F (36.8 C) (01/27 0546) Pulse Rate:  [64-68] 64 (01/27 0546) Resp:  [18] 18 (01/27 0546) BP: (98-106)/(67-68) 106/67 (01/27 0546) SpO2:  [93 %-98 %] 93 % (01/27 0546) Weight change:  Last BM Date: 06/18/16  Intake/Output from previous day: 01/26 0701 - 01/27 0700 In: 600 [P.O.:600] Out: 400 [Urine:400] Last cbgs: CBG (last 3)  No results for input(s): GLUCAP in the last 72 hours.   Physical Exam General: No apparent distress  Obese, mute HEENT: not dry; s/p trach Lungs: Normal effort. Lungs clear to auscultation, no crackles or wheezes. Cardiovascular: Regular rate and rhythm, no edema Abdomen: S/NT/ND; BS(+) s/p PEG Musculoskeletal:  Unchanged; L ankle brace  Neurological: No new neurological deficits; L HP Skin: clear  Mental state: Alert, mute    Lab Results: BMET    Component Value Date/Time   NA 143 06/17/2016 1020   K 3.7 06/17/2016 1020   CL 104 06/17/2016 1020   CO2 27 06/17/2016 1020   GLUCOSE 101 (H) 06/17/2016 1020   BUN 7 06/17/2016 1020   CREATININE 0.69 06/17/2016 1020   CALCIUM 9.4 06/17/2016 1020   GFRNONAA >60 06/17/2016 1020   GFRAA >60 06/17/2016 1020   CBC    Component Value Date/Time   WBC 13.9 (H) 06/09/2016 0539   RBC 5.05 06/09/2016 0539   HGB 14.4 06/09/2016 0539   HCT 44.7 06/09/2016 0539   PLT 338 06/09/2016 0539   MCV 88.5 06/09/2016 0539   MCH 28.5 06/09/2016 0539   MCHC 32.2 06/09/2016 0539   RDW 15.1 06/09/2016 0539   LYMPHSABS  1.9 05/21/2016 0535   MONOABS 0.4 05/21/2016 0535   EOSABS 0.2 05/21/2016 0535   BASOSABS 0.0 05/21/2016 0535    Studies/Results: No results found.  Medications: I have reviewed the patient's current medications.  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 DVT prophylaxis- continue mechanical compression Seizures- continue Keppra  Length of stay, days: 31  Rogelia BogaKWIATKOWSKI,Carless Slatten FRANK , MD 06/20/2016, 10:10 AM

## 2016-06-21 ENCOUNTER — Inpatient Hospital Stay (HOSPITAL_COMMUNITY): Payer: Managed Care, Other (non HMO) | Admitting: Physical Therapy

## 2016-06-21 NOTE — Progress Notes (Signed)
Physical Therapy Session Note  Patient Details  Name: Cheryl ClinesLaura Farnan MRN: [id number] Date of Birth: 10/29/1990  Today's Date: 06/21/2016 PT Individual Time: [id number] PT Individual Time Calculation (min): 60 min   Short Term Goals: Week 4:  PT Short Term Goal 1 (Week 4): Pt will demo static sitting balance for functional task x 3 minutes with supervision PT Short Term Goal 2 (Week 4): Pt will propel w/c with min A 25' in controlled environment PT Short Term Goal 3 (Week 4): Pt will perform gait with max A x 10' in controlled environment  Skilled Therapeutic Interventions/Progress Updates:  Pt received in bed with visitor present for first half of session. Pt denied c/o pain at rest & agreeable to tx, requesting to walk. Pt required min assist to transfer supine>sitting EOB with HOB elevated & bed rails. Therapist provided manual facilitation for transferring LLE to EOB. Therapist donned pt's aircast, B shoes, helmet, and L hand splint total assist for time management. Pt requires total assist for w/c set up and multimodal cuing for anterior weight shift and head/hips relationship to complete squat pivot transfer to L to w/c. Pt propelled w/c throughout unit with R hemi technique and supervision in open, controlled environment. Gait training in hallway with hemiwalker; pt ambulated 60 ft with mod assist with ability to sequence gait pattern without verbal cuing but manual facilitation for weight shifting & total assist for advancement & placement of LLE with therapist providing approximation and blocking at L knee to prevent buckling & hyperextension. In BI gym pt transferred sit<>stand and remained standing with mod assist with therapist blocking L knee to prevent buckling, but pt then demonstrating hyperextension with max assist to correct. Pt tolerated standing while engaging in dynavision with task focusing on LLE neuro re-ed, standing balance, attention to L, and standing tolerance. Pt with 3-4 second  longer reaction time to L side as compared to R, however pt demonstrates improving attention to L during functional tasks. Provided pt with L elevating leg rest as pt c/o pain with LLE in dependent position. Pt left in w/c in room with QRB donned & all needs within reach.      Therapy Documentation Precautions:  Precautions Precautions: Fall Precaution Comments: helmet when OOB Restrictions Weight Bearing Restrictions: No   See Function Navigator for Current Functional Status.   Therapy/Group: Individual Therapy  Sandi MariscalVictoria M Ericka Marcellus 06/21/2016, 7:13 PM

## 2016-06-21 NOTE — Progress Notes (Signed)
Patient ID: Cheryl Gibbs, female   DOB: 06/26/1990, 26 y.o.   MRN: [id number]   06/21/2016.  Cheryl ClinesLaura Gibbs is a 26 y.o. 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  Subjective: No new complaints. Alert and much more talkative today.  Past Medical History:  Diagnosis Date  . ADD (attention deficit disorder)   . Occipital neuralgia      Objective: Vital signs in last 24 hours: Temp:  [97.4 F (36.3 C)-98.5 F (36.9 C)] 98.5 F (36.9 C) (01/28 0422) Pulse Rate:  [55-60] 55 (01/28 0422) Resp:  [17-18] 17 (01/28 0422) BP: (97)/(59-64) 97/64 (01/28 0422) SpO2:  [98 %-99 %] 98 % (01/28 0422) Weight change:  Last BM Date: 06/20/16  Intake/Output from previous day: 01/27 0701 - 01/28 0700 In: 240 [P.O.:240] Out: -  Last cbgs: CBG (last 3)  No results for input(s): GLUCAP in the last 72 hours.   Physical Exam General: No apparent distress  overweight HEENT: not dry  S/p trach Lungs: Normal effort. Lungs clear to auscultation, no crackles or wheezes. Cardiovascular: Regular rate and rhythm, no edema Abdomen: S/NT/ND; BS(+)  S/p trach Musculoskeletal:  unchanged Neurological: No new neurological deficits; L HP Skin: clear   Mental state: Alert, oriented, cooperative    Lab Results: BMET    Component Value Date/Time   NA 143 06/17/2016 1020   K 3.7 06/17/2016 1020   CL 104 06/17/2016 1020   CO2 27 06/17/2016 1020   GLUCOSE 101 (H) 06/17/2016 1020   BUN 7 06/17/2016 1020   CREATININE 0.69 06/17/2016 1020   CALCIUM 9.4 06/17/2016 1020   GFRNONAA >60 06/17/2016 1020   GFRAA >60 06/17/2016 1020   CBC    Component Value Date/Time   WBC 13.9 (H) 06/09/2016 0539   RBC 5.05 06/09/2016 0539   HGB 14.4 06/09/2016 0539   HCT 44.7 06/09/2016 0539   PLT 338 06/09/2016 0539   MCV 88.5 06/09/2016 0539   MCH 28.5 06/09/2016 0539   MCHC 32.2 06/09/2016 0539   RDW 15.1 06/09/2016  0539   LYMPHSABS 1.9 05/21/2016 0535   MONOABS 0.4 05/21/2016 0535   EOSABS 0.2 05/21/2016 0535   BASOSABS 0.0 05/21/2016 0535    Studies/Results: No results found.  Medications: I have reviewed the patient's current medications.  Assessment/Plan:  S/p R frontal ICH secondary ruptured aneurysm with cognitive deficits and left sided weakness DVT prophylaxis- continue mechanical compression Seizures- continue Keppra  Length of stay, days: 32  Rogelia BogaKWIATKOWSKI,Guhan Bruington FRANK , MD 06/21/2016, 9:36 AM

## 2016-06-22 ENCOUNTER — Inpatient Hospital Stay (HOSPITAL_COMMUNITY): Payer: Managed Care, Other (non HMO) | Admitting: Speech Pathology

## 2016-06-22 ENCOUNTER — Inpatient Hospital Stay (HOSPITAL_COMMUNITY): Payer: Managed Care, Other (non HMO) | Admitting: Occupational Therapy

## 2016-06-22 ENCOUNTER — Inpatient Hospital Stay (HOSPITAL_COMMUNITY): Payer: Managed Care, Other (non HMO) | Admitting: Physical Therapy

## 2016-06-22 LAB — URINALYSIS, ROUTINE W REFLEX MICROSCOPIC
BILIRUBIN URINE: NEGATIVE
Glucose, UA: NEGATIVE mg/dL
Hgb urine dipstick: NEGATIVE
KETONES UR: NEGATIVE mg/dL
Nitrite: NEGATIVE
Protein, ur: NEGATIVE mg/dL
Specific Gravity, Urine: 1.017 (ref 1.005–1.030)
pH: 5 (ref 5.0–8.0)

## 2016-06-22 MED ORDER — FAMOTIDINE 40 MG/5ML PO SUSR
20.0000 mg | Freq: Every day | ORAL | Status: DC
  Administered 2016-06-23: 20 mg
  Filled 2016-06-22: qty 2.5

## 2016-06-22 MED ORDER — ATORVASTATIN CALCIUM 20 MG PO TABS
20.0000 mg | ORAL_TABLET | Freq: Every day | ORAL | Status: DC
  Administered 2016-06-22: 20 mg
  Filled 2016-06-22: qty 1

## 2016-06-22 NOTE — Progress Notes (Signed)
Physical Therapy Session Note  Patient Details  Name: Cheryl Gibbs MRN: [id number] Date of Birth: 12/25/1990  Today's Date: 06/22/2016 PT Individual Time: 0901-1001 PT Individual Time Calculation (min): 60 min   Short Term Goals: Week 4:  PT Short Term Goal 1 (Week 4): Pt will demo static sitting balance for functional task x 3 minutes with supervision PT Short Term Goal 2 (Week 4): Pt will propel w/c with min A 25' in controlled environment PT Short Term Goal 3 (Week 4): Pt will perform gait with max A x 10' in controlled environment  Skilled Therapeutic Interventions/Progress Updates:  Pt received in bed & agreeable to tx, noting pain in buttocks with therapist assisting pt out of bed. Therapist donned pt's helmet, air cast, LUE splint and B shoes max/total assist for time management. Pt transferred to EOB with min assist and cuing for technique; pt able to pull to sitting at EOB but requires assistance to transfer buttocks closer to edge, as well as use of HOB elevated & bed rails. Attempted stand pivot transfer bed>w/c with hemiwalker but pt with difficulty sequencing task despite multimodal cuing & instruction. Pt then completed squat pivot bed>w/c with max assist +1 with multimodal cuing for sequencing and technique as pt unable to sequence transfer independently. Pt completed car transfer with car seat set at 30" tall to simulate her mother's SUV. Pt requires max assist for stand pivot w/c<>car seat with therapist blocking L knee to prevent buckling and assisting with pivoting. Pt also requires assistance to scoot back onto seat and transfer BLE in/out of car. Pt completed transfer a second time with slightly improved ability to sequence task. Pt would benefit from hands on training with her mother in real car prior to d/c. Pt propelled w/c to [employer] [place] with assistance for ramp negotiation but supervision in open, controlled environment. Pt noted dizziness that worsened with turning at end of  session. Pt's BP = 112/69 mmHg. Pt left in handoff to SLP.     Therapy Documentation Precautions:  Precautions Precautions: Fall Precaution Comments: helmet when OOB Restrictions Weight Bearing Restrictions: No  See Function Navigator for Current Functional Status.   Therapy/Group: Individual Therapy  Sandi MariscalVictoria M Naiyah Klostermann 06/22/2016, 12:45 PM

## 2016-06-22 NOTE — Progress Notes (Signed)
Occupational Therapy Session Note  Patient Details  Name: Cheryl Gibbs MRN: [id number] Date of Birth: [birth year]  Today's Date: 06/22/2016 OT Individual Time: 1045-1200 OT Individual Time Calculation (min): 75 min    Short Term Goals: Week 4:  OT Short Term Goal 1 (Week 4): Pt will complete BSC transfers using a squat pivot with mod A to the left and right. OT Short Term Goal 2 (Week 4): Pt will stabilize balance in standing with hemiwalker with min A to allow caregiver to assist with clothing management. OT Short Term Goal 3 (Week 4): Pt will bathe with min A in the shower. OT Short Term Goal 4 (Week 4): Pt will perform self ROM of her LUE with mod cues.  Skilled Therapeutic Interventions/Progress Updates:    Pt seen for ADL retraining of shower, dressing, grooming. Pt brushed teeth and then followed water protocol for 3oz.  Discussed recent progress and goals verbally 50% and with communication board 50%.  Pt chose her clothing from closet.  Used Stedy to transfer from w/c to tub bench with min A.  Used long sponge in shower to reach R arm and feet with mod cues on how to manage long handle.  Transferred out of shower with stedy to w/c. Completed dressing from w/c with focus on sit to stand 2x to hemi walker. Once in standing, needed mod A to maintain balance. Pt c/o fatigue and requested to get back to bed. Squat pivot to R with min-mod A.  Adjusted in bed per pt's requests. Pt with all needs met.   Therapy Documentation Precautions:  Precautions Precautions: Fall Precaution Comments: helmet when OOB Restrictions Weight Bearing Restrictions: No    Vital Signs: Therapy Vitals Temp: 98.3 F (36.8 C) Temp Source: Oral Pulse Rate: (!) 50 Resp: 16 BP: 110/68 Patient Position (if appropriate): Lying Oxygen Therapy SpO2: 94 % O2 Device: Not Delivered Pain: Pain Assessment Pain Score: 4  - headache, RN aware, not time for more pain meds ADL: ADL ADL Comments: refer to  navigator  See Function Navigator for Current Functional Status.   Therapy/Group: Individual Therapy  [name] 06/22/2016, 8:33 AM

## 2016-06-22 NOTE — Plan of Care (Signed)
Problem: RH Bed to Chair Transfers Goal: LTG Patient will perform bed/chair transfers w/assist (PT) LTG: Patient will perform bed/chair transfers with assistance, with/without cues (PT).  Downgrade 2/2 slow progress  Problem: RH Car Transfers Goal: LTG Patient will perform car transfers with assist (PT) LTG: Patient will perform car transfers with assistance (PT).  Downgrade 2/2 slow progress

## 2016-06-22 NOTE — Plan of Care (Signed)
Problem: RH Bed Mobility Goal: LTG Patient will perform bed mobility with assist (PT) LTG: Patient will perform bed mobility with assistance, with/without cues (PT).  Downgrade 2/2 slow progress   

## 2016-06-22 NOTE — Progress Notes (Signed)
Speech Language Pathology Daily Session Notes  Patient Details  Name: Cheryl Gibbs MRN: [id number] Date of Birth: 10/14/1990  Today's Date: 06/22/2016   Session 1: SLP Individual Time: 1000-1030 SLP Individual Time Calculation (min): 30 min   Session 2: SLP Individual Time: 1400-1430 SLP Individual Time Calculation (min): 30 min  Short Term Goals: Week 5: SLP Short Term Goal 1 (Week 5): Patient will demonstrate basic problem solving with Min A verbal cues.  SLP Short Term Goal 2 (Week 5): Patient will recall new, daily information with Min A multimodal cues.  SLP Short Term Goal 3 (Week 5): Patient will verbally express her wants/needs at the phrase level with Min A verbal cues to self-monitor and correct errors.  SLP Short Term Goal 4 (Week 5): Patient will demonstrate 90% intelligibility at the phrase level with Min A multimodal cues.  SLP Short Term Goal 5 (Week 5): Patient will consume current diet with minimal overt s/s of aspiration and Mod I for use of swallowing compensatory strategies  SLP Short Term Goal 6 (Week 5): Patient will consume trials of thin liquids with minimal overt s/s of aspiration and demonstrate a timely swallow initiation (less than 5 seconds) in 75% of opportunities with Min A verbal cues.   Skilled Therapeutic Interventions:  Session 1: Skilled treatment session focused on speech and dysphagia goals. SLP facilitated session by providing skilled observation with trials of Dys. 3 textures. Patient demonstrated efficient but mildly prolonged mastication that was exacerbated by pain (missing crowns) and did not demonstrate any overt s/s of aspiration. Recommend continued trials with SLP. Patient also utilized verbal expression at the phrase level to express basic wants/needs and required Min-Mod A verbal cues to self-monitor and correct errors to achieve ~80% intelligibility. Patient left upright in wheelchair with all needs within reach. Continue with current plan of  care.   Session 2: Skilled treatment session focused on dysphagia and speech goals. SLP provided set-up assist with the suction toothbrush for patient to perform oral care. Patient consumed trials of thin liquids via cup without overt s/s of aspiration but required Min A verbal and visual cues for small sips and complete labial seal to decrease left anterior spillage.  Recommend continued trials with SLP. Patient also verbalized wants/needs at the phrase level with 90% intelligibility and supervision verbal cues to self-monitor and correct errors. Patient left upright in bed with all needs within reach. Continue with current plan of care.   Function:  Eating Eating   Modified Consistency Diet: Yes Eating Assist Level: Set up assist for;Supervision or verbal cues   Eating Set Up Assist For: Opening containers       Cognition Comprehension Comprehension assist level: Understands basic 75 - 89% of the time/ requires cueing 10 - 24% of the time  Expression   Expression assist level: Expresses basic 75 - 89% of the time/requires cueing 10 - 24% of the time. Needs helper to occlude trach/needs to repeat words.  Social Interaction Social Interaction assist level: Interacts appropriately 75 - 89% of the time - Needs redirection for appropriate language or to initiate interaction.  Problem Solving Problem solving assist level: Solves basic 75 - 89% of the time/requires cueing 10 - 24% of the time  Memory Memory assist level: Recognizes or recalls 75 - 89% of the time/requires cueing 10 - 24% of the time    Pain No/Denies Pain   Therapy/Group: Individual Therapy  Cheryl Gibbs 06/22/2016, 12:20 PM

## 2016-06-22 NOTE — Progress Notes (Signed)
[place] PHYSICAL MEDICINE & REHABILITATION     PROGRESS NOTE    Subjective/Complaints: Had a quiet weekend. I asked her if she need anything and she said "home".  We discussed the possiblity of removing her PEG--and she pointed to medications going through PEG----and asked how would she take the meds then?  ROS: Limited due cognitive/behavioral   Objective: Vital Signs: Blood pressure 100/63, pulse (!) 53, temperature 97.5 F (36.4 C), temperature source Oral, resp. rate 18, height 5\' 4"  (1.626 m), weight 81.1 kg (178 lb 14.4 oz), SpO2 97 %. No results found. No results for input(s): WBC, HGB, HCT, PLT in the last 72 hours. No results for input(s): NA, K, CL, GLUCOSE, BUN, CREATININE, CALCIUM in the last 72 hours.  Invalid input(s): CO CBG (last 3)  No results for input(s): GLUCAP in the last 72 hours.  Wt Readings from Last 3 Encounters:  06/17/16 81.1 kg (178 lb 14.4 oz)  05/20/16 84.4 kg (186 lb 1.6 oz)    Physical Exam:  Constitutional: She appears well-developed and well-nourished.  HENT: crani site clean,   Eyes: EOMI. No discharge.   Neck: Trach stoma healed    Cardiovascular:RRR Respiratory: CTA B  GI: Soft. Bowel sounds are normal. PEG stie clean/dry.  Musculoskeletal: No edema. Improved tolerance of PROM in both LUE and LLE Neurological: communicating through single words and phrases. Engaging a lot more. Marland Kitchen  Spastic left hemiparesis trace tone LUE and tr to 1/4 LLE. No changes today Moves RUE/RLE freely 5/5  Skin: Skin is warm and dry.  Psychiatric: pleasant but still flat. Occasionally smiles  Assessment/Plan: 1. 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 Physiatrist is providing close team supervision and 24 hour management of active medical problems listed below. Physiatrist and rehab team continue to assess barriers to  discharge/monitor patient progress toward functional and medical goals.  Function:  Bathing Bathing position   Position: Shower  Bathing parts Body parts bathed by patient: Left arm, Chest, Abdomen, Front perineal area, Left upper leg, Right upper leg, Right lower leg, Right arm, Left lower leg (used long sponge with mod cues) Body parts bathed by helper: Buttocks, Back  Bathing assist Assist Level: Touching or steadying assistance(Pt > 75%)      Upper Body Dressing/Undressing Upper body dressing   What is the patient wearing?: Pull over shirt/dress     Pull over shirt/dress - Perfomed by patient: Thread/unthread right sleeve, Put head through opening, Pull shirt over trunk Pull over shirt/dress - Perfomed by helper: Thread/unthread left sleeve Button up shirt - Perfomed by patient: Thread/unthread right sleeve Button up shirt - Perfomed by helper: Thread/unthread left sleeve, Pull shirt around back, Button/unbutton shirt    Upper body assist Assist Level:  (Max A)      Lower Body Dressing/Undressing Lower body dressing   What is the patient wearing?: Pants, Socks, Shoes     Pants- Performed by patient: Thread/unthread right pants leg Pants- Performed by helper: Thread/unthread left pants leg, Pull pants up/down   Non-skid slipper socks- Performed by helper: Don/doff left sock   Socks - Performed by helper: Don/doff right sock, Don/doff left sock Shoes - Performed by patient: Don/doff right shoe, Don/doff left shoe Shoes - Performed by helper: Don/doff right shoe, Don/doff left shoe, Fasten right, Fasten left   AFO - Performed by helper: Don/doff left AFO      Lower  body assist Assist for lower body dressing:  (Max A)      Toileting Toileting Toileting activity did not occur: Refused   Toileting steps completed by helper: Adjust clothing prior to toileting, Performs perineal hygiene, Adjust clothing after toileting Toileting Assistive Devices: Other (comment) (stedy)   Toileting assist Assist level: Two helpers   Transfers Chair/bed transfer   Chair/bed transfer method: Squat pivot Chair/bed transfer assist level: Maximal assist (Pt 25 - 49%/lift and lower) Chair/bed transfer assistive device: Armrests Mechanical lift: Stedy   Locomotion Ambulation Ambulation activity did not occur: Safety/medical concerns   Max distance: 60 ft Assist level: Moderate assist (Pt 50 - 74%)   Wheelchair   Type: Manual Max wheelchair distance: 150 ft Assist Level: Supervision or verbal cues  Cognition Comprehension Comprehension assist level: Understands basic 75 - 89% of the time/ requires cueing 10 - 24% of the time  Expression Expression assist level: Expresses basic 75 - 89% of the time/requires cueing 10 - 24% of the time. Needs helper to occlude trach/needs to repeat words.  Social Interaction Social Interaction assist level: Interacts appropriately 75 - 89% of the time - Needs redirection for appropriate language or to initiate interaction.  Problem Solving Problem solving assist level: Solves basic 75 - 89% of the time/requires cueing 10 - 24% of the time  Memory Memory assist level: Recognizes or recalls 75 - 89% of the time/requires cueing 10 - 24% of the time     Medical Problem List and Plan: 1.  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  -cont CIR-PT, OT, SLP, family ed  -continue  helmet when OOB  -Dr. Lionel DecemberFargen, [facility]---cranioplasty now scheduled for 1/15.   2.  DVT Prophylaxis/Anticoagulation: Mechanical: Sequential compression devices, below knee Bilateral lower extremities. LLE dopplers 12/26 negative for DVT 3. Headaches/Pain Management: controlled at present  -extremity pain still appears to be neuropathic  -maintain gabapentin at 600mg  TID          -continue hydrocodone at 10mg  prn  -consider TCA at bedtime  -overall appears improved 4. Mood: LCSW to follow for  evaluation and support when appropriate.   -depression has been an issue  -zoloft at 100mg  daily    -adderall 20mg  XR added for initiation/engagement (was also using at home)   5. Neuropsych: This patient is not capable of making decisions on her own behalf. 6. Skin/Wound Care: routine pressure relief measures.  7. Fluids/Electrolytes/Nutrition:  D2/nectars per SLP-   -eating better  -consider PEG removal later this week. Would need to switch meds to pill form 8. VDRF:  trach stoma closed 9. New onset seizures: Continue Keppra bid.  10. Spasticity/Neuropathy LUE/LLE: see above  -baclofen  20 TID    -LLE ROM, splinting     11. Hypotension/bradycardia  Improved  .  LOS (Days) 33 A FACE TO FACE EVALUATION WAS PERFORMED  Ranelle OysterSWARTZ,Lynnsie Linders T, MD 06/22/2016 12:55 PM

## 2016-06-22 NOTE — Progress Notes (Signed)
Patient complained of burning sensation when urinating, on call notified. U/A ordered and was collected. We continue to monitor.

## 2016-06-22 NOTE — Progress Notes (Signed)
Social Work Patient ID: Cheryl ClinesLaura Luchsinger, female   DOB: 03/31/1991, 26 y.o.   MRN: [id number]   Spoke with pt's mother today to schedule family education.  She will be in tomorrow at 1:00.  Explained that tx requesting multiple days of training and she states, "well I was in last week?"  Again, stressing to mother that there is a LOT of hands on training that needs to be completed prior to d/c.  Mother also "hoping she can stay there til her surgery on the 15th."  Will discuss in team conference.  Alois Colgan, LCSW

## 2016-06-23 ENCOUNTER — Inpatient Hospital Stay (HOSPITAL_COMMUNITY): Payer: Managed Care, Other (non HMO) | Admitting: Occupational Therapy

## 2016-06-23 ENCOUNTER — Inpatient Hospital Stay (HOSPITAL_COMMUNITY): Payer: Managed Care, Other (non HMO) | Admitting: Speech Pathology

## 2016-06-23 ENCOUNTER — Inpatient Hospital Stay (HOSPITAL_COMMUNITY): Payer: Managed Care, Other (non HMO) | Admitting: Physical Therapy

## 2016-06-23 LAB — CBC
HEMATOCRIT: 41.4 % (ref 36.0–46.0)
Hemoglobin: 13.2 g/dL (ref 12.0–15.0)
MCH: 28.3 pg (ref 26.0–34.0)
MCHC: 31.9 g/dL (ref 30.0–36.0)
MCV: 88.7 fL (ref 78.0–100.0)
Platelets: 284 10*3/uL (ref 150–400)
RBC: 4.67 MIL/uL (ref 3.87–5.11)
RDW: 15.1 % (ref 11.5–15.5)
WBC: 7.3 10*3/uL (ref 4.0–10.5)

## 2016-06-23 LAB — BASIC METABOLIC PANEL
Anion gap: 9 (ref 5–15)
BUN: 6 mg/dL (ref 6–20)
CHLORIDE: 106 mmol/L (ref 101–111)
CO2: 26 mmol/L (ref 22–32)
Calcium: 9.4 mg/dL (ref 8.9–10.3)
Creatinine, Ser: 0.63 mg/dL (ref 0.44–1.00)
GFR calc Af Amer: >60 mL/min (ref >60)
GFR calc non Af Amer: >60 mL/min (ref >60)
GLUCOSE: 89 mg/dL (ref 65–99)
POTASSIUM: 3.8 mmol/L (ref 3.5–5.1)
Sodium: 141 mmol/L (ref 135–145)

## 2016-06-23 MED ORDER — FAMOTIDINE 10 MG PO TABS
10.0000 mg | ORAL_TABLET | Freq: Every day | ORAL | Status: DC
  Administered 2016-06-24 – 2016-07-08 (×15): 10 mg via ORAL
  Filled 2016-06-23 (×15): qty 1

## 2016-06-23 MED ORDER — LEVETIRACETAM 100 MG/ML PO SOLN
750.0000 mg | Freq: Two times a day (BID) | ORAL | Status: DC
  Administered 2016-06-23 – 2016-06-25 (×5): 750 mg via ORAL
  Filled 2016-06-23 (×6): qty 10

## 2016-06-23 MED ORDER — HYDROCODONE-ACETAMINOPHEN 10-325 MG PO TABS
1.0000 | ORAL_TABLET | Freq: Four times a day (QID) | ORAL | Status: DC | PRN
  Administered 2016-06-23 – 2016-07-08 (×28): 1 via ORAL
  Filled 2016-06-23 (×29): qty 1

## 2016-06-23 MED ORDER — SERTRALINE HCL 100 MG PO TABS
100.0000 mg | ORAL_TABLET | Freq: Every day | ORAL | Status: DC
  Administered 2016-06-24 – 2016-07-08 (×15): 100 mg via ORAL
  Filled 2016-06-23 (×16): qty 1

## 2016-06-23 MED ORDER — ATORVASTATIN CALCIUM 20 MG PO TABS
20.0000 mg | ORAL_TABLET | Freq: Every day | ORAL | Status: DC
  Administered 2016-06-23 – 2016-07-08 (×16): 20 mg via ORAL
  Filled 2016-06-23 (×16): qty 1

## 2016-06-23 MED ORDER — LEVETIRACETAM 750 MG PO TABS
750.0000 mg | ORAL_TABLET | Freq: Two times a day (BID) | ORAL | Status: DC
  Filled 2016-06-23: qty 1

## 2016-06-23 MED ORDER — GABAPENTIN 600 MG PO TABS
600.0000 mg | ORAL_TABLET | Freq: Three times a day (TID) | ORAL | Status: DC
  Administered 2016-06-23 – 2016-07-09 (×46): 600 mg via ORAL
  Filled 2016-06-23 (×47): qty 1

## 2016-06-23 NOTE — Progress Notes (Signed)
Speech Language Pathology Daily Session Note  Patient Details  Name: Cheryl ClinesLaura Melchior MRN: [id number] Date of Birth: 10/26/1990  Today's Date: 06/23/2016 SLP Individual Time: 1300-1330 SLP Individual Time Calculation (min): 30 min  Short Term Goals: Week 5: SLP Short Term Goal 1 (Week 5): Patient will demonstrate basic problem solving with Min A verbal cues.  SLP Short Term Goal 2 (Week 5): Patient will recall new, daily information with Min A multimodal cues.  SLP Short Term Goal 3 (Week 5): Patient will verbally express her wants/needs at the phrase level with Min A verbal cues to self-monitor and correct errors.  SLP Short Term Goal 4 (Week 5): Patient will demonstrate 90% intelligibility at the phrase level with Min A multimodal cues.  SLP Short Term Goal 5 (Week 5): Patient will consume current diet with minimal overt s/s of aspiration and Mod I for use of swallowing compensatory strategies  SLP Short Term Goal 6 (Week 5): Patient will consume trials of thin liquids with minimal overt s/s of aspiration and demonstrate a timely swallow initiation (less than 5 seconds) in 75% of opportunities with Min A verbal cues.   Skilled Therapeutic Interventions: Skilled treatment session focused on speech and dysphagia goals. SLP facilitated session by providing extra time and Mod A verbal and question cues for patient to self-monitor and correct errors in order to achieve ~75% intelligibility at the phrase level during expression of wants/needs. Patient also consumed trials of Dys. 3 textures and demonstrated efficient mastication with minimal oral residue and intermittent use of a liquid wash to help clear oral cavity. Recommend trial tray prior to upgrade. SLP also spent extensive time educating the patient, her mother and the RN of why patient should not take her medications whole at this time and the importance of taking her medications by mouth in order to facilitate removal of PEG tube. Patient's mother  also educated in regards to patient's current swallowing function, diet recommendations, appropriate textures, compensatory strategies and goals of skilled SLP intervention. She verbalized understanding but will need reinforcement.      Function:  Eating Eating   Modified Consistency Diet: Yes Eating Assist Level: Set up assist for;Supervision or verbal cues   Eating Set Up Assist For: Opening containers       Cognition Comprehension Comprehension assist level: Understands basic 75 - 89% of the time/ requires cueing 10 - 24% of the time  Expression   Expression assist level: Expresses basic 75 - 89% of the time/requires cueing 10 - 24% of the time. Needs helper to occlude trach/needs to repeat words.  Social Interaction Social Interaction assist level: Interacts appropriately 75 - 89% of the time - Needs redirection for appropriate language or to initiate interaction.  Problem Solving Problem solving assist level: Solves basic 75 - 89% of the time/requires cueing 10 - 24% of the time  Memory Memory assist level: Recognizes or recalls 75 - 89% of the time/requires cueing 10 - 24% of the time    Pain No/Denies Pain   Therapy/Group: Individual Therapy  Alynna Hargrove 06/23/2016, 2:06 PM

## 2016-06-23 NOTE — Progress Notes (Signed)
Occupational Therapy Session Note  Patient Details  Name: Cheryl Gibbs MRN: [id number] Date of Birth: Nov 18, 1990  Today's Date: 06/23/2016 OT Individual Time: 4239-5320 and 2334 -1430 OT Individual Time Calculation (min): 60 min and 45 min    Short Term Goals: Week 4:  OT Short Term Goal 1 (Week 4): Pt will complete BSC transfers using a squat pivot with mod A to the left and right. OT Short Term Goal 2 (Week 4): Pt will stabilize balance in standing with hemiwalker with min A to allow caregiver to assist with clothing management. OT Short Term Goal 3 (Week 4): Pt will bathe with min A in the shower. OT Short Term Goal 4 (Week 4): Pt will perform self ROM of her LUE with mod cues.  Skilled Therapeutic Interventions/Progress Updates:    Visit 1:  Pain: no c/o pain Pt seen for ADL retraining with a focus on postural control and sit to stand. Pt received in w/c completing breakfast. Completed shower on open seat tub bench using stedy to transfer. A towel wedge used under L hip to help with positioning, but pt continuing with L pelvic tilt downward. Pt may do better with standard tub bench.  Transferred back to chair with stedy for dressing. Worked on sit to stand to hemiwalker 3x to maintain balance to allow therapist to pull up pants. Pt continually leaning heavily to the L and not safe to maintain stand.  Pt instead used firm bedrail and was able to stabilize better.  Pt in w/c with belt and helmet and all needs met.  Visit 2:  Pain: c/o pain in LUE with PROM Pt seen this session for family education with pt's mother. Pt's mom showed this therapist photos of the 2 bathroom options, 2 shower options, and garden tub option. Discussed with mom which bathroom would be best to use (the one with a transfer to the R and a wall on the R to place a bar).  Set up room to simulate that environment. Demonstrated squat pivot to mother to and from toilet.  Asked mother to try with my help. Mod cues for hand  placement, head/hip ratio, wt shifting technique. I needed to provide 70% of the transfer assist.  Mother states that she found a stedy lift online and plans to purchase as she feels very comfortable using that with Cheryl Gibbs. She feels this is the most realistic option. As of now, none of the bathing  Options are safe. The best options are to remove the glass wall and door of shower and place with curtain and tubbench to squat pivot to. Recommended to mother that she wait for onsite evaluation from home health OT.   Pt resting in w/c with mother in room with her with quick release belt on.   Therapy Documentation Precautions:  Precautions Precautions: Fall Precaution Comments: helmet when OOB Restrictions Weight Bearing Restrictions: No    Vital Signs: Therapy Vitals Temp: 97.6 F (36.4 C) Temp Source: Oral Pulse Rate: 63 Resp: 18 BP: 100/71 Patient Position (if appropriate): Lying Oxygen Therapy SpO2: 96 % O2 Device: Not Delivered ADL: ADL ADL Comments: refer to navigator See Function Navigator for Current Functional Status.   Therapy/Group: Individual Therapy  [place] 06/23/2016, 8:26 AM

## 2016-06-23 NOTE — Progress Notes (Signed)
Pt was able to tolerate pain medication crushed in applesauce.

## 2016-06-23 NOTE — Progress Notes (Signed)
Physical Therapy Session Note  Patient Details  Name: Cheryl Gibbs MRN: [id number] Date of Birth: 09/08/1990  Today's Date: 06/23/2016 PT Individual Time: [id number] PT Individual Time Calculation (min): 57 min   Short Term Goals: Week 5:  PT Short Term Goal 1 (Week 5): Pt will sequence squat pivot transfer with min cuing from therapist.  PT Short Term Goal 2 (Week 5): Pt will propel w/c with min cuing to attend to L.  Skilled Therapeutic Interventions/Progress Updates:  Pt received in w/c with mother [name] present for family ed. Pt noted occasional pain in LLE with transfers & limb repositioned for comfort. Discussed d/c plan with therapist recommending pt go home at w/c level. Educated pt on improved functional mobility and safety at w/c level versus ambulation; pt became emotional reporting "I have a life and kids" and therapist reinforced need for safe d/c plan. Almira CoasterGina reports she plans to buy a Stedy to utilize at home. Reviewed need to wear PRAFO boot at night and hand splint during day and night to prevent contractures. Pt's mother reports she is already aware of how to don resting hand splint, PRAFO boot, and air cast. Educated Gina on pt's L inattention & need for cuing during all tasks. Pt propelled w/c throughout unit with R hemi technique and supervision with min cuing from mother to attend to L. Provided demonstration for squat pivot transfer w/c<>mat table with pt & mother return demonstrating w/c<>mat table and w/c<>couch. Pt & mother require min cuing for safety, sequencing, and coordination of task but they were able to complete squat pivot transfers without physical assistance from therapist. Pt requires cuing for head/hips relationship from mother but continues to demonstrate poor anterior weight shifting. Pt easily frustrated when transferring with mother & they both will benefit from further hands on training. Discussed home entry (w/c width = 29" and doorway with level entry = 29") and  pt is able to access wider door but has 2 steps to access entrance. Provided demonstration and educated pt & mother on technique for stair negotiation in w/c with 2 person assist but they did not get to practice hands on training of this. Almira CoasterGina reports she can transfer pt in level doorway with Stedy or by placing her in electric scooter & letting her access doorway that way. At end of session pt noted fatigue & mother assisted pt back to bed. Pt left in bed with 4 rails up (per family request) & mother present to supervise. Pt & mother would both benefit from further hands on caregiver training to increase safety & improve technique for w/c mobility, transfers, bed mobility, stair negotiation, and car transfer into elevated SUV (30").  Educated pt & Gina on pt's need to wear air cast & helmet when OOB.     Almira CoasterGina reports she has a recliner pt will sit in at home; pt & mother would benefit from hands on training for transfers w/c<>recliner.   Therapy Documentation Precautions:  Precautions Precautions: Fall Precaution Comments: helmet when OOB Restrictions Weight Bearing Restrictions: No   See Function Navigator for Current Functional Status.   Therapy/Group: Individual Therapy  Sandi MariscalVictoria M Mckenzi Buonomo 06/23/2016, 5:21 PM

## 2016-06-23 NOTE — Progress Notes (Signed)
Physical Therapy Weekly Progress Note  Patient Details  Name: Geselle Dulski MRN: [id number] Date of Birth: 06/27/1990  Beginning of progress report period: June 16, 2016 End of progress report period: June 23, 2016  Today's Date: 06/23/2016  Patient has met 3 of 3 short term goals.  Pt is making fair progress; pt continues to require max assist for sequencing & technique for squat pivot transfers, attention to L, and safety awareness. Pt with improving ability to propel w/c with R hemi technique in controlled environment. Pt would benefit from further skilled PT treatment for focus on transfers, d/c planning, and pt/caregiver hands on training.   Patient continues to demonstrate the following deficits muscle weakness and muscle joint tightness, decreased cardiorespiratoy endurance, abnormal tone and decreased coordination, decreased attention to left, decreased initiation, decreased attention, decreased awareness, decreased problem solving, decreased safety awareness, decreased memory, and decreased sitting balance, decreased standing balance, decreased postural control, hemiplegia and decreased balance strategies and therefore will continue to benefit from skilled PT intervention to increase functional independence with mobility.  Patient progressing toward long term goals..  Pt's transfer goals were downgraded to max assist 2/2 slow progress. Continue POC.  PT Short Term Goals Week 4:  PT Short Term Goal 1 (Week 4): Pt will demo static sitting balance for functional task x 3 minutes with supervision PT Short Term Goal 1 - Progress (Week 4): Met PT Short Term Goal 2 (Week 4): Pt will propel w/c with min A 25' in controlled environment PT Short Term Goal 2 - Progress (Week 4): Met PT Short Term Goal 3 (Week 4): Pt will perform gait with max A x 10' in controlled environment PT Short Term Goal 3 - Progress (Week 4): Met Week 5:  PT Short Term Goal 1 (Week 5): Pt will sequence squat pivot  transfer with min cuing from therapist.  PT Short Term Goal 2 (Week 5): Pt will propel w/c with min cuing to attend to L.  Therapy Documentation Precautions:  Precautions Precautions: Fall Precaution Comments: helmet when OOB Restrictions Weight Bearing Restrictions: No   See Function Navigator for Current Functional Status.    M  06/23/2016, 12:22 PM   

## 2016-06-23 NOTE — Progress Notes (Signed)
[place] PHYSICAL MEDICINE & REHABILITATION     PROGRESS NOTE    Subjective/Complaints: In good spirits. Talking up a storm in the room this morning!  ROS: Limited due cognitive/behavioral   Objective: Vital Signs: Blood pressure 100/71, pulse 63, temperature 97.6 F (36.4 C), temperature source Oral, resp. rate 18, height 5\' 4"  (1.626 m), weight 81.1 kg (178 lb 14.4 oz), SpO2 96 %. No results found.  Recent Labs  06/23/16 0651  WBC 7.3  HGB 13.2  HCT 41.4  PLT 284    Recent Labs  06/23/16 0651  NA 141  K 3.8  CL 106  GLUCOSE 89  BUN 6  CREATININE 0.63  CALCIUM 9.4   CBG (last 3)  No results for input(s): GLUCAP in the last 72 hours.  Wt Readings from Last 3 Encounters:  06/17/16 81.1 kg (178 lb 14.4 oz)  05/20/16 84.4 kg (186 lb 1.6 oz)    Physical Exam:  Constitutional: She appears well-developed and well-nourished.  HENT: crani site clean,   Eyes: EOMI. No discharge.   Neck: Trach stoma healed    Cardiovascular:RRR Respiratory: CTA bilaterally  GI: Soft. Bowel sounds are normal. PEG stie clean/dry.  Musculoskeletal: No edema. Improved tolerance of PROM in both LUE and LLE Neurological: communicating through single words and using phrases more and more.  Spastic left hemiparesis trace tone LUE and tr to 1/4 LLE. No changes today Moves RUE/RLE freely 5/5  Skin: Skin is warm and dry.  Psychiatric: pleasant and more engaging  Assessment/Plan: 1. 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 Physiatrist is providing close team supervision and 24 hour management of active medical problems listed below. Physiatrist and rehab team continue to assess barriers to discharge/monitor patient progress toward functional and medical goals.  Function:  Bathing Bathing position   Position: Shower  Bathing parts Body parts bathed by  patient: Left arm, Chest, Abdomen, Front perineal area, Left upper leg, Right upper leg, Right arm, Left lower leg Body parts bathed by helper: Buttocks, Back, Right lower leg  Bathing assist Assist Level: Touching or steadying assistance(Pt > 75%)      Upper Body Dressing/Undressing Upper body dressing   What is the patient wearing?: Pull over shirt/dress     Pull over shirt/dress - Perfomed by patient: Thread/unthread right sleeve, Put head through opening, Pull shirt over trunk Pull over shirt/dress - Perfomed by helper: Thread/unthread left sleeve Button up shirt - Perfomed by patient: Thread/unthread right sleeve Button up shirt - Perfomed by helper: Thread/unthread left sleeve, Pull shirt around back, Button/unbutton shirt    Upper body assist Assist Level:  (Max A)      Lower Body Dressing/Undressing Lower body dressing   What is the patient wearing?: Pants, Socks, Shoes     Pants- Performed by patient: Thread/unthread right pants leg Pants- Performed by helper: Thread/unthread left pants leg, Pull pants up/down   Non-skid slipper socks- Performed by helper: Don/doff left sock   Socks - Performed by helper: Don/doff right sock, Don/doff left sock Shoes - Performed by patient: Don/doff right shoe, Don/doff left shoe Shoes - Performed by helper: Fasten left, Don/doff left shoe, Fasten right, Don/doff right shoe   AFO - Performed by helper: Don/doff left AFO      Lower body assist Assist for lower body dressing:  (Max A)      Toileting Toileting Toileting activity did not  occur: Refused   Toileting steps completed by helper: Adjust clothing prior to toileting, Performs perineal hygiene, Adjust clothing after toileting Toileting Assistive Devices: Other (comment) (stedy)  Toileting assist Assist level: Two helpers   Transfers Chair/bed transfer   Chair/bed transfer method: Squat pivot Chair/bed transfer assist level: Maximal assist (Pt 25 - 49%/lift and  lower) Chair/bed transfer assistive device: Armrests Mechanical lift: Stedy   Locomotion Ambulation Ambulation activity did not occur: Safety/medical concerns   Max distance: 60 ft Assist level: Moderate assist (Pt 50 - 74%)   Wheelchair   Type: Manual Max wheelchair distance: 150 ft Assist Level: Supervision or verbal cues  Cognition Comprehension Comprehension assist level: Understands basic 75 - 89% of the time/ requires cueing 10 - 24% of the time  Expression Expression assist level: Expresses basic 75 - 89% of the time/requires cueing 10 - 24% of the time. Needs helper to occlude trach/needs to repeat words.  Social Interaction Social Interaction assist level: Interacts appropriately 75 - 89% of the time - Needs redirection for appropriate language or to initiate interaction.  Problem Solving Problem solving assist level: Solves basic 75 - 89% of the time/requires cueing 10 - 24% of the time  Memory Memory assist level: Recognizes or recalls 75 - 89% of the time/requires cueing 10 - 24% of the time     Medical Problem List and Plan: 1.  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  -cont CIR-PT, OT, SLP--team conference today  -continue  helmet when OOB  -Dr. Lionel DecemberFargen, [facility]---cranioplasty now scheduled for 1/15.   2.  DVT Prophylaxis/Anticoagulation: Mechanical: Sequential compression devices, below knee Bilateral lower extremities. LLE dopplers 12/26 negative for DVT 3. Headaches/Pain Management: controlled at present  -extremity pain still appears to be neuropathic  -maintain gabapentin at 600mg  TID          -continue hydrocodone at 10mg  prn  -consider TCA at bedtime  -overall appears improved 4. Mood: LCSW to follow for evaluation and support when appropriate.   -depression has been an issue  -zoloft at 100mg  daily    -adderall 20mg  XR added for initiation/engagement (was also using at home)   5.  Neuropsych: This patient is not capable of making decisions on her own behalf. 6. Skin/Wound Care: routine pressure relief measures.  7. Fluids/Electrolytes/Nutrition:  D2/nectars per SLP-   -eating better  -consider PEG removal later this week.   -will change all meds to pill form today 8. VDRF:  trach stoma closed 9. New onset seizures: Continue Keppra bid.  10. Spasticity/Neuropathy LUE/LLE: see above  -baclofen  20 TID    -LLE ROM, splinting     11. Hypotension/bradycardia  Improved  .  LOS (Days) 34 A FACE TO FACE EVALUATION WAS PERFORMED  Faith RogueSWARTZ,Arohi Salvatierra T, MD 06/23/2016 1:32 PM

## 2016-06-23 NOTE — Progress Notes (Addendum)
Pt reports that wants medications orally.  I understood that medications were given in liquid form prior to today and pt requested that they be changed back. Discussed with speech the need for medications to be crushed.  Informed pt that will have to crush medications at this point and try putting in different things such as yogurt, pudding, As, or even ice cream. Pt agreed to apple sauce, offered water and pt refused, stated that hates water and will not drink. Pt is drinking nectar thick fluids for hydration.

## 2016-06-24 ENCOUNTER — Inpatient Hospital Stay (HOSPITAL_COMMUNITY): Payer: Managed Care, Other (non HMO) | Admitting: Occupational Therapy

## 2016-06-24 ENCOUNTER — Inpatient Hospital Stay (HOSPITAL_COMMUNITY): Payer: Managed Care, Other (non HMO) | Admitting: Speech Pathology

## 2016-06-24 ENCOUNTER — Inpatient Hospital Stay (HOSPITAL_COMMUNITY): Payer: Managed Care, Other (non HMO) | Admitting: Physical Therapy

## 2016-06-24 LAB — URINE CULTURE: Culture: >=100000 — AB

## 2016-06-24 NOTE — Progress Notes (Signed)
Speech Language Pathology Daily Session Notes  Patient Details  Name: Cheryl ClinesLaura Gibbs MRN: [id number] Date of Birth: 02/28/1991  Today's Date: 06/24/2016  Session 1: SLP Individual Time: [id number] SLP Individual Time Calculation (min): 30 min   Session 2: SLP Individual Time: 1130-1210 SLP Individual Time Calculation (min): 40 min  Short Term Goals: Week 5: SLP Short Term Goal 1 (Week 5): Patient will demonstrate basic problem solving with Min A verbal cues.  SLP Short Term Goal 2 (Week 5): Patient will recall new, daily information with Min A multimodal cues.  SLP Short Term Goal 3 (Week 5): Patient will verbally express her wants/needs at the phrase level with Min A verbal cues to self-monitor and correct errors.  SLP Short Term Goal 4 (Week 5): Patient will demonstrate 90% intelligibility at the phrase level with Min A multimodal cues.  SLP Short Term Goal 5 (Week 5): Patient will consume current diet with minimal overt s/s of aspiration and Mod I for use of swallowing compensatory strategies  SLP Short Term Goal 6 (Week 5): Patient will consume trials of thin liquids with minimal overt s/s of aspiration and demonstrate a timely swallow initiation (less than 5 seconds) in 75% of opportunities with Min A verbal cues.   Skilled Therapeutic Interventions:  Session 1: Skilled treatment session focused on cognitive goals. Upon arrival, patient was supine in bed and requested to use the commode. SLP facilitated session by providing Min A verbal cues for problem solving and sequencing during [facility]. Patient verbally expressed her wants/needs with extra time and Min A verbal cues to self-monitor and correct errors. Patient left supine in bed at end of session. Continue with current plan of care.   Session 2: Skilled treatment session focused on dysphagia and speech goals. SLP facilitated session by providing skilled observation with upgraded tray of Dys. 3 textures. Patient demonstrated  efficient mastication with minimal oral residue and anterior spillage that patient self-monitored and corrected with supervision verbal cues. Recommend patient upgrade to Dys. 3 textures. SLP also facilitated session by providing Min A verbal cues for patient to self-monitor and correct verbal errors at the phrase level. Patient left upright in bed with all needs within reach. Continue with current plan of care.   Function:  Eating Eating   Modified Consistency Diet: Yes Eating Assist Level: Set up assist for;Supervision or verbal cues   Eating Set Up Assist For: Opening containers       Cognition Comprehension Comprehension assist level: Understands basic 75 - 89% of the time/ requires cueing 10 - 24% of the time  Expression Expression assistive device: Communication board Expression assist level: Expresses basic 75 - 89% of the time/requires cueing 10 - 24% of the time. Needs helper to occlude trach/needs to repeat words.  Social Interaction Social Interaction assist level: Interacts appropriately 75 - 89% of the time - Needs redirection for appropriate language or to initiate interaction.  Problem Solving Problem solving assist level: Solves basic 75 - 89% of the time/requires cueing 10 - 24% of the time  Memory Memory assist level: Recognizes or recalls 75 - 89% of the time/requires cueing 10 - 24% of the time    Pain No reports of pain  Therapy/Group: Individual Therapy  Angas Isabell 06/24/2016, 3:07 PM

## 2016-06-24 NOTE — Patient Care Conference (Signed)
Inpatient RehabilitationTeam Conference and Plan of Care Update Date: 06/23/2016   Time: 2:30 PM    Patient Name: Cheryl Gibbs      Medical Record Number: [id number]  Date of Birth: 02-15-1991 Sex: Female         Room/Bed: 4W16C/4W16C-01 Payor Info: Payor: Monia Pouch / Plan: Derrek Gu / Product Type: *No Product type* /    Admitting Diagnosis: Postpartum  Admit Date/Time:  05/20/2016  5:38 PM Admission Comments: No comment available   Primary Diagnosis:  Hemorrhage, intracranial (HCC) Principal Problem: Hemorrhage, intracranial (HCC)  Patient Active Problem List   Diagnosis Date Noted  . Posttraumatic stress disorder   . Adjustment disorder with depressed mood   . Hypotension   . Bradycardia   . Hemorrhage, intracranial (HCC) 05/20/2016  . Global aphasia   . Spastic hemiparesis affecting nondominant side (HCC)   . Vascular headache   . Dysphagia, post-stroke   . Tracheostomy status (HCC)   . Seizure (HCC)   . Neuropathy (HCC)   . Nonverbal     Expected Discharge Date: Expected Discharge Date: 07/09/16  Team Members Present: Physician leading conference: Dr. Faith Rogue Social Worker Present: Amada Jupiter, LCSW Nurse Present: Carmie End, RN PT Present: Aleda Grana, PT;Other (comment) Judieth Keens, PT) OT Present: Roney Mans, OT;Ardis Rowan, COTA SLP Present: Feliberto Gottron, SLP PPS Coordinator present : Tora Duck, RN, CRRN     Current Status/Progress Goal Weekly Team Focus  Medical   speaking more. taking in better po. pain improved also  see prior. increase enagement of left side  nutrition, med consolidation. removal of PEG later this week   Bowel/Bladder   Contient of bowel/bladder. LBM 1/27. Can be incontinent of bladder at night.  To be continent of bowel/bladder both days and nights  Assess bowel/bladder function and to time toilet patient q 2hrs   Swallow/Nutrition/ Hydration   Dys. 2 textures with nectar-thick liquids, water protocol   Min A with  least restrictive diet  trials of upgraded textures/liquids, possible repeat MBS   ADL's   mod A squat pivot transfers and sit to stand to hemiwalker, min A UB self care, max to total LB self care and toileting  mod A standing balance, tub transfer, toilet transfer, LB dressing; min A bathing and UB dressing; max A toileting (goals may need to be downgraded at next progress report cycle)  ADL retraining, balance, trunk control, LUE PROM, activity tolerance, cognitive activities, pt/family education   Mobility   max assist for squat pivot transfers, min assit bed mobility in hospital bed, supervision w/c mobility in controlled environment but assistance with ramp negotiation  downgraded to max assist overall  pt education, transfers, w/c mobility, L attention   Communication   Overall Min-Mod A for verbal expression  Mod I for multimodal communication  self-monitoring and correcting verbal errors, functional communication, family education    Safety/Cognition/ Behavioral Observations  Min A  Min A  problem solving, awareness, family education    Pain   Complained of pain of 4/10 Hycet PRN given  <2  assess and treat pain q shift and as needed   Skin   Peg sit, clean, dry and intact  no skin breakdown/infection  Assess and address any skin issues q shift and as needed      *See Care Plan and progress notes for long and short-term goals.  Barriers to Discharge: dense left hemiparesis, pain     Possible Resolutions to Barriers:  better pain control, goal oriented  behavior. family education    Discharge Planning/Teaching Needs:  Mother confirms plan for pt to come home with her and she will provide/ coordinate 24/7 physical assistance.  Mother attended one 1/2 day of therapy last week and to be in this afternoon to continue.   Team Discussion:  Improving overall!  Verbally improved and rarely using wipe-off board to communicate. meds being changed to be given in pill form.  ST plans to  trial D3 diet tomorrow and would like to repeat MBS next week.  Poor body awareness still but getting better.  More engaged with therapy and more motivated this week.  Mod-max squat-pivot transfers. Mother beginning family ed but team feels strongly that an extension of pt's stay will be very beneficial to family and pt success in the home.  At this point, [facility].  Team would like to keep pt until that date.  Revisions to Treatment Plan:  Change in d/c date to 2/15.   Continued Need for Acute Rehabilitation Level of Care: The patient requires daily medical management by a physician with specialized training in physical medicine and rehabilitation for the following conditions: Daily direction of a multidisciplinary physical rehabilitation program to ensure safe treatment while eliciting the highest outcome that is of practical value to the patient.: Yes Daily medical management of patient stability for increased activity during participation in an intensive rehabilitation regime.: Yes Daily analysis of laboratory values and/or radiology reports with any subsequent need for medication adjustment of medical intervention for : Neurological problems;Wound care problems;Nutritional problems;Post surgical problems  Cheryl Gibbs 06/24/2016, 9:11 AM

## 2016-06-24 NOTE — Progress Notes (Signed)
[place] PHYSICAL MEDICINE & REHABILITATION     PROGRESS NOTE    Subjective/Complaints: Staff going back and forth with Shamecca over med amin. She doesn't want meds crushed and at times ahs wanted meds thru tube. She told me this morning she is willing to take meds whole with applesauce but would prefer to take with nectar liquids.  ROS: pt denies nausea, vomiting, diarrhea, cough, shortness of breath or chest pain    Objective: Vital Signs: Blood pressure (!) 93/49, pulse 60, temperature 98.3 F (36.8 C), temperature source Oral, resp. rate 17, height 5\' 4"  (1.626 m), weight 81.1 kg (178 lb 14.4 oz), SpO2 96 %. No results found.  Recent Labs  06/23/16 0651  WBC 7.3  HGB 13.2  HCT 41.4  PLT 284    Recent Labs  06/23/16 0651  NA 141  K 3.8  CL 106  GLUCOSE 89  BUN 6  CREATININE 0.63  CALCIUM 9.4   CBG (last 3)  No results for input(s): GLUCAP in the last 72 hours.  Wt Readings from Last 3 Encounters:  06/17/16 81.1 kg (178 lb 14.4 oz)  05/20/16 84.4 kg (186 lb 1.6 oz)    Physical Exam:  Constitutional: She appears well-developed and well-nourished.  HENT: crani site clean,   Eyes: EOMI. No discharge.   Neck: Trach stoma healed    Cardiovascular:RRR Respiratory: CTA bilaterally  GI: Soft. Bowel sounds are normal. PEG stie clean/dry.  Musculoskeletal: No edema. Improved tolerance of PROM in both LUE and LLE Neurological: language non-fluent but able to communicate more effectively now with short phrases.  Spastic left hemiparesis trace tone LUE and tr to 1/4 LLE. Stable. Left hemisensory deficits Moves RUE/RLE freely 5/5  Skin: Skin is warm and dry.  Psychiatric: pleasant and more engaging  Assessment/Plan: 1. 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 Physiatrist is providing close team supervision and 24 hour management of  active medical problems listed below. Physiatrist and rehab team continue to assess barriers to discharge/monitor patient progress toward functional and medical goals.  Function:  Bathing Bathing position   Position: Shower  Bathing parts Body parts bathed by patient: Left arm, Chest, Abdomen, Front perineal area, Left upper leg, Right upper leg, Right arm, Left lower leg Body parts bathed by helper: Buttocks, Back, Right lower leg  Bathing assist Assist Level: Touching or steadying assistance(Pt > 75%)      Upper Body Dressing/Undressing Upper body dressing   What is the patient wearing?: Pull over shirt/dress     Pull over shirt/dress - Perfomed by patient: Thread/unthread right sleeve, Put head through opening, Pull shirt over trunk Pull over shirt/dress - Perfomed by helper: Thread/unthread left sleeve Button up shirt - Perfomed by patient: Thread/unthread right sleeve Button up shirt - Perfomed by helper: Thread/unthread left sleeve, Pull shirt around back, Button/unbutton shirt    Upper body assist Assist Level:  (Max A)      Lower Body Dressing/Undressing Lower body dressing   What is the patient wearing?: Pants, Socks, Shoes     Pants- Performed by patient: Thread/unthread right pants leg Pants- Performed by helper: Thread/unthread left pants leg, Pull pants up/down   Non-skid slipper socks- Performed by helper: Don/doff left sock   Socks - Performed by helper: Don/doff right sock, Don/doff left sock Shoes - Performed by patient: Don/doff right shoe, Don/doff left shoe Shoes - Performed by helper:  Fasten left, Don/doff left shoe, Fasten right, Don/doff right shoe   AFO - Performed by helper: Don/doff left AFO      Lower body assist Assist for lower body dressing:  (Max A)      Toileting Toileting Toileting activity did not occur: Refused   Toileting steps completed by helper: Adjust clothing prior to toileting, Adjust clothing after toileting, Performs  perineal hygiene Toileting Assistive Devices: Other (comment)  Toileting assist Assist level: Two helpers   Transfers Chair/bed transfer   Chair/bed transfer method: Squat pivot Chair/bed transfer assist level: Maximal assist (Pt 25 - 49%/lift and lower) Chair/bed transfer assistive device: Armrests Mechanical lift: Stedy   Locomotion Ambulation Ambulation activity did not occur: Safety/medical concerns   Max distance: 60 ft Assist level: Moderate assist (Pt 50 - 74%)   Wheelchair   Type: Manual Max wheelchair distance: 150 ft Assist Level: Supervision or verbal cues  Cognition Comprehension Comprehension assist level: Understands basic 75 - 89% of the time/ requires cueing 10 - 24% of the time  Expression Expression assist level: Expresses basic 75 - 89% of the time/requires cueing 10 - 24% of the time. Needs helper to occlude trach/needs to repeat words.  Social Interaction Social Interaction assist level: Interacts appropriately 75 - 89% of the time - Needs redirection for appropriate language or to initiate interaction.  Problem Solving Problem solving assist level: Solves basic 75 - 89% of the time/requires cueing 10 - 24% of the time  Memory Memory assist level: Recognizes or recalls 75 - 89% of the time/requires cueing 10 - 24% of the time     Medical Problem List and Plan: 1.  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  -cont CIR-PT, OT, SLP--   -continue  helmet when OOB  -Dr. Lionel December, [facility]---cranioplasty now scheduled for 2/15.   -pt making gains with swallowing/language/mobility. Will use additional time to address these area and for further family ed.  2.  DVT Prophylaxis/Anticoagulation: Mechanical: Sequential compression devices, below knee Bilateral lower extremities. LLE dopplers 12/26 negative for DVT 3. Headaches/Pain Management: controlled at present  -extremity pain is  neuropathic  -maintain gabapentin at 600mg  TID          -continue hydrocodone at 10mg  prn  -consider TCA at bedtime  -overall appears improved 4. Mood: LCSW to follow for evaluation and support when appropriate.   -depression has been an issue  -zoloft at 100mg  daily    -adderall 20mg  XR added for initiation/engagement (was also using at home)   5. Neuropsych: This patient is not capable of making decisions on her own behalf. 6. Skin/Wound Care: routine pressure relief measures.  7. Fluids/Electrolytes/Nutrition:  D2/nectars per SLP-   -eating more  -consider PEG removal soon  -would like for medication "question" to be settled a bit more b/f we remove PEG.   - all meds   pill form with applesauce 8. VDRF:  trach stoma closed 9. New onset seizures: Continue Keppra bid.  10. Spasticity/Neuropathy LUE/LLE: see above  -baclofen  20 TID    -LLE ROM, splinting     11. Hypotension/bradycardia  Improved  .  LOS (Days) 35 A FACE TO FACE EVALUATION WAS PERFORMED  Ranelle Oyster, MD 06/24/2016 9:22 AM

## 2016-06-24 NOTE — Progress Notes (Signed)
Physical Therapy Note  Patient Details  Name: Cheryl Gibbs MRN: [id number] Date of Birth: 07/15/1990 Today's Date: 06/24/2016    Time: 1300-1358 58 minutes  1:1 No c/o pain.  Gait with hemi walker 10' x 2, 15' with max A for Lt LE advancement and stance control, manual facilitation to prevent Lt knee buckling/locking and for wt shifts.  Pt with lability due to fact that Lt LE is not strong, PT provided encouragement.  Car transfer and furniture transfers with hemi walker to Lt side with max A, transfers without hemi walker to Rt side with min/mod A.  Pt continues with difficulty sequencing and motor planning movements but is improving with repetition of functional tasks.   Daris Harkins 06/24/2016, 2:25 PM

## 2016-06-24 NOTE — Progress Notes (Signed)
Occupational Therapy Session Note  Patient Details  Name: Cheryl Gibbs MRN: [id number] Date of Birth: 1991/04/22  Today's Date: 06/24/2016 OT Individual Time: 0900-1015 OT Individual Time Calculation (min): 75 min    Short Term Goals: Week 4:  OT Short Term Goal 1 (Week 4): Pt will complete BSC transfers using a squat pivot with mod A to the left and right. OT Short Term Goal 2 (Week 4): Pt will stabilize balance in standing with hemiwalker with min A to allow caregiver to assist with clothing management. OT Short Term Goal 3 (Week 4): Pt will bathe with min A in the shower. OT Short Term Goal 4 (Week 4): Pt will perform self ROM of her LUE with mod cues.  Skilled Therapeutic Interventions/Progress Updates:    Pt seen for ADL retraining with a focus on postural control in sitting, sit to stand.  Today she used padded tub bench vs open seat tub bench and was able to maintain postural control. She had one LOB to L but quickly grabbed grab bar with R hand demonstrating improved protective responses.  Worked on sit to stand with R hand on bed rail with improved postural control but poor standing tolerance. Pt only tolerates 20-30 sec at a time and she will need to focus on that these next 2 weeks. Pt in wc with all needs met. Quick release belt on.   Therapy Documentation Precautions:  Precautions Precautions: Fall Precaution Comments: helmet when OOB Restrictions Weight Bearing Restrictions: No    Vital Signs: Therapy Vitals Temp: 98.3 F (36.8 C) Temp Source: Oral Pulse Rate: 60 Resp: 17 BP: (!) 93/49 Patient Position (if appropriate): Lying Oxygen Therapy SpO2: 96 % O2 Device: Not Delivered Pain: no c/o pain, except in LLE during standing and LUE during passive lifting   ADL: ADL ADL Comments: refer to navigator  See Function Navigator for Current Functional Status.   Therapy/Group: Individual Therapy  [name] 06/24/2016, 8:35 AM

## 2016-06-24 NOTE — Progress Notes (Signed)
Nutrition Follow-up  DOCUMENTATION CODES:   Obesity unspecified  INTERVENTION:  Continue free water flushes of 200 ml QID per tube.   Encourage adequate PO intake.   NUTRITION DIAGNOSIS:   Inadequate oral intake related to inability to eat as evidenced by NPO status; diet advanced; po improved  GOAL:   Patient will meet greater than or equal to 90% of their needs; met  MONITOR:   PO intake, Supplement acceptance, Diet advancement, Labs, Weight trends, Skin, I & O's  REASON FOR ASSESSMENT:   Consult Enteral/tube feeding initiation and management  ASSESSMENT:   26 year old female who was 10 days post partum and  was transferred to [facility] from OSH on 04/11/16  after being found unresponsive by husband.  Patient in bathtub--not submerged and activity seizing. 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  She underwent cerebral angiogram and decline neurologically shortly after admission to ICU. She was taken to OR emergently for right craniotomy for evacuation of IPH and obliteration of ruptured lenticulostriate aneurysm and placement of EVD. Trach and PEG were placed on 11/22.  She was admitted to [facility]) on 05/01/16. She is non verbal.  Diet has been advanced on a dysphagia 3 diet with nectar thick liquids. PO intake has been mostly 100%. Tube feeding orders have been discontinued. Pt currently has free water flushes ordered via PEG. Pt encouraged to eat her foods at meals. Labs and medications reviewed.   Diet Order:  DIET DYS 3 Room service appropriate? Yes; Fluid consistency: Nectar Thick  Skin:  Reviewed, no issues  Last BM:  1/27  Height:   Ht Readings from Last 1 Encounters:  05/20/16 '5\' 4"'  (1.626 m)    Weight:   Wt Readings from Last 1 Encounters:   06/17/16 178 lb 14.4 oz (81.1 kg)    Ideal Body Weight:  54.5 kg  BMI:  Body mass index is 30.71 kg/m.  Estimated Nutritional Needs:   Kcal:  1850-2050  Protein:  90-105 grams  Fluid:  1.8 - 2 L/day  EDUCATION NEEDS:   No education needs identified at this time  Corrin Parker, MS, RD, LDN Pager # [phone] After hours/ weekend pager # [phone]

## 2016-06-25 ENCOUNTER — Inpatient Hospital Stay (HOSPITAL_COMMUNITY): Payer: Managed Care, Other (non HMO) | Admitting: Speech Pathology

## 2016-06-25 ENCOUNTER — Inpatient Hospital Stay (HOSPITAL_COMMUNITY): Payer: Managed Care, Other (non HMO) | Admitting: Occupational Therapy

## 2016-06-25 ENCOUNTER — Inpatient Hospital Stay (HOSPITAL_COMMUNITY): Payer: Managed Care, Other (non HMO) | Admitting: Physical Therapy

## 2016-06-25 NOTE — Progress Notes (Signed)
Social Work Patient ID: Savahna Casados, female   DOB: [birth year], 26 y.o.   MRN: [id number]   Met with pt and her mother following team conference.  Both aware and agreeable to team recommendation to extend LOS and new targeted d/c date of 2/15.  Plan at this point is for pt to d/c from CIR and admit to [facility] for flap surgery.  Pt and mother feel she is making good progress and pleased with extension.   Discussed with mother the need for several more days of "hands on" training needed with her.  She seems a little surprised at this but agreeable.  Also, discussed the need to train any other caregivers yet mother still doesn't identify anyone in particular.  She states now, "It's really going to just be me."  Will confirm with mother today the additional days she plans to be here to complete education.    Willmar Stockinger, LCSW

## 2016-06-25 NOTE — Progress Notes (Signed)
Speech Language Pathology Daily Session Note  Patient Details  Name: Cheryl ClinesLaura Sabater MRN: [id number] Date of Birth: 09/21/1990  Today's Date: 06/25/2016 SLP Individual Time: 0730-0800 SLP Individual Time Calculation (min): 30 min  Short Term Goals: Week 5: SLP Short Term Goal 1 (Week 5): Patient will demonstrate basic problem solving with Min A verbal cues.  SLP Short Term Goal 2 (Week 5): Patient will recall new, daily information with Min A multimodal cues.  SLP Short Term Goal 3 (Week 5): Patient will verbally express her wants/needs at the phrase level with Min A verbal cues to self-monitor and correct errors.  SLP Short Term Goal 4 (Week 5): Patient will demonstrate 90% intelligibility at the phrase level with Min A multimodal cues.  SLP Short Term Goal 5 (Week 5): Patient will consume current diet with minimal overt s/s of aspiration and Mod I for use of swallowing compensatory strategies  SLP Short Term Goal 6 (Week 5): Patient will consume trials of thin liquids with minimal overt s/s of aspiration and demonstrate a timely swallow initiation (less than 5 seconds) in 75% of opportunities with Min A verbal cues.   Skilled Therapeutic Interventions: Skilled treatment session focused on dysphagia and speech goals. Patient consumed breakfast meal of Dys. 3 textures with nectar-thick liquids with Mod I for use of swallowing compensatory strategies. Patient also verbally expressed wants/needs at the phrase level with Min A verbal cues. Patient left upright in bed with all needs within reach. Continue with current plan of care.      Function:  Eating Eating Eating activity did not occur: Safety/medical concerns Modified Consistency Diet: Yes Eating Assist Level: Set up assist for;Supervision or verbal cues   Eating Set Up Assist For: Opening containers       Cognition Comprehension Comprehension assist level: Understands basic 75 - 89% of the time/ requires cueing 10 - 24% of the time   Expression Expression assistive device: Communication board Expression assist level: Expresses basic 75 - 89% of the time/requires cueing 10 - 24% of the time. Needs helper to occlude trach/needs to repeat words.  Social Interaction Social Interaction assist level: Interacts appropriately 75 - 89% of the time - Needs redirection for appropriate language or to initiate interaction.  Problem Solving Problem solving assist level: Solves basic 75 - 89% of the time/requires cueing 10 - 24% of the time  Memory Memory assist level: Recognizes or recalls 75 - 89% of the time/requires cueing 10 - 24% of the time    Pain No/Denies Pain   Therapy/Group: Individual Therapy  Valinda Fedie 06/25/2016, 3:56 PM

## 2016-06-25 NOTE — Progress Notes (Signed)
[facility] PHYSICAL MEDICINE & REHABILITATION     PROGRESS NOTE    Subjective/Complaints: Apparently taking meds in applesauce. No complaints. Eating breakfast with therapy. Wants bacon and tomatoes!  ROS: pt denies nausea, vomiting, diarrhea, cough, shortness of breath or chest pain     Objective: Vital Signs: Blood pressure 100/63, pulse 64, temperature 98.2 F (36.8 C), temperature source Oral, resp. rate 18, height 5\' 4"  (1.626 m), weight 81.1 kg (178 lb 14.4 oz), SpO2 97 %. No results found.  Recent Labs  06/23/16 0651  WBC 7.3  HGB 13.2  HCT 41.4  PLT 284    Recent Labs  06/23/16 0651  NA 141  K 3.8  CL 106  GLUCOSE 89  BUN 6  CREATININE 0.63  CALCIUM 9.4   CBG (last 3)  No results for input(s): GLUCAP in the last 72 hours.  Wt Readings from Last 3 Encounters:  06/17/16 81.1 kg (178 lb 14.4 oz)  05/20/16 84.4 kg (186 lb 1.6 oz)    Physical Exam:  Constitutional: She appears well-developed and well-nourished.  HENT: crani site clean,   Eyes: EOMI. No discharge.   Neck: Trach stoma healed    Cardiovascular:RRR Respiratory: CTA bilaterally  GI: Soft. Bowel sounds are normal. PEG stie clean/dry.  Musculoskeletal: No edema. Improved tolerance of PROM in both LUE and LLE Neurological: language non-fluent  with short phrases.  Spastic left hemiparesis trace tone LUE and tr to 1/4 LLE. Stable. Left hemisensory deficits--no changes Moves RUE/RLE freely 5/5  Skin: Skin is warm and dry.  Psychiatric: pleasant and more engaging now  Assessment/Plan: 1. 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 Physiatrist is providing close team supervision and 24 hour management of active medical problems listed below. Physiatrist and rehab team continue to assess barriers to discharge/monitor patient progress toward functional and medical  goals.  Function:  Bathing Bathing position   Position: Shower  Bathing parts Body parts bathed by patient: Left arm, Chest, Abdomen, Front perineal area, Left upper leg, Right upper leg, Right arm, Left lower leg Body parts bathed by helper: Buttocks, Back, Right lower leg  Bathing assist Assist Level: Touching or steadying assistance(Pt > 75%)      Upper Body Dressing/Undressing Upper body dressing   What is the patient wearing?: Pull over shirt/dress     Pull over shirt/dress - Perfomed by patient: Thread/unthread right sleeve, Put head through opening Pull over shirt/dress - Perfomed by helper: Thread/unthread left sleeve, Pull shirt over trunk Button up shirt - Perfomed by patient: Thread/unthread right sleeve Button up shirt - Perfomed by helper: Thread/unthread left sleeve, Pull shirt around back, Button/unbutton shirt    Upper body assist Assist Level:  (Max A)      Lower Body Dressing/Undressing Lower body dressing   What is the patient wearing?: Pants, Socks, Shoes     Pants- Performed by patient: Thread/unthread right pants leg Pants- Performed by helper: Thread/unthread left pants leg, Pull pants up/down   Non-skid slipper socks- Performed by helper: Don/doff left sock Socks - Performed by patient: Don/doff right sock Socks - Performed by helper: Don/doff left sock Shoes - Performed by patient: Don/doff right shoe Shoes - Performed by helper: Don/doff left shoe, Fasten right, Fasten left   AFO - Performed by helper: Don/doff left AFO      Lower body assist Assist for lower body dressing:  (Max A)  Toileting Toileting Toileting activity did not occur: Refused   Toileting steps completed by helper: Adjust clothing prior to toileting, Performs perineal hygiene, Adjust clothing after toileting Toileting Assistive Devices: Toilet aid  Toileting assist Assist level: Two helpers   Transfers Chair/bed transfer   Chair/bed transfer method: Squat  pivot Chair/bed transfer assist level: Maximal assist (Pt 25 - 49%/lift and lower) Chair/bed transfer assistive device: Armrests Mechanical lift: Stedy   Locomotion Ambulation Ambulation activity did not occur: Safety/medical concerns   Max distance: 60 ft Assist level: Moderate assist (Pt 50 - 74%)   Wheelchair   Type: Manual Max wheelchair distance: 150 ft Assist Level: Supervision or verbal cues  Cognition Comprehension Comprehension assist level: Understands basic 75 - 89% of the time/ requires cueing 10 - 24% of the time  Expression Expression assist level: Expresses basic 75 - 89% of the time/requires cueing 10 - 24% of the time. Needs helper to occlude trach/needs to repeat words.  Social Interaction Social Interaction assist level: Interacts appropriately 75 - 89% of the time - Needs redirection for appropriate language or to initiate interaction.  Problem Solving Problem solving assist level: Solves basic 75 - 89% of the time/requires cueing 10 - 24% of the time  Memory Memory assist level: Recognizes or recalls 75 - 89% of the time/requires cueing 10 - 24% of the time     Medical Problem List and Plan: 1.  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  -cont CIR-PT, OT, SLP--   -continue  helmet when OOB  -Dr. Lionel DecemberFargen, [facility]---cranioplasty now scheduled for 2/15.   -family ed in progress 2.  DVT Prophylaxis/Anticoagulation: Mechanical: Sequential compression devices, below knee Bilateral lower extremities. LLE dopplers 12/26 negative for DVT 3. Headaches/Pain Management: controlled at present  -extremity pain is neuropathic  -maintain gabapentin at 600mg  TID          -continue hydrocodone at 10mg  prn  -consider TCA at bedtime  -overall appears improved 4. Mood: LCSW to follow for evaluation and support when appropriate.   -depression has been an issue  -zoloft at 100mg  daily    -adderall 20mg  XR  added for initiation/engagement (was also using at home)   5. Neuropsych: This patient is not capable of making decisions on her own behalf. 6. Skin/Wound Care: routine pressure relief measures.  7. Fluids/Electrolytes/Nutrition:  D2/nectars per SLP-   -eating more  -consider PEG removal if consistently taking meds  - all meds   pill form with applesauce 8. VDRF:  trach stoma closed 9. New onset seizures: Continue Keppra bid.  10. Spasticity/Neuropathy LUE/LLE: see above  -baclofen  20 TID    -LLE ROM, splinting     11. Hypotension/bradycardia  Improved  .  LOS (Days) 36 A FACE TO FACE EVALUATION WAS PERFORMED  Ranelle OysterSWARTZ,Brittanny Levenhagen T, MD 06/25/2016 8:57 AM

## 2016-06-25 NOTE — Progress Notes (Addendum)
Physical Therapy Session Note  Patient Details  Name: Cheryl Gibbs MRN: [id number] Date of Birth: [birth year]  Today's Date: 06/25/2016 PT Individual Time: [phone] and [id number] PT Individual Time Calculation (min): 56 min and 39 min  Short Term Goals: Week 5:  PT Short Term Goal 1 (Week 5): Pt will sequence squat pivot transfer with min cuing from therapist.  PT Short Term Goal 2 (Week 5): Pt will propel w/c with min cuing to attend to L.  Skilled Therapeutic Interventions/Progress Updates:  Treatment 1: Pt received in bed & agreeable to tx, noting pain in LLE; RN & nursing student made aware & meds administered. No family present for caregiver training but pt reports her mother, aunt & grandfather can assist her at d/c. Pt transferred supine>sitting EOB with min assist at end of activity only to shift buttocks closer to EOB. Therapist donned pt's helmet, aircast, & B shoes total assist for time management. Pt requires assistance from therapist to push LLE into shoe as pt is limited by tight heel cord. Pt completed squat pivot transfer bed>w/c with max assist +1 with multimodal cuing for weight shifting and technique. While nursing student administered meds pt engaged in conversation with therapist with task focusing on L attention. Pt propelled w/c room>ortho gym with R hemi technique and supervision with occasional cuing to attend to L to negotiate doorways. Pt noted feeling "drunk" with L eye causing this feeling; therapist did not observe any nystagmus. Therapist notified scheduler and OT of recommendation for vestibular eval. Pt completed car transfer at simulated SUV height (30") via stand pivot with therapist facilitating pivot and blocking LLE to prevent buckling as pt transferred RLE closer to car. Pt requires max assist for support and to transfer back onto seat as pt is limited in ability 2/2 elevated seat height. Pt would benefit from further practice with car transfers to increase safety and  improve technique. At end of session pt left sitting in w/c in room with QRB donned & all needs within reach.  Treatment 2: Pt received in bathroom with mother Cheryl Gibbs) present for family training. No c/o pain noted during session. Educated pt's mother on recommendation for HHPT and 24 hr supervision following d/c. Pt with continent BM on toilet and transferred sit<>stand with Stedy lift. Pt's mother performed peri-hygiene, donned brief and managed pants with cuing for sequencing & technique as well as physical assistance from therapist. Cheryl Gibbs assisted pt to w/c via stedy lift with supervision from therapist. Session focused on car transfer training in real car; therapist & pt provided demonstration and pt & mother return demonstrated stand pivot w/c<>car. Pt and mother both require heavy verbal cuing for sequencing, technique (blocking LLE, head/hips relationship, scooting back onto seat), and cuing for safety. A second transfer with pt & mother was declined 2/2 pt fatigue & Cheryl Gibbs reporting her back was hurting. Discussed possibility of pt needing to complete car transfer when fatigued at home. At end of session pt left sitting in w/c in room with all needs within reach & QRB donned.  Pt's mother confirms that pt's grandfather & aunt will be able to assist her at home but reports they will not be coming in for caregiver training.   Cheryl Gibbs also reports she may assist pt in/out of car via Stedy lift; pt & mother will benefit from hands on training of this prior to d/c, however, Cheryl Gibbs reports she has not ordered [facility] lift yet.     Therapy Documentation Precautions:  Precautions Precautions: Fall  Precaution Comments: helmet when OOB Restrictions Weight Bearing Restrictions: No   See Function Navigator for Current Functional Status.   Therapy/Group: Individual Therapy  Cheryl MariscalVictoria M [name] 06/25/2016, 3:52 PM

## 2016-06-25 NOTE — Progress Notes (Signed)
Occupational Therapy Session Note  Patient Details  Name: Cheryl Gibbs MRN: [id number] Date of Birth: 1990-09-24  Today's Date: 06/25/2016 OT Individual Time: 1015-1120 OT Individual Time Calculation (min): 65 min    Short Term Goals: Week 4:  OT Short Term Goal 1 (Week 4): Pt will complete BSC transfers using a squat pivot with mod A to the left and right. OT Short Term Goal 2 (Week 4): Pt will stabilize balance in standing with hemiwalker with min A to allow caregiver to assist with clothing management. OT Short Term Goal 3 (Week 4): Pt will bathe with min A in the shower. OT Short Term Goal 4 (Week 4): Pt will perform self ROM of her LUE with mod cues.  Skilled Therapeutic Interventions/Progress Updates:    Pt seen for ADL retraining with a focus on sitting balance, adaptive techniques with dressing, and sequencing for transfers.  Pt used Stedy from w/c to tub bench and then to toilet. Good trunk control on bench and toilet with wide drop arm over toilet.  Mod-max A squat pivot to w/c from toilet as pt moving impulsively with max cues to sequence.  Pt in w/c with all needs met.  Therapy Documentation Precautions:  Precautions Precautions: Fall Precaution Comments: helmet when OOB Restrictions Weight Bearing Restrictions: No    Pain: Pain Assessment Pain Assessment: No/denies pain (only c/o pain in LUE with passive movement)  ADL: ADL ADL Comments: refer to navigator  See Function Navigator for Current Functional Status.   Therapy/Group: Individual Therapy  [place] 06/25/2016, 12:26 PM

## 2016-06-26 ENCOUNTER — Encounter (HOSPITAL_COMMUNITY): Payer: Managed Care, Other (non HMO) | Admitting: Occupational Therapy

## 2016-06-26 ENCOUNTER — Inpatient Hospital Stay (HOSPITAL_COMMUNITY): Payer: Managed Care, Other (non HMO) | Admitting: Occupational Therapy

## 2016-06-26 ENCOUNTER — Inpatient Hospital Stay (HOSPITAL_COMMUNITY): Payer: Managed Care, Other (non HMO) | Admitting: Speech Pathology

## 2016-06-26 ENCOUNTER — Inpatient Hospital Stay (HOSPITAL_COMMUNITY): Payer: Managed Care, Other (non HMO) | Admitting: Physical Therapy

## 2016-06-26 MED ORDER — NITROFURANTOIN MONOHYD MACRO 100 MG PO CAPS
100.0000 mg | ORAL_CAPSULE | Freq: Two times a day (BID) | ORAL | Status: AC
  Administered 2016-06-26 – 2016-06-30 (×10): 100 mg via ORAL
  Filled 2016-06-26 (×11): qty 1

## 2016-06-26 MED ORDER — LEVETIRACETAM 500 MG PO TABS
500.0000 mg | ORAL_TABLET | Freq: Two times a day (BID) | ORAL | Status: DC
  Administered 2016-06-26 – 2016-07-09 (×27): 500 mg via ORAL
  Filled 2016-06-26 (×27): qty 1

## 2016-06-26 NOTE — Progress Notes (Signed)
Speech Language Pathology Daily Session Note  Patient Details  Name: Cheryl Gibbs MRN: [id number] Date of Birth: 05/22/1991  Today's Date: 06/26/2016 SLP Individual Time: 1015-1045 SLP Individual Time Calculation (min): 30 min  Short Term Goals: Week 5: SLP Short Term Goal 1 (Week 5): Patient will demonstrate basic problem solving with Min A verbal cues.  SLP Short Term Goal 2 (Week 5): Patient will recall new, daily information with Min A multimodal cues.  SLP Short Term Goal 3 (Week 5): Patient will verbally express her wants/needs at the phrase level with Min A verbal cues to self-monitor and correct errors.  SLP Short Term Goal 4 (Week 5): Patient will demonstrate 90% intelligibility at the phrase level with Min A multimodal cues.  SLP Short Term Goal 5 (Week 5): Patient will consume current diet with minimal overt s/s of aspiration and Mod I for use of swallowing compensatory strategies  SLP Short Term Goal 6 (Week 5): Patient will consume trials of thin liquids with minimal overt s/s of aspiration and demonstrate a timely swallow initiation (less than 5 seconds) in 75% of opportunities with Min A verbal cues.   Skilled Therapeutic Interventions: Skilled treatment session focused on dysphagia and speech intelligibility goals. SLP received pt from OT. SLP facilitated session by providing Mod I for recall of missing items to complete getting ready in wheelchair. Pt required supervision for use of speech intelligibility strategies to be close to 100% intelligible at the phrase level. Pt consumed trials of thin water without overt s/s of aspiration. Pt was left upright in wheelchair with safety belt in place and all needs within reach. Continue per current plan of care.      Function:  Eating Eating   Modified Consistency Diet: No (Trials of thin liquids with SLP) Eating Assist Level: Set up assist for;Supervision or verbal cues   Eating Set Up Assist For: Opening containers        Cognition Comprehension Comprehension assist level: Understands basic 75 - 89% of the time/ requires cueing 10 - 24% of the time  Expression   Expression assist level: Expresses basic 75 - 89% of the time/requires cueing 10 - 24% of the time. Needs helper to occlude trach/needs to repeat words.  Social Interaction Social Interaction assist level: Interacts appropriately 75 - 89% of the time - Needs redirection for appropriate language or to initiate interaction.  Problem Solving Problem solving assist level: Solves basic 75 - 89% of the time/requires cueing 10 - 24% of the time  Memory Memory assist level: Recognizes or recalls 75 - 89% of the time/requires cueing 10 - 24% of the time    Pain Pain Assessment Pain Score: 2   Therapy/Group: Individual Therapy Cheryl Gibbs B. Cheryl Gibbs, M.S., CCC-SLP Speech-Language Pathologist  Cheryl Gibbs 06/26/2016, 12:41 PM

## 2016-06-26 NOTE — Progress Notes (Signed)
Occupational Therapy Weekly Progress Note  Patient Details  Name: Cheryl Gibbs MRN: [id number] Date of Birth: 17-Jan-1991  Beginning of progress report period: June 17, 2016 End of progress report period: June 26, 2016  Today's Date: 06/26/2016 OT Individual Time: 2549-8264 OT Individual Time Calculation (min): 45 min    Patient has met 2 of 4 short term goals.  Pt has made progress with bathing more independently using long sponge and with working on self ROM of LUE. Her trunk control is also improving well in sitting and partial stand in the [place] lift.  She is not consistent yet with her squat pivot transfers or with standing. This will be our focus over the next week.  Patient continues to demonstrate the following deficits: muscle weakness, decreased cardiorespiratoy endurance, unbalanced muscle activation, decreased visual perceptual skills and hemianopsia, decreased attention to left, decreased awareness, decreased problem solving and decreased memory and decreased sitting balance, decreased standing balance, decreased postural control and hemiplegia and therefore will continue to benefit from skilled OT intervention to enhance overall performance with BADL.  Patient not progressing toward long term goals.  See goal revision..  Plan of care revisions: LTG of tub transfer discontinued as pt's home does not have a safe set up at this time. Her mother may be making some renovations.  LB dressing downgraded from mod to max A.  .  OT Short Term Goals Week 1:  OT Short Term Goal 1 (Week 1): Pt will be able to sit at EOB for 5 minutes with mod A to maintain balance. OT Short Term Goal 1 - Progress (Week 1): Progressing toward goal OT Short Term Goal 2 (Week 1): Pt will tolerate self bathing her LUE. OT Short Term Goal 2 - Progress (Week 1): Met OT Short Term Goal 3 (Week 1): Pt will don a shirt with mod A. OT Short Term Goal 3 - Progress (Week 1): Progressing toward goal OT Short Term Goal  4 (Week 1): Pt will sit to stand with 1 person A with max A. OT Short Term Goal 4 - Progress (Week 1): Met OT Short Term Goal 5 (Week 1): Pt will use R hand for self ROM of L fingers. OT Short Term Goal 5 - Progress (Week 1): Progressing toward goal Week 2:  OT Short Term Goal 1 (Week 2): Pt will be able to sit at EOB for 5 minutes with mod A to maintain balance OT Short Term Goal 1 - Progress (Week 2): Met OT Short Term Goal 2 (Week 2): Pt will don a shirt with mod A. OT Short Term Goal 2 - Progress (Week 2): Progressing toward goal OT Short Term Goal 3 (Week 2): Pt will use R hand for self ROM of L fingers. OT Short Term Goal 3 - Progress (Week 2): Progressing toward goal OT Short Term Goal 4 (Week 2): Pt will transfer onto a BSC with slide board with max A of 1 person.  OT Short Term Goal 4 - Progress (Week 2): Partly met OT Short Term Goal 5 (Week 2): Pt with sit to stand with mod A of 1 during toileting. OT Short Term Goal 5 - Progress (Week 2): Met Week 3:  OT Short Term Goal 1 (Week 3): Pt will be able to complete squat pivot from bed to chair to prepare for LB dressing with mod A of 1. OT Short Term Goal 1 - Progress (Week 3): Partly met (pt can complete transfer to R with mod A  but needs max A to the left) OT Short Term Goal 2 (Week 3): Pt will be able to sit to stand at sink or EOB with min A to prepare for LB dressing.  OT Short Term Goal 2 - Progress (Week 3): Met OT Short Term Goal 3 (Week 3): From sitting EOB, pt will don RLE into pants and pull up to thigh level with min A. OT Short Term Goal 3 - Progress (Week 3): Progressing toward goal OT Short Term Goal 4 (Week 3): Pt will don pullover shirt with mod A. OT Short Term Goal 4 - Progress (Week 3): Met Week 4:  OT Short Term Goal 1 (Week 4): Pt will complete BSC transfers using a squat pivot with mod A to the left and right. OT Short Term Goal 1 - Progress (Week 4): Not met OT Short Term Goal 2 (Week 4): Pt will stabilize  balance in standing with hemiwalker with min A to allow caregiver to assist with clothing management. OT Short Term Goal 2 - Progress (Week 4): Not met OT Short Term Goal 3 (Week 4): Pt will bathe with min A in the shower. OT Short Term Goal 3 - Progress (Week 4): Met OT Short Term Goal 4 (Week 4): Pt will perform self ROM of her LUE with mod cues. OT Short Term Goal 4 - Progress (Week 4): Met Week 5:  OT Short Term Goal 1 (Week 5): Pt will be able to stand with mod A without UE support so she can A with adjusting clothing over hips. OT Short Term Goal 2 (Week 5): Pt will complete squat pivots to toilet consistently with mod A or less.   Skilled Therapeutic Interventions/Progress Updates:    Pt seen for ADL retraining with a focus on sit to stand skills using stedy. Explained to pt that her new STG is to work on standing balance without using RUE for support so she can use her arm for clothing management and to also get more consistent with squat pivot transfers. Due to limited time today as pt requested a shower, used Stedy for all sit to stands and transfers. Pt continues to hold her balance in the stedy well, good sitting balance on tub bench. Pt is picking up on adaptive strategies well for bathing with long handled sponge. Dressing completed and pt resting in w/c with hand off to speech therapist.  Therapy Documentation Precautions:  Precautions Precautions: Fall Precaution Comments: helmet when OOB Restrictions Weight Bearing Restrictions: No      Pain: Pain Assessment Pain Score: 2  Pain Type: Acute pain Pain Location: Leg Pain Orientation: Left Pain Descriptors / Indicators: Aching;Discomfort Pain Intervention(s): Medication (See eMAR) ADL: ADL ADL Comments: refer to navigator   See Function Navigator for Current Functional Status.   Therapy/Group: Individual Therapy  Jacobo Moncrief 06/26/2016, 12:11 PM

## 2016-06-26 NOTE — Progress Notes (Signed)
Urine culture done this weekend due to reports of dysuria. She reports persistent symptoms and UCS with staph epidermidis. Will start Macrobid x 5 days for treatment.

## 2016-06-26 NOTE — Progress Notes (Signed)
Speech Language Pathology Weekly Progress and Session Note  Patient Details  Name: Cheryl Gibbs MRN: [id number] Date of Birth: 1990-06-21  Beginning of progress report period: June 19, 2016 End of progress report period: June 26, 2016  Today's Date: 06/26/2016 SLP Individual Time: 0830-0900 SLP Individual Time Calculation (min): 30 min  Short Term Goals: Week 5: SLP Short Term Goal 1 (Week 5): Patient will demonstrate basic problem solving with Min A verbal cues.  SLP Short Term Goal 1 - Progress (Week 5): Met SLP Short Term Goal 2 (Week 5): Patient will recall new, daily information with Min A multimodal cues.  SLP Short Term Goal 2 - Progress (Week 5): Met SLP Short Term Goal 3 (Week 5): Patient will verbally express her wants/needs at the phrase level with Min A verbal cues to self-monitor and correct errors.  SLP Short Term Goal 3 - Progress (Week 5): Met SLP Short Term Goal 4 (Week 5): Patient will demonstrate 90% intelligibility at the phrase level with Min A multimodal cues.  SLP Short Term Goal 4 - Progress (Week 5): Met SLP Short Term Goal 5 (Week 5): Patient will consume current diet with minimal overt s/s of aspiration and Mod I for use of swallowing compensatory strategies  SLP Short Term Goal 5 - Progress (Week 5): Met SLP Short Term Goal 6 (Week 5): Patient will consume trials of thin liquids with minimal overt s/s of aspiration and demonstrate a timely swallow initiation (less than 5 seconds) in 75% of opportunities with Min A verbal cues.  SLP Short Term Goal 6 - Progress (Week 5): Met    New Short Term Goals: Week 6: SLP Short Term Goal 1 (Week 6): Patient will verbally express her wants/needs at the phrase level with supervision verbal cues to self-monitor and correct errors.  SLP Short Term Goal 2 (Week 6): Patient will demonstrate 90% intelligibility at the phrase level with supervision multimodal cues.  SLP Short Term Goal 3 (Week 6): Patient will consume trials  of thin liquids with minimal overt s/s of aspiration and demonstrate a timely swallow initiation (less than 5 seconds) in 90% of opportunities with Min A verbal cues.  SLP Short Term Goal 4 (Week 6): Patient will demonstrate functional problem solving with functional tasks with supervision verbal cues.  SLP Short Term Goal 5 (Week 6): Patient will recall new, daily information with supervision verbal cues.   Weekly Progress Updates: Patient continues to make functional gains and has met 6 of 6 STG's this reporting period. Currently, patient is consuming Dys. 3 textures with nectar-thick liquids without overt s/s of aspiration and is Mod I for use of swallowing compensatory strategies. Therefore, patient changed to intermittent supervision with meals after tray set-up. Patient is also consuming trials of thin liquids with plans for repeat MBS next week. Patient is verbally expressing her wants/needs with extra time and Min A verbal and phonemic cues for self-monitoring and correcting errors. Patient also requires overall Min A verbal cues for problem solving and recall of new, daily information with functional and familiar tasks. Patient and family education ongoing. Patient would benefit from continued skilled SLP intervention to maximize her cognitive-linguistic and swallowing function and overall functional independence prior to discharge.      Intensity: Minumum of 1-2 x/day, 30 to 90 minutes Frequency: 3 to 5 out of 7 days Duration/Length of Stay: 2/15 Treatment/Interventions: Functional tasks;Patient/family education;Cueing hierarchy;Dysphagia/aspiration precaution training;Therapeutic Activities;Cognitive remediation/compensation;Internal/external aids;Speech/Language facilitation   Daily Session  Skilled Therapeutic Interventions: Skilled treatment  session focused on dysphagia and speech goals. Patient consumed breakfast meal of Dys. 3 textures with nectar-thick liquids with Mod I for use of  swallowing compensatory strategies without overt s/s of aspiration. Patient also verbally expressed wants/needs at the phrase level with supervision verbal cues. Patient left upright in bed with all needs within reach. Continue with current plan of care.       Function:   Eating Eating   Modified Consistency Diet: Yes Eating Assist Level: Set up assist for;Supervision or verbal cues   Eating Set Up Assist For: Opening containers       Cognition Comprehension Comprehension assist level: Understands basic 75 - 89% of the time/ requires cueing 10 - 24% of the time  Expression   Expression assist level: Expresses basic 75 - 89% of the time/requires cueing 10 - 24% of the time. Needs helper to occlude trach/needs to repeat words.  Social Interaction Social Interaction assist level: Interacts appropriately 75 - 89% of the time - Needs redirection for appropriate language or to initiate interaction.  Problem Solving Problem solving assist level: Solves basic 75 - 89% of the time/requires cueing 10 - 24% of the time  Memory Memory assist level: Recognizes or recalls 75 - 89% of the time/requires cueing 10 - 24% of the time   Pain No/Denies Pain   Therapy/Group: Individual Therapy  Edmund Rick 06/26/2016, 3:30 PM

## 2016-06-26 NOTE — Progress Notes (Signed)
Speech Language Pathology Daily Session Note  Patient Details  Name: Cheryl ClinesLaura Gibbs MRN: [id number] Date of Birth: 06/21/1990  Today's Date: 06/26/2016 SLP Group Time: 1130-1215 SLP Group Time Calculation (min): 45 min  Short Term Goals: Week 6: SLP Short Term Goal 1 (Week 6): Patient will verbally express her wants/needs at the phrase level with supervision verbal cues to self-monitor and correct errors.  SLP Short Term Goal 2 (Week 6): Patient will demonstrate 90% intelligibility at the phrase level with supervision multimodal cues.  SLP Short Term Goal 3 (Week 6): Patient will consume trials of thin liquids with minimal overt s/s of aspiration and demonstrate a timely swallow initiation (less than 5 seconds) in 90% of opportunities with Min A verbal cues.  SLP Short Term Goal 4 (Week 6): Patient will demonstrate functional problem solving with functional tasks with supervision verbal cues.  SLP Short Term Goal 5 (Week 6): Patient will recall new, daily information with supervision verbal cues.   Skilled Therapeutic Interventions: Skilled treatment session focused on dysphagia and speech goals. SLP facilitated session by providing skilled observation with lunch meal of Dys. 3 textures with nectar-thick liquids. Patient consumed meal without overt s/s of aspiration and was Mod I for use of swallowing compensatory strategies. Patient demonstrated minimal social interaction with unfamiliar group member but did verbally communicate at the word and phrase level with Min A verbal cues to self-monitor and correct errors. Patient left upright in wheelchair with all needs within reach. Continue with current plan of care.      Function:  Eating Eating   Modified Consistency Diet: Yes Eating Assist Level: Set up assist for;Supervision or verbal cues   Eating Set Up Assist For: Opening containers       Cognition Comprehension Comprehension assist level: Understands basic 75 - 89% of the time/ requires  cueing 10 - 24% of the time  Expression   Expression assist level: Expresses basic 75 - 89% of the time/requires cueing 10 - 24% of the time. Needs helper to occlude trach/needs to repeat words.  Social Interaction Social Interaction assist level: Interacts appropriately 75 - 89% of the time - Needs redirection for appropriate language or to initiate interaction.  Problem Solving Problem solving assist level: Solves basic 75 - 89% of the time/requires cueing 10 - 24% of the time  Memory Memory assist level: Recognizes or recalls 75 - 89% of the time/requires cueing 10 - 24% of the time    Pain No reports of pain   Therapy/Group: Individual Therapy  Marta Bouie 06/26/2016, 3:46 PM

## 2016-06-26 NOTE — Progress Notes (Signed)
Physical Therapy Session Note  Patient Details  Name: Cheryl Gibbs MRN: [id number] Date of Birth: 02-23-1991  Today's Date: 06/26/2016 PT Individual Time: 1520-1600 PT Individual Time Calculation (min): 40 min   Short Term Goals: Week 5:  PT Short Term Goal 1 (Week 5): Pt will sequence squat pivot transfer with min cuing from therapist.  PT Short Term Goal 2 (Week 5): Pt will propel w/c with min cuing to attend to L.  Skilled Therapeutic Interventions/Progress Updates:    Bed mobility with mod assist to come to sitting EOB with Heavy use of bed rails. PT donned Air cast and Bilateral shoes for time management while patient sat EOB with Super vision assist.   Sqaut pivot transfer with mod assist from PT to [facility] with min cues for proper set up and LE positioning.   WC mobility 236f and 3035fwith Hemi technique. Min cues for awareness of obstacles on L. Patient ran into 2 sepreate doorways with WC mobility training. Patient also demonstrated poor awareness in hospital environment and is unable to navigate to familiar locations with mod-max cues for direction.    Car transfer with max assist to SUV height. PT required to provided max cues for set up and safety of transfers and Block LLE to prevent knee buckling and allow reciprocal scooting onto seate. PT recommended that patient utilize smaller car if possible to improve safety of transfer.   Patient returned too room and performed Mod assist squat pivot to bed and sit>supine transfer with mod assist to control BLE. Pt left Supine in bed with call bell in reach and all needs met.      Therapy Documentation Precautions:  Precautions Precautions: Fall Precaution Comments: helmet when OOB Restrictions Weight Bearing Restrictions: No General:   Vital Signs: Therapy Vitals Temp: 98.2 F (36.8 C) Temp Source: Oral Pulse Rate: 68 Resp: 18 BP: 111/73 Patient Position (if appropriate): Sitting Oxygen Therapy SpO2: 98 % O2 Device: Not  Delivered Pain: Pain Assessment Pain Score: 2  Pain Type: Acute pain Pain Location: Leg Pain Orientation: Left Pain Descriptors / Indicators: Aching Pain Intervention(s): Medication (See eMAR)   See Function Navigator for Current Functional Status.   Therapy/Group: Individual Therapy  AuLorie Phenix/06/2016, 5:42 PM

## 2016-06-26 NOTE — Progress Notes (Signed)
PHYSICAL MEDICINE & REHABILITATION     PROGRESS NOTE    Subjective/Complaints: Lying in bed. No new problems. Apparently keppra still in liquid form, and she would like it changed.   ROS: pt denies nausea, vomiting, diarrhea, cough, shortness of breath or chest pain    Objective: Vital Signs: Blood pressure 103/67, pulse 60, temperature 97.7 F (36.5 C), temperature source Oral, resp. rate 18, height 5\' 4"  (1.626 m), weight 81.1 kg (178 lb 14.4 oz), SpO2 97 %. No results found. No results for input(s): WBC, HGB, HCT, PLT in the last 72 hours. No results for input(s): NA, K, CL, GLUCOSE, BUN, CREATININE, CALCIUM in the last 72 hours.  Invalid input(s): CO CBG (last 3)  No results for input(s): GLUCAP in the last 72 hours.  Wt Readings from Last 3 Encounters:  06/17/16 81.1 kg (178 lb 14.4 oz)  05/20/16 84.4 kg (186 lb 1.6 oz)    Physical Exam:  Constitutional: She appears well-developed and well-nourished.  HENT: crani site clean,   Eyes: EOMI. No discharge.   Neck: Trach stoma healed    Cardiovascular:RRR Respiratory: CTA bilaterally  GI: Soft. Bowel sounds are normal. PEG site remains clean/dry.  Musculoskeletal: No edema. Improved tolerance of PROM in both LUE and LLE Neurological: language non-fluent  with short phrases.  Spastic left hemiparesis 1-2/4 flexor tone LUE and 1/4 LLE. Stable. Left hemisensory deficits--no changes Moves RUE/RLE freely 5/5  Skin: Skin is warm and dry.  Psychiatric: pleasant and more engaging now  Assessment/Plan: 1. 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 Physiatrist is providing close team supervision and 24 hour management of active medical problems listed below. Physiatrist and rehab team continue to assess barriers to discharge/monitor patient progress toward functional and medical  goals.  Function:  Bathing Bathing position   Position: Shower  Bathing parts Body parts bathed by patient: Left arm, Chest, Abdomen, Front perineal area, Left upper leg, Right upper leg, Right arm, Left lower leg Body parts bathed by helper: Buttocks, Back, Right lower leg  Bathing assist Assist Level: Touching or steadying assistance(Pt > 75%)      Upper Body Dressing/Undressing Upper body dressing   What is the patient wearing?: Pull over shirt/dress     Pull over shirt/dress - Perfomed by patient: Thread/unthread right sleeve, Put head through opening Pull over shirt/dress - Perfomed by helper: Thread/unthread left sleeve, Pull shirt over trunk Button up shirt - Perfomed by patient: Thread/unthread right sleeve Button up shirt - Perfomed by helper: Thread/unthread left sleeve, Pull shirt around back, Button/unbutton shirt    Upper body assist Assist Level:  (Max A)      Lower Body Dressing/Undressing Lower body dressing   What is the patient wearing?: Pants, Socks, Shoes     Pants- Performed by patient: Thread/unthread right pants leg Pants- Performed by helper: Thread/unthread left pants leg, Pull pants up/down   Non-skid slipper socks- Performed by helper: Don/doff left sock Socks - Performed by patient: Don/doff right sock Socks - Performed by helper: Don/doff left sock Shoes - Performed by patient: Don/doff right shoe Shoes - Performed by helper: Don/doff left shoe, Fasten right, Fasten left   AFO - Performed by helper: Don/doff left AFO      Lower body assist Assist for lower body dressing:  (Max A)      Toileting Toileting Toileting activity did not occur: Refused  Toileting steps completed by helper: Adjust clothing prior to toileting, Performs perineal hygiene, Adjust clothing after toileting Toileting Assistive Devices: Toilet aid  Toileting assist Assist level: Two helpers   Transfers Chair/bed transfer   Chair/bed transfer method: Squat  pivot Chair/bed transfer assist level: Maximal assist (Pt 25 - 49%/lift and lower) Chair/bed transfer assistive device: Bedrails, Armrests Mechanical lift: Stedy   Locomotion Ambulation Ambulation activity did not occur: Safety/medical concerns   Max distance: 60 ft Assist level: Moderate assist (Pt 50 - 74%)   Wheelchair   Type: Manual Max wheelchair distance: 170ft Assist Level: Supervision or verbal cues  Cognition Comprehension Comprehension assist level: Understands basic 75 - 89% of the time/ requires cueing 10 - 24% of the time  Expression Expression assist level: Expresses basic 75 - 89% of the time/requires cueing 10 - 24% of the time. Needs helper to occlude trach/needs to repeat words.  Social Interaction Social Interaction assist level: Interacts appropriately 75 - 89% of the time - Needs redirection for appropriate language or to initiate interaction.  Problem Solving Problem solving assist level: Solves basic 75 - 89% of the time/requires cueing 10 - 24% of the time  Memory Memory assist level: Recognizes or recalls 75 - 89% of the time/requires cueing 10 - 24% of the time     Medical Problem List and Plan: 1.  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  -cont CIR-PT, OT, SLP--   -continue  helmet when OOB  -Dr. Lionel December, [facility]---cranioplasty now scheduled for 2/15.   -family ed continues 2.  DVT Prophylaxis/Anticoagulation: Mechanical: Sequential compression devices, below knee Bilateral lower extremities. LLE dopplers 12/26 negative for DVT 3. Headaches/Pain Management: controlled at present  -extremity pain is neuropathic  -maintain gabapentin at 600mg  TID          -continue hydrocodone at 10mg  prn  -consider TCA at bedtime  -overall appears improved 4. Mood: LCSW to follow for evaluation and support when appropriate.   -depression has been an issue  -zoloft at 100mg  daily    -adderall 20mg   XR for initiation/engagement (was also using at home for ADHD)   5. Neuropsych: This patient is not capable of making decisions on her own behalf. 6. Skin/Wound Care: routine pressure relief measures.  7. Fluids/Electrolytes/Nutrition:  D2/nectars per SLP-   -eating more  -consider PEG removal next week if she remains consistent  - all meds   pill form with applesauce 8. VDRF:  trach stoma closed 9. New onset seizures: Continue Keppra bid. Changed to 500mg  tablet 10. Spasticity/Neuropathy LUE/LLE: see above  -baclofen  20 TID    -LLE ROM, splinting     11. Hypotension/bradycardia  Improved  .  LOS (Days) 37 A FACE TO FACE EVALUATION WAS PERFORMED  Ranelle Oyster, MD 06/26/2016 10:46 AM

## 2016-06-27 ENCOUNTER — Inpatient Hospital Stay (HOSPITAL_COMMUNITY): Payer: Managed Care, Other (non HMO) | Admitting: Physical Therapy

## 2016-06-27 NOTE — Progress Notes (Signed)
[facility] PHYSICAL MEDICINE & REHABILITATION     PROGRESS NOTE    Subjective/Complaints: Some Left foot pain lst noc, not wearing splint  ROS: pt denies nausea, vomiting, diarrhea, cough, shortness of breath or chest pain    Objective: Vital Signs: Blood pressure 98/60, pulse 62, temperature 97.4 F (36.3 C), temperature source Oral, resp. rate 18, height '5\' 4"'  (1.626 m), weight 81.1 kg (178 lb 14.4 oz), SpO2 96 %. No results found. No results for input(s): WBC, HGB, HCT, PLT in the last 72 hours. No results for input(s): NA, K, CL, GLUCOSE, BUN, CREATININE, CALCIUM in the last 72 hours.  Invalid input(s): CO CBG (last 3)  No results for input(s): GLUCAP in the last 72 hours.  Wt Readings from Last 3 Encounters:  06/17/16 81.1 kg (178 lb 14.4 oz)  05/20/16 84.4 kg (186 lb 1.6 oz)    Physical Exam:  Constitutional: She appears well-developed and well-nourished.  HENT: crani site clean,   Eyes: EOMI. No discharge.   Neck: Trach stoma healed    Cardiovascular:RRR Respiratory: CTA bilaterally  GI: Soft. Bowel sounds are normal. PEG site remains clean/dry.  Musculoskeletal: No edema. Improved tolerance of PROM in both LUE and LLE Neurological: language non-fluent  with short phrases.  Spastic left hemiparesis 1-2/4 flexor tone LUE and 1/4 LLE. Stable. Left hemisensory deficits--no changes Moves RUE/RLE freely 5/5  Left achilles tender to palpation with pain during stretching, no pain with toe or foot ROM, no erythema or swelling Skin: Skin is warm and dry.  Psychiatric: pleasant and more engaging now  Assessment/Plan: 1. 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 Physiatrist is providing close team supervision and 24 hour management of active medical problems listed below. Physiatrist and rehab team continue to assess barriers to  discharge/monitor patient progress toward functional and medical goals.  Function:  Bathing Bathing position   Position: Shower  Bathing parts Body parts bathed by patient: Left arm, Chest, Abdomen, Front perineal area, Left upper leg, Right upper leg, Right arm, Left lower leg Body parts bathed by helper: Buttocks, Back, Right lower leg  Bathing assist Assist Level: Touching or steadying assistance(Pt > 75%)      Upper Body Dressing/Undressing Upper body dressing   What is the patient wearing?: Pull over shirt/dress     Pull over shirt/dress - Perfomed by patient: Thread/unthread right sleeve, Put head through opening Pull over shirt/dress - Perfomed by helper: Thread/unthread left sleeve, Pull shirt over trunk Button up shirt - Perfomed by patient: Thread/unthread right sleeve Button up shirt - Perfomed by helper: Thread/unthread left sleeve, Pull shirt around back, Button/unbutton shirt    Upper body assist Assist Level:  (Max A)      Lower Body Dressing/Undressing Lower body dressing   What is the patient wearing?: Pants, Socks, Shoes     Pants- Performed by patient: Thread/unthread right pants leg Pants- Performed by helper: Thread/unthread left pants leg, Pull pants up/down   Non-skid slipper socks- Performed by helper: Don/doff left sock Socks - Performed by patient: Don/doff right sock Socks - Performed by helper: Don/doff left sock Shoes - Performed by patient: Don/doff right shoe Shoes - Performed by helper: Don/doff left shoe, Fasten right, Fasten left   AFO - Performed by helper: Don/doff left AFO      Lower body assist Assist for lower body dressing:  (Max A)  Toileting Toileting Toileting activity did not occur: Refused   Toileting steps completed by helper: Adjust clothing prior to toileting, Performs perineal hygiene, Adjust clothing after toileting Toileting Assistive Devices: Toilet aid  Toileting assist Assist level: Two helpers    Transfers Chair/bed transfer   Chair/bed transfer method: Squat pivot Chair/bed transfer assist level: Maximal assist (Pt 25 - 49%/lift and lower) Chair/bed transfer assistive device: Bedrails, Armrests Mechanical lift: Stedy   Locomotion Ambulation Ambulation activity did not occur: Safety/medical concerns   Max distance: 60 ft Assist level: Moderate assist (Pt 50 - 74%)   Wheelchair   Type: Manual Max wheelchair distance: 166f Assist Level: Supervision or verbal cues  Cognition Comprehension Comprehension assist level: Understands basic 75 - 89% of the time/ requires cueing 10 - 24% of the time  Expression Expression assist level: Expresses basic 75 - 89% of the time/requires cueing 10 - 24% of the time. Needs helper to occlude trach/needs to repeat words.  Social Interaction Social Interaction assist level: Interacts appropriately 50 - 74% of the time - May be physically or verbally inappropriate.  Problem Solving Problem solving assist level: Solves basic 75 - 89% of the time/requires cueing 10 - 24% of the time  Memory Memory assist level: Recognizes or recalls 75 - 89% of the time/requires cueing 10 - 24% of the time     Medical Problem List and Plan: 1.  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  -cont CIR-PT, OT, SLP--   -continue  helmet when OOB  -Dr. FBridgette Habermann [facility]---cranioplasty now scheduled for 2/15.   -family ed continues 2.  DVT Prophylaxis/Anticoagulation: Mechanical: Sequential compression devices, below knee Bilateral lower extremities. LLE dopplers 12/26 negative for DVT 3. Headaches/Pain Management: controlled at present  -extremity pain is neuropathic. Also developing contracture and is non compliant with splinting  -maintain gabapentin at 603mTID          -continue hydrocodone at 1034mrn  -consider TCA at bedtime  -overall appears improved 4. Mood: LCSW to follow for evaluation  and support when appropriate.   -depression has been an issue  -zoloft at 100m8mily    -adderall 20mg26mfor initiation/engagement (was also using at home for ADHD)   5. Neuropsych: This patient is not capable of making decisions on her own behalf. 6. Skin/Wound Care: routine pressure relief measures.  7. Fluids/Electrolytes/Nutrition:  D2/nectars per SLP-   -eating more  -consider PEG removal next week if she remains consistent  - all meds   pill form with applesauce 8. VDRF:  trach stoma closed 9. New onset seizures: Continue Keppra bid. Changed to 500mg 23met 10. Spasticity/Neuropathy LUE/LLE: see above  -baclofen  20 TID  , increase to QID  -LLE ROM, splinting     11. Hypotension/bradycardia   Vitals:   06/26/16 1349 06/27/16 0521  BP: 111/73 98/60  Pulse: 68 62  Resp: 18 18  Temp: 98.2 F (36.8 C) 97.4 F (36.3 C)    .  L[place] (Days) 38 [place] WAS PER[place]  KIRSTECharlett Blake/07/2016 7:26 AM

## 2016-06-27 NOTE — Progress Notes (Signed)
Physical Therapy Session Note  Patient Details  Name: Cheryl Gibbs MRN: [id number] Date of Birth: [birth year]  Today's Date: 06/27/2016 PT Individual Time: 1415-1515 PT Individual Time Calculation (min): 60 min   Short Term Goals: Week 5:  PT Short Term Goal 1 (Week 5): Pt will sequence squat pivot transfer with min cuing from therapist.  PT Short Term Goal 2 (Week 5): Pt will propel w/c with min cuing to attend to L. Week 6:     Skilled Therapeutic Interventions/Progress Updates:    Pt received sitting in WC and agreeable to PT  WC mobility in various settings including gift shop x142f, over tile floor x 2083fand 15034fnd carpeted surface x [id number] assist from PT with moderate cues for awareness of L side in crowed environment and through doors.    Squat pivot transfers to and from nustep with mod assist from PT and mod cues for proper set up as well as mod Assist for squat pivot transfer back to bed at end of session.   Nustep reciprocal movement pattern training for RUE/RUE and LLE x 10 minutes level 3 with LLE abduction support. Moderate cues to improve use of LLE and achieve full knee extension.  Pattern declined gait training due to pain in the LLE following nustep. .   PT instructed patient in problem solving task  For Navigation through hospital to find various locations including gift shop and day room on rehab unit. PT  provided mod-max cues for use of signs and awareness of location of own room.  Patient returned to room and left  in bed, following mad assist transfer to supine, with call bell in reach and all needs met.       Therapy Documentation Precautions:  Precautions Precautions: Fall Precaution Comments: helmet when OOB Restrictions Weight Bearing Restrictions: No   See Function Navigator for Current Functional Status.   Therapy/Group: Individual Therapy  AustLorie Phenix/2018, 3:21 PM

## 2016-06-27 NOTE — Progress Notes (Signed)
06/27/16/1441 nursing Patient refuses water flushes on her peg tube offered 2x.

## 2016-06-28 ENCOUNTER — Inpatient Hospital Stay (HOSPITAL_COMMUNITY): Payer: Managed Care, Other (non HMO) | Admitting: Occupational Therapy

## 2016-06-28 NOTE — Progress Notes (Signed)
[facility] PHYSICAL MEDICINE & REHABILITATION     PROGRESS NOTE    Subjective/Complaints: Pt asking about tuber removal  ROS: pt denies nausea, vomiting, diarrhea, cough, shortness of breath or chest pain    Objective: Vital Signs: Blood pressure 104/79, pulse 77, temperature 97.7 F (36.5 C), temperature source Oral, resp. rate 16, height 5\' 4"  (1.626 m), weight 81.1 kg (178 lb 14.4 oz), SpO2 95 %. No results found. No results for input(s): WBC, HGB, HCT, PLT in the last 72 hours. No results for input(s): NA, K, CL, GLUCOSE, BUN, CREATININE, CALCIUM in the last 72 hours.  Invalid input(s): CO CBG (last 3)  No results for input(s): GLUCAP in the last 72 hours.  Wt Readings from Last 3 Encounters:  06/17/16 81.1 kg (178 lb 14.4 oz)  05/20/16 84.4 kg (186 lb 1.6 oz)    Physical Exam:  Constitutional: She appears well-developed and well-nourished.  HENT: crani site clean,   Eyes: EOMI. No discharge.   Neck: Trach stoma healed    Cardiovascular:RRR Respiratory: CTA bilaterally  GI: Soft. Bowel sounds are normal. PEG site remains clean/dry.  Musculoskeletal: No edema. Improved tolerance of PROM in both LUE and LLE Neurological: language non-fluent  with short phrases.  Spastic left hemiparesis 1-2/4 flexor tone LUE and 1/4 LLE. Stable. Left hemisensory deficits--no changes Moves RUE/RLE freely 5/5  Left achilles tender to palpation with pain during stretching, no pain with toe or foot ROM, no erythema or swelling Skin: Skin is warm and dry.  Psychiatric: pleasant and more engaging now  Assessment/Plan: 1. 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 Physiatrist is providing close team supervision and 24 hour management of active medical problems listed below. Physiatrist and rehab team continue to assess barriers to discharge/monitor patient  progress toward functional and medical goals.  Function:  Bathing Bathing position   Position: Shower  Bathing parts Body parts bathed by patient: Left arm, Chest, Abdomen, Front perineal area, Left upper leg, Right upper leg, Right arm, Left lower leg Body parts bathed by helper: Buttocks, Back, Right lower leg  Bathing assist Assist Level: Touching or steadying assistance(Pt > 75%)      Upper Body Dressing/Undressing Upper body dressing   What is the patient wearing?: Pull over shirt/dress     Pull over shirt/dress - Perfomed by patient: Thread/unthread right sleeve, Put head through opening Pull over shirt/dress - Perfomed by helper: Thread/unthread left sleeve, Pull shirt over trunk Button up shirt - Perfomed by patient: Thread/unthread right sleeve Button up shirt - Perfomed by helper: Thread/unthread left sleeve, Pull shirt around back, Button/unbutton shirt    Upper body assist Assist Level:  (Max A)      Lower Body Dressing/Undressing Lower body dressing   What is the patient wearing?: Pants, Socks, Shoes     Pants- Performed by patient: Thread/unthread right pants leg Pants- Performed by helper: Thread/unthread left pants leg, Pull pants up/down   Non-skid slipper socks- Performed by helper: Don/doff left sock Socks - Performed by patient: Don/doff right sock Socks - Performed by helper: Don/doff left sock Shoes - Performed by patient: Don/doff right shoe Shoes - Performed by helper: Don/doff left shoe, Fasten right, Fasten left   AFO - Performed by helper: Don/doff left AFO      Lower body assist Assist for lower body dressing:  (Max A)      Toileting Toileting Toileting  activity did not occur: Refused   Toileting steps completed by helper: Adjust clothing prior to toileting, Performs perineal hygiene, Adjust clothing after toileting Toileting Assistive Devices: Toilet aid  Toileting assist Assist level: Two helpers   Transfers Chair/bed transfer    Chair/bed transfer method: Squat pivot Chair/bed transfer assist level: Moderate assist (Pt 50 - 74%/lift or lower) Chair/bed transfer assistive device: Armrests Mechanical lift: Stedy   Locomotion Ambulation Ambulation activity did not occur: Safety/medical concerns   Max distance: 60 ft Assist level: Moderate assist (Pt 50 - 74%)   Wheelchair   Type: Manual Max wheelchair distance: 21900ft  Assist Level: Supervision or verbal cues  Cognition Comprehension Comprehension assist level: Understands basic 75 - 89% of the time/ requires cueing 10 - 24% of the time  Expression Expression assist level: Expresses basic 75 - 89% of the time/requires cueing 10 - 24% of the time. Needs helper to occlude trach/needs to repeat words.  Social Interaction Social Interaction assist level: Interacts appropriately 50 - 74% of the time - May be physically or verbally inappropriate.  Problem Solving Problem solving assist level: Solves basic 75 - 89% of the time/requires cueing 10 - 24% of the time  Memory Memory assist level: Recognizes or recalls 75 - 89% of the time/requires cueing 10 - 24% of the time     Medical Problem List and Plan: 1.  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  -cont CIR-PT, OT, SLP--   -continue  helmet when OOB  -Dr. Lionel DecemberFargen, NS/[facility]---cranioplasty now scheduled for 2/15.   -family ed continues 2.  DVT Prophylaxis/Anticoagulation: Mechanical: Sequential compression devices, below knee Bilateral lower extremities. LLE dopplers 12/26 negative for DVT 3. Headaches/Pain Management: controlled at present  -extremity pain is neuropathic.  Also has excess tone  -maintain gabapentin at 600mg  TID          -continue hydrocodone at 10mg  prn  -consider TCA at bedtime  -overall appears improved 4. Mood: LCSW to follow for evaluation and support when appropriate.   -depression has been an issue  -zoloft at 100mg   daily    -adderall 20mg  XR for initiation/engagement (was also using at home for ADHD)   5. Neuropsych: This patient is not capable of making decisions on her own behalf. 6. Skin/Wound Care: routine pressure relief measures.  7. Fluids/Electrolytes/Nutrition:  D3/nectars per SLP-   -eating more  -consider PEG removal next week if she remains consistent- Pt asking about tube removal  - all meds   pill form with applesauce 8. VDRF:  trach stoma closed 9. New onset seizures: Continue Keppra bid. Changed to 500mg  tablet 10. Spasticity/Neuropathy LUE/LLE: see above  -baclofen  20 TID  , increase to QID- improved tone this am  -LLE ROM, splinting     11. Hypotension/bradycardia   Vitals:   06/27/16 1430 06/28/16 0624  BP: 118/74 104/79  Pulse: 68 77  Resp: 16 16  Temp: 97.6 F (36.4 C) 97.7 F (36.5 C)    .  LOS (Days) 39 A FACE TO FACE EVALUATION WAS PERFORMED  Erick ColaceKIRSTEINS,ANDREW E, MD 06/28/2016 8:16 AM

## 2016-06-28 NOTE — Progress Notes (Signed)
Occupational Therapy Session Note  Patient Details  Name: Cheryl ClinesLaura Gibbs MRN: [id number] Date of Birth: 12/16/1990  Today's Date: 06/28/2016 OT Individual Time: [id number] OT Individual Time Calculation (min): 60 min     Skilled Therapeutic Interventions/Progress Updates: Pt was lying in bed at time of arrival, requesting shower. Tx focus on ADL retraining, postural control, and safe positioning of L UE during transfers. Supine<sit completed with HOB elevated and close supervision. Sit<stand in [place] completed with Min A for tub bench transfer. Per RN request, PEG tube covered. Pt bathed while seated on bench with LH sponge, exhibited good sitting balance. She needed assistance for opening containers and declined attempting to do this herself when therapist provided opportunities to use L UE as gross assist. Hypertonicity in L handed noted, with pt unable to use for support during transfers. Pericare completed by therapist while standing in [place]. Shirt donned while in lift without LOB, requiring Mod A and cues for correct orientation. LB dressing completed at EOB with Max A, standing as needed in [place] lift. At end of session pt reported wanting to return to bed. Sit<supine completed with Mod A, pt repositioned for comfort and left hand splint donned at time of departure. Bed alarm activated.      Therapy Documentation Precautions:  Precautions Precautions: Fall Precaution Comments: helmet when OOB Restrictions Weight Bearing Restrictions: No   Pain: No c/o pain during session    ADL: ADL ADL Comments: refer to navigator    See Function Navigator for Current Functional Status.   Therapy/Group: Individual Therapy  Semaja Lymon A Tambria Pfannenstiel 06/28/2016, 12:37 PM

## 2016-06-28 NOTE — Progress Notes (Signed)
06/28/16 1800 nursing RN offered patient today to do water protocol she  says later after dinner. RN went back but mother was cleaning her head and  Patient refuse water protocol.

## 2016-06-29 ENCOUNTER — Inpatient Hospital Stay (HOSPITAL_COMMUNITY): Payer: Managed Care, Other (non HMO) | Admitting: Occupational Therapy

## 2016-06-29 ENCOUNTER — Inpatient Hospital Stay (HOSPITAL_COMMUNITY): Payer: Managed Care, Other (non HMO)

## 2016-06-29 ENCOUNTER — Inpatient Hospital Stay (HOSPITAL_COMMUNITY): Payer: Managed Care, Other (non HMO) | Admitting: Speech Pathology

## 2016-06-29 NOTE — Progress Notes (Signed)
Occupational Therapy Session Note  Patient Details  Name: Cheryl Gibbs MRN: [id number] Date of Birth: [birth year]  Today's Date: 06/29/2016 OT Individual Time: 0930-1030 OT Individual Time Calculation (min): 60 min    Short Term Goals: Week 5:  OT Short Term Goal 1 (Week 5): Pt will be able to stand with mod A without UE support so she can A with adjusting clothing over hips. OT Short Term Goal 2 (Week 5): Pt will complete squat pivots to toilet consistently with mod A or less.   Skilled Therapeutic Interventions/Progress Updates:    Pt seen for ADLs with focus on STGs.  Because a rehab tech was available the entire session as a +2, we worked on squat pivots to toilet and to tub bench without use of the stedy lift. Pt did well forward wt shifting to elevate hips to make a smooth transfer with mod A and the 2nd person providing stabilizing A.  Getting out of shower challenging due to wet tub bench, pt felt she was sliding forward,  Dried bottom and bench and then proceeded.  During toileting, pt stood with mod -max A with lean to L while she used R hand to cleanse self and to pull up her pants 50% of the way.  Transferred back to w/c with belt, tray and all needs met.  Therapy Documentation Precautions:  Precautions Precautions: Fall Precaution Comments: helmet when OOB Restrictions Weight Bearing Restrictions: No    Pain: Pain Assessment Pain Assessment: Faces Faces Pain Scale: Hurts whole lot Pain Type: Acute pain Pain Location: Foot Pain Orientation: Left Pain Descriptors / Indicators: Grimacing Pain Onset: Other (Comment) (with touch) ADL: ADL ADL Comments: refer to navigator  See Function Navigator for Current Functional Status.   Therapy/Group: Individual Therapy  [place] 06/29/2016, 12:42 PM

## 2016-06-29 NOTE — Progress Notes (Signed)
Speech Language Pathology Daily Session Note  Patient Details  Name: Ned ClinesLaura Mccollum MRN: [id number] Date of Birth: 03/20/1991  Today's Date: 06/29/2016 SLP Individual Time: 0815-0900 SLP Individual Time Calculation (min): 45 min  Short Term Goals: Week 6: SLP Short Term Goal 1 (Week 6): Patient will verbally express her wants/needs at the phrase level with supervision verbal cues to self-monitor and correct errors.  SLP Short Term Goal 2 (Week 6): Patient will demonstrate 90% intelligibility at the phrase level with supervision multimodal cues.  SLP Short Term Goal 3 (Week 6): Patient will consume trials of thin liquids with minimal overt s/s of aspiration and demonstrate a timely swallow initiation (less than 5 seconds) in 90% of opportunities with Min A verbal cues.  SLP Short Term Goal 4 (Week 6): Patient will demonstrate functional problem solving with functional tasks with supervision verbal cues.  SLP Short Term Goal 5 (Week 6): Patient will recall new, daily information with supervision verbal cues.   Skilled Therapeutic Interventions: Skilled treatment session focused on dysphagia and speech goals. SLP facilitated session by providing skilled observation with breakfast meal of Dys. 3 textures with nectar-thick liquids. Patient consumed meal without overt s/s of aspiration and was Mod I for use of swallowing compensatory strategies. SLP also facilitated session by providing Mod A verbal cues for patient to self-monitor and correct errors in regards to increasing length of utterance into a sentence by utilizing appropriate grammer in order to reduce occurences of telegraphic speech during a structured verbal task. Patient with noted increased frustration with task. Patient left upright in bed with all needs within reach. Continue with current plan of care.    Function:  Eating Eating   Modified Consistency Diet: Yes Eating Assist Level: Set up assist for;Swallowing techniques: self managed    Eating Set Up Assist For: Opening containers       Cognition Comprehension Comprehension assist level: Understands basic 75 - 89% of the time/ requires cueing 10 - 24% of the time  Expression   Expression assist level: Expresses basic 75 - 89% of the time/requires cueing 10 - 24% of the time. Needs helper to occlude trach/needs to repeat words.  Social Interaction Social Interaction assist level: Interacts appropriately 75 - 89% of the time - Needs redirection for appropriate language or to initiate interaction.  Problem Solving Problem solving assist level: Solves basic 75 - 89% of the time/requires cueing 10 - 24% of the time  Memory Memory assist level: Recognizes or recalls 75 - 89% of the time/requires cueing 10 - 24% of the time    Pain No/Denies Pain   Therapy/Group: Individual Therapy  Tarence Searcy 06/29/2016, 9:12 AM

## 2016-06-29 NOTE — Progress Notes (Signed)
[facility] PHYSICAL MEDICINE & REHABILITATION     PROGRESS NOTE    Subjective/Complaints: No new complaints. States that she still has a suture  Review of Systems  Gastrointestinal: Negative for vomiting.  Neurological: Negative for sensory change and speech change.  Psychiatric/Behavioral: Negative for substance abuse and suicidal ideas.      Objective: Vital Signs: Blood pressure 113/82, pulse 60, temperature 97.7 F (36.5 C), temperature source Oral, resp. rate 18, height 5\' 4"  (1.626 m), weight 81.1 kg (178 lb 14.4 oz), SpO2 96 %. No results found. No results for input(s): WBC, HGB, HCT, PLT in the last 72 hours. No results for input(s): NA, K, CL, GLUCOSE, BUN, CREATININE, CALCIUM in the last 72 hours.  Invalid input(s): CO CBG (last 3)  No results for input(s): GLUCAP in the last 72 hours.  Wt Readings from Last 3 Encounters:  06/17/16 81.1 kg (178 lb 14.4 oz)  05/20/16 84.4 kg (186 lb 1.6 oz)    Physical Exam:  Constitutional: She appears well-developed and well-nourished.  HENT: crani site clean,   Eyes: EOMI. No discharge.   Neck: Trach stoma healed    Cardiovascular:RRR Respiratory: CTA bilaterally  GI: Soft. Bowel sounds are normal. PEG site remains clean/dry.  Musculoskeletal: No edema. Improved tolerance of PROM in both LUE and LLE Neurological: language non-fluent  with short phrases.  Spastic left hemiparesis 1-2/4 flexor tone LUE and 1/4 LLE. Stable. Left hemisensory deficits--no changes Moves RUE/RLE freely 5/5  Left achilles tender to palpation with pain during stretching, no pain with toe or foot ROM, no erythema or swelling Skin: Skin is warm and dry.  Psychiatric: pleasant and more engaging now  Assessment/Plan: 1. 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 Physiatrist is providing close team supervision and 24 hour  management of active medical problems listed below. Physiatrist and rehab team continue to assess barriers to discharge/monitor patient progress toward functional and medical goals.  Function:  Bathing Bathing position   Position: Shower  Bathing parts Body parts bathed by patient: Left arm, Chest, Abdomen, Front perineal area, Left upper leg, Right upper leg, Right arm, Left lower leg, Right lower leg Body parts bathed by helper: Buttocks  Bathing assist Assist Level: Touching or steadying assistance(Pt > 75%)      Upper Body Dressing/Undressing Upper body dressing   What is the patient wearing?: Pull over shirt/dress     Pull over shirt/dress - Perfomed by patient: Thread/unthread right sleeve, Put head through opening Pull over shirt/dress - Perfomed by helper: Thread/unthread left sleeve, Pull shirt over trunk Button up shirt - Perfomed by patient: Thread/unthread right sleeve Button up shirt - Perfomed by helper: Thread/unthread left sleeve, Pull shirt around back, Button/unbutton shirt    Upper body assist Assist Level:  (Mod A)      Lower Body Dressing/Undressing Lower body dressing   What is the patient wearing?: Pants, Socks, Shoes     Pants- Performed by patient: Thread/unthread right pants leg Pants- Performed by helper: Thread/unthread left pants leg, Pull pants up/down   Non-skid slipper socks- Performed by helper: Don/doff left sock Socks - Performed by patient: Don/doff right sock Socks - Performed by helper: Don/doff left sock Shoes - Performed by patient: Don/doff right shoe Shoes - Performed by helper: Don/doff right shoe, Don/doff left shoe, Fasten right, Fasten left   AFO - Performed by helper: Don/doff left AFO  Lower body assist Assist for lower body dressing:  (Max A)      Toileting Toileting Toileting activity did not occur: Refused   Toileting steps completed by helper: Adjust clothing prior to toileting, Performs perineal hygiene, Adjust  clothing after toileting Toileting Assistive Devices: Grab bar or rail, Toilet aid  Toileting assist Assist level: Two helpers   Transfers Chair/bed transfer   Chair/bed transfer method: Squat pivot Chair/bed transfer assist level: Moderate assist (Pt 50 - 74%/lift or lower) Chair/bed transfer assistive device: Armrests Mechanical lift: Stedy   Locomotion Ambulation Ambulation activity did not occur: Safety/medical concerns   Max distance: 60 ft Assist level: Moderate assist (Pt 50 - 74%)   Wheelchair   Type: Manual Max wheelchair distance: 22200ft  Assist Level: Supervision or verbal cues  Cognition Comprehension Comprehension assist level: Understands basic 75 - 89% of the time/ requires cueing 10 - 24% of the time  Expression Expression assist level: Expresses basic 75 - 89% of the time/requires cueing 10 - 24% of the time. Needs helper to occlude trach/needs to repeat words.  Social Interaction Social Interaction assist level: Interacts appropriately 75 - 89% of the time - Needs redirection for appropriate language or to initiate interaction.  Problem Solving Problem solving assist level: Solves basic 75 - 89% of the time/requires cueing 10 - 24% of the time  Memory Memory assist level: Recognizes or recalls 75 - 89% of the time/requires cueing 10 - 24% of the time     Medical Problem List and Plan: 1.  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  -cont CIR-PT, OT, SLP--   -continue  helmet when OOB  -Dr. Lionel DecemberFargen, NS/[facility]---cranioplasty now scheduled for 2/15.   -family ed continues 2.  DVT Prophylaxis/Anticoagulation: Mechanical: Sequential compression devices, below knee Bilateral lower extremities. LLE dopplers 12/26 negative for DVT 3. Headaches/Pain Management: controlled at present  -extremity pain is neuropathic.  Also has excess tone  -maintain gabapentin at 600mg  TID          -continue hydrocodone at  10mg  prn  -consider TCA at bedtime  -overall appears improved 4. Mood: LCSW to follow for evaluation and support when appropriate.   -depression has been an issue  -zoloft at 100mg  daily    -adderall 20mg  XR for initiation/engagement (was also using at home for ADHD)   5. Neuropsych: This patient is not capable of making decisions on her own behalf. 6. Skin/Wound Care: remove remaining sutures.  7. Fluids/Electrolytes/Nutrition:  D3/nectars per SLP-   -eating more  -consider PEG removal next week if she remains consistent- Pt asking about tube removal  - all meds   pill form with applesauce 8. VDRF:  trach stoma closed 9. New onset seizures: Continue Keppra bid. Changed to 500mg  tablet 10. Spasticity/Neuropathy LUE/LLE: see above  -baclofen  20 TID  , increased to QID-  -LLE ROM, splinting     11. Hypotension/bradycardia   Vitals:   06/28/16 1350 06/29/16 0425  BP: 102/66 113/82  Pulse: 63 60  Resp: 18 18  Temp: 97.8 F (36.6 C) 97.7 F (36.5 C)    .  LOS (Days) 40 A FACE TO FACE EVALUATION WAS PERFORMED  Ranelle OysterSWARTZ,Shericka Johnstone T, MD 06/29/2016 10:26 AM

## 2016-06-29 NOTE — Progress Notes (Signed)
Occupational Therapy Session Note  Patient Details  Name: Cheryl ClinesLaura Nave MRN: [id number] Date of Birth: 04/20/1991  Today's Date: 06/29/2016 OT Individual Time: 1100-1133 Individual Treatment Time Calculation: 33 minutes    Skilled Therapeutic Interventions/Progress Updates: Pt participated in skilled OT session focusing on ADL retraining and hemi skills during oral care completion. Pt was sitting in w/c  With helmet donned at time of arrival, agreeable to session. Oral care completed with multiple attempts to utilize L UE as gross stabilizer for opening containers without success. Adaptive one handed techniques were also tried with pt becoming frustrated and requesting therapist to assist. Pt required overall Mod A for task. Afterwards she stood in [place] (Min A sit<stand), practiced lifting theraband over hips to simulate pants in order to increase independence with LB dressing. Pt initially very fearful of taking R UE off of Stedy bar, but could lift band over 3/4 of hips with steady assist and extra time. Afterwards pt was returned to w/c. Safety belt donned and she was left with all needs within reach at time of departure.      Therapy Documentation Precautions:  Precautions Precautions: Fall Precaution Comments: helmet when OOB Restrictions Weight Bearing Restrictions: No   Pain: Pain Assessment Pain Assessment: Faces Faces Pain Scale: Hurts whole lot Pain Type: Acute pain Pain Location: Foot Pain Orientation: Left Pain Descriptors / Indicators: Grimacing Pain Onset: Other (Comment) (with touch) ADL: ADL ADL Comments: refer to navigator    See Function Navigator for Current Functional Status.   Therapy/Group: Individual Therapy  Selig W[place]ole A Brendan Gruwell 06/29/2016, 12:54 PM

## 2016-06-29 NOTE — Progress Notes (Signed)
Physical Therapy Session Note  Patient Details  Name: Cheryl Gibbs MRN: [id number] Date of Birth: 09/07/1990  Today's Date: 06/29/2016 PT Individual Time: 1405-1506 PT Individual Time Calculation (min): 61 min   Short Term Goals: Week 5:  PT Short Term Goal 1 (Week 5): Pt will sequence squat pivot transfer with min cuing from therapist.  PT Short Term Goal 2 (Week 5): Pt will propel w/c with min cuing to attend to L.  Skilled Therapeutic Interventions/Progress Updates:    Patient seen for vestibular evaluation with full detail in grid below.  Patient with significant saccadic smooth pursuits, difficulty with VOR cancellation and with L eye pupillary dilation more than R as well as reports blurry vision and dry eye on that side.  Notes also some L  Visual field deficits.  Feel she will benefit from use of glasses with tape for occlusion on L to improve tolerance to motion in the hallway in w/c.  Also feel she needs to work on both x 1 and x 2 viewing exercises, visual tracking for less saccadic movement and VOR for visual stabilization.  She seemed to tolerate moving in hallway better with L eye occluded and R eye focused on stationary target in front.  Will likely need neuro-ophthalamological follow up as outpatient once d/c.  She hopes to return to driving, but currently needs to tolerate motion in hallway in w/c.  Patient left in w/c with quick release belt and all needs in reach.     06/29/16 0001  Vestibular Assessment  General Observation pupillary dilation bilateral  Symptom Behavior  Type of Dizziness Blurred vision ("cross eyed")  Frequency of Dizziness intermittent  Duration of Dizziness minutes  Aggravating Factors Sitting in moving car (moving through the hall in w/c)  Relieving Factors Closing eyes;Head stationary  Occulomotor Exam  Occulomotor Alignment Normal  Spontaneous Absent  Gaze-induced Left beating nystagmus with L gaze  Smooth Pursuits Saccades  Saccades Intact   Comment reports unable to see out of L eye  Vestibulo-Occular Reflex  VOR 1 Head Only (x 1 viewing) difficulty with target maintenance when pt moving head, able to maintain target with therapist moving head (reports dizziness with vertical head movements)  VOR 2 Head and Object (x 2 viewing) difficulty due to inducing dizziness  VOR Cancellation Corrective saccades (bilaterally)  Auditory  Comments intact and equal bilateral to scratch test  Positional Testing  Sidelying Test Sidelying Right;Sidelying Left  Sidelying Right  Sidelying Right Duration few seconds due to pt c/o fluid in head on this side, no nystagmus seen  Sidelying Right Symptoms No nystagmus  Sidelying Left  Sidelying Left Duration 30 sec  Sidelying Left Symptoms No nystagmus  Positional Sensitivities  Positional Sensitivities Comments sensitive to movement in w/c down hallway    Therapy Documentation Precautions:  Precautions Precautions: Fall Precaution Comments: helmet when OOB Restrictions Weight Bearing Restrictions: No Pain: Pain Assessment Pain Assessment: Faces Faces Pain Scale: Hurts little more Pain Type: Acute pain Pain Location: Leg Pain Orientation: Left Pain Descriptors / Indicators: Aching Pain Onset: With Activity   See Function Navigator for Current Functional Status.   Therapy/Group: Individual Therapy  [name]  [name], [place]PT [phone] 06/29/2016  06/29/2016, 3:40 PM

## 2016-06-30 ENCOUNTER — Inpatient Hospital Stay (HOSPITAL_COMMUNITY): Payer: Managed Care, Other (non HMO) | Admitting: Occupational Therapy

## 2016-06-30 ENCOUNTER — Inpatient Hospital Stay (HOSPITAL_COMMUNITY): Payer: Managed Care, Other (non HMO) | Admitting: Speech Pathology

## 2016-06-30 ENCOUNTER — Inpatient Hospital Stay (HOSPITAL_COMMUNITY): Payer: Managed Care, Other (non HMO) | Admitting: Physical Therapy

## 2016-06-30 MED ORDER — NAPHAZOLINE-GLYCERIN 0.012-0.2 % OP SOLN
1.0000 [drp] | Freq: Three times a day (TID) | OPHTHALMIC | Status: DC
  Administered 2016-06-30 (×2): 1 [drp] via OPHTHALMIC
  Administered 2016-06-30: 2 [drp] via OPHTHALMIC
  Administered 2016-07-01 – 2016-07-03 (×8): 1 [drp] via OPHTHALMIC
  Administered 2016-07-04: 2 [drp] via OPHTHALMIC
  Administered 2016-07-04 (×2): 1 [drp] via OPHTHALMIC
  Administered 2016-07-05 (×2): 2 [drp] via OPHTHALMIC
  Administered 2016-07-05 – 2016-07-06 (×2): 1 [drp] via OPHTHALMIC
  Administered 2016-07-06 – 2016-07-07 (×5): 2 [drp] via OPHTHALMIC
  Administered 2016-07-08: 1 [drp] via OPHTHALMIC
  Administered 2016-07-08: 2 [drp] via OPHTHALMIC
  Administered 2016-07-08: 1 [drp] via OPHTHALMIC
  Filled 2016-06-30: qty 15

## 2016-06-30 NOTE — Progress Notes (Signed)
Occupational Therapy Session Note  Patient Details  Name: Cheryl Gibbs MRN: [id number] Date of Birth: 05/18/1991  Today's Date: 06/30/2016 OT Individual Time: 4098-11910830-0915 OT Individual Time Calculation (min): 45 min    Short Term Goals: Week 5:  OT Short Term Goal 1 (Week 5): Pt will be able to stand with mod A without UE support so she can A with adjusting clothing over hips. OT Short Term Goal 2 (Week 5): Pt will complete squat pivots to toilet consistently with mod A or less.   Skilled Therapeutic Interventions/Progress Updates:    continues with c/o intense sensitivity to touch in L foot Pt seen this session for self care training of shower, toileting, dressing with a focus on sq pivot transfers and standing balance. Rehab tech present to provide +2 if needed, but fortunately pt was able to transfer well and sit to stand and maintain stand balance fairly consistently with mod A of 1.  She continues to need max cues of sequencing as far as forward wt shift, looking in opposite direction for pivot, fully elevating hip before trying to scoot to side or back.   Pt was able to stand with mod A to cleanse self post toileting and pull pants over hips 50% of the way.  Pt resting in chair with helmet on and lap belt in place. All needs within reach. Therapy Documentation Precautions:  Precautions Precautions: Fall Precaution Comments: helmet when OOB Restrictions Weight Bearing Restrictions: No   Pain: Pain Assessment Pain Assessment: 0-10 Pain Score: 5  Pain Type: Acute pain Pain Location: Head Pain Descriptors / Indicators: Headache Pain Frequency: Intermittent Pain Onset: Gradual Patients Stated Pain Goal: 1 Pain Intervention(s): Medication (See eMAR) ADL: ADL ADL Comments: refer to navigator  See Function Navigator for Current Functional Status.   Therapy/Group: Individual Therapy  SAGUIER,JULIA 06/30/2016, 9:31 AM

## 2016-06-30 NOTE — Progress Notes (Signed)
Occupational Therapy Session Note  Patient Details  Name: Cheryl Gibbs Patient MRN: [id number] Date of Birth: 07/10/1990  Today's Date: 06/30/2016 OT Individual Time: [id number] OT Individual Time Calculation (min): 44 min    Short Term Goals: Week 5:  OT Short Term Goal 1 (Week 5): Pt will be able to stand with mod A without UE support so she can A with adjusting clothing over hips. OT Short Term Goal 2 (Week 5): Pt will complete squat pivots to toilet consistently with mod A or less.   Skilled Therapeutic Interventions/Progress Updates:    Upon entering the room, pt supine in bed with mother present for session. Pt with no c/o pain this session. OT provided total A to don L LE air cast and B shoes this session. Pt performed supine >sit with mod A to EOB. Squat pivot transfer from bed >wheelchair towards L with mod A and multimodal cues for head hip relationship and anterior weight shift. OT transported pt to day room for time management. Pt performed sit >stand with mod A for lifting. Pt standing for 16 minutes during table top activity with L knee blocked for safety. Pt needing min - mod assist for balance with increased assistance needed when reaching across midline to the L. Pt returning to room and requesting to remain in wheelchair with mother present in room. All needs within reach.   Therapy Documentation Precautions:  Precautions Precautions: Fall Precaution Comments: helmet when OOB Restrictions Weight Bearing Restrictions: No General:   Vital Signs: Therapy Vitals Temp: 97.7 F (36.5 C) Temp Source: Oral Pulse Rate: 62 Resp: 18 BP: (!) 109/59 Patient Position (if appropriate): Lying Oxygen Therapy SpO2: 97 % O2 Device: Not Delivered Pain:   ADL: ADL ADL Comments: refer to navigator Exercises:   Other Treatments:    See Function Navigator for Current Functional Status.   Therapy/Group: Individual Therapy  Alen BleacherBradsher, Bertrand Vowels P 06/30/2016, 4:12 PM

## 2016-06-30 NOTE — Progress Notes (Signed)
Speech Language Pathology Daily Session Note  Patient Details  Name: Cheryl ClinesLaura Gibbs MRN: [id number] Date of Birth: 05/14/1991  Today's Date: 06/30/2016 SLP Individual Time: 4098-11910730-0815 SLP Individual Time Calculation (min): 45 min  Short Term Goals: Week 6: SLP Short Term Goal 1 (Week 6): Patient will verbally express her wants/needs at the phrase level with supervision verbal cues to self-monitor and correct errors.  SLP Short Term Goal 2 (Week 6): Patient will demonstrate 90% intelligibility at the phrase level with supervision multimodal cues.  SLP Short Term Goal 3 (Week 6): Patient will consume trials of thin liquids with minimal overt s/s of aspiration and demonstrate a timely swallow initiation (less than 5 seconds) in 90% of opportunities with Min A verbal cues.  SLP Short Term Goal 4 (Week 6): Patient will demonstrate functional problem solving with functional tasks with supervision verbal cues.  SLP Short Term Goal 5 (Week 6): Patient will recall new, daily information with supervision verbal cues.   Skilled Therapeutic Interventions: Skilled treatment session focused on dysphagia and speech goals. Patient consumed trials of thin liquids via cup after oral care without overt s/s of aspiration but demonstrated an intermittent suspected delayed swallow initiation. Recommend repeat MBS later this week to determine possible upgrade. Patient also consumed breakfast meal of Dys. 3 textures with nectar-thick liquids without overt s/s of aspiration. Patient required Min A verbal cues to utilize full sentences and to decrease use of telegraphic speech. Patient left upright in bed with all needs within reach.      Function:  Eating Eating   Modified Consistency Diet: Yes Eating Assist Level: Set up assist for;Swallowing techniques: self managed   Eating Set Up Assist For: Opening containers       Cognition Comprehension Comprehension assist level: Understands basic 75 - 89% of the time/  requires cueing 10 - 24% of the time  Expression   Expression assist level: Expresses basic 75 - 89% of the time/requires cueing 10 - 24% of the time. Needs helper to occlude trach/needs to repeat words.  Social Interaction Social Interaction assist level: Interacts appropriately 75 - 89% of the time - Needs redirection for appropriate language or to initiate interaction.  Problem Solving Problem solving assist level: Solves basic 75 - 89% of the time/requires cueing 10 - 24% of the time  Memory Memory assist level: Recognizes or recalls 75 - 89% of the time/requires cueing 10 - 24% of the time    Pain Reported a headache. RN made aware and administered medications   Therapy/Group: Individual Therapy  Mckinzie Saksa 06/30/2016, 12:47 PM

## 2016-06-30 NOTE — Progress Notes (Signed)
Physical Therapy Weekly Progress Note  Patient Details  Name: Giara Mcgaughey MRN: [id number] Date of Birth: 1990-10-29  Beginning of progress report period: June 23, 2016 End of progress report period: June 30, 2016  Patient has met 1 of 2 short term goals.  Pt continues to require assist due to Lt inattention, is improving sequencing and set up for transfers, continues to fluctuate between min/mod/max A for transfers depending on fatigue and if transfer is to Rt or Lt.  Patient continues to demonstrate the following deficits muscle weakness, abnormal tone, decreased coordination and decreased motor planning, decreased attention, decreased awareness, decreased problem solving, decreased memory and delayed processing and decreased sitting balance, decreased standing balance, decreased postural control, hemiplegia and decreased balance strategies and therefore will continue to benefit from skilled PT intervention to increase functional independence with mobility.  Patient progressing toward long term goals..  Continue plan of care.  PT Short Term Goals Week 5:  PT Short Term Goal 1 (Week 5): Pt will sequence squat pivot transfer with min cuing from therapist.  PT Short Term Goal 1 - Progress (Week 5): Met PT Short Term Goal 2 (Week 5): Pt will propel w/c with min cuing to attend to L. PT Short Term Goal 2 - Progress (Week 5): Progressing toward goal  Skilled Therapeutic Interventions/Progress Updates:  Ambulation/gait training;Cognitive remediation/compensation;Discharge planning;DME/adaptive equipment instruction;Functional mobility training;Pain management;Splinting/orthotics;Therapeutic Activities;UE/LE Strength taining/ROM;Wheelchair propulsion/positioning;UE/LE Coordination activities;Therapeutic Exercise;Stair training;Patient/family education;Neuromuscular re-education;Functional electrical stimulation;Community reintegration;Balance/vestibular training    See Function Navigator for  Current Functional Status.  Abdulahi Schor 06/30/2016, 10:28 AM

## 2016-06-30 NOTE — Progress Notes (Signed)
[place] PHYSICAL MEDICINE & REHABILITATION     PROGRESS NOTE    Subjective/Complaints: No new issues this morning  Review of Systems  Eyes: Negative for double vision.  Cardiovascular: Negative for chest pain.  Gastrointestinal: Negative for nausea.  Genitourinary: Negative for dysuria.  Musculoskeletal: Positive for myalgias.  Neurological: Positive for sensory change and speech change.  Psychiatric/Behavioral: Negative for suicidal ideas.      Objective: Vital Signs: Blood pressure 105/64, pulse 65, temperature 98.1 F (36.7 C), temperature source Oral, resp. rate 18, height 5\' 4"  (1.626 m), weight 81.1 kg (178 lb 14.4 oz), SpO2 98 %. No results found. No results for input(s): WBC, HGB, HCT, PLT in the last 72 hours. No results for input(s): NA, K, CL, GLUCOSE, BUN, CREATININE, CALCIUM in the last 72 hours.  Invalid input(s): CO CBG (last 3)  No results for input(s): GLUCAP in the last 72 hours.  Wt Readings from Last 3 Encounters:  06/17/16 81.1 kg (178 lb 14.4 oz)  05/20/16 84.4 kg (186 lb 1.6 oz)    Physical Exam:  Constitutional: She appears well-developed and well-nourished.  HENT: crani site clean,   Eyes: EOMI. No discharge.   Neck: Trach stoma healed    Cardiovascular:RRR Respiratory: CTA bilaterally  GI: Soft. Bowel sounds are normal. PEG site remains clean/dry.  Musculoskeletal: No edema. Neurological: language non-fluent  with short phrases.  Spastic left hemiparesis 1-2/4 flexor tone LUE and 1/4 LLE. Stable. Left hemisensory deficits-no changes today Moves RUE/RLE grossly 5/5  LUE and LLE still with tenderness during ROM Skin: Skin is warm and dry.  Psychiatric: pleasant and more engaging now  Assessment/Plan: 1. 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 Physiatrist is providing close team supervision and 24 hour  management of active medical problems listed below. Physiatrist and rehab team continue to assess barriers to discharge/monitor patient progress toward functional and medical goals.  Function:  Bathing Bathing position   Position: Shower  Bathing parts Body parts bathed by patient: Left arm, Chest, Abdomen, Front perineal area, Left upper leg, Right upper leg, Right arm, Left lower leg, Right lower leg Body parts bathed by helper: Buttocks, Back  Bathing assist Assist Level: Touching or steadying assistance(Pt > 75%)      Upper Body Dressing/Undressing Upper body dressing   What is the patient wearing?: Pull over shirt/dress     Pull over shirt/dress - Perfomed by patient: Thread/unthread right sleeve, Put head through opening, Pull shirt over trunk Pull over shirt/dress - Perfomed by helper: Thread/unthread left sleeve Button up shirt - Perfomed by patient: Thread/unthread right sleeve Button up shirt - Perfomed by helper: Thread/unthread left sleeve, Pull shirt around back, Button/unbutton shirt    Upper body assist Assist Level:  (Mod A)      Lower Body Dressing/Undressing Lower body dressing   What is the patient wearing?: Pants, Socks, Shoes     Pants- Performed by patient: Thread/unthread right pants leg Pants- Performed by helper: Thread/unthread left pants leg, Pull pants up/down   Non-skid slipper socks- Performed by helper: Don/doff left sock Socks - Performed by patient: Don/doff right sock Socks - Performed by helper: Don/doff left sock Shoes - Performed by patient: Don/doff right shoe Shoes - Performed by helper: Don/doff right shoe, Don/doff left shoe, Fasten right, Fasten left   AFO - Performed by helper: Don/doff left AFO      Lower body  assist Assist for lower body dressing:  (Max A)      Toileting Toileting Toileting activity did not occur: Refused Toileting steps completed by patient: Performs perineal hygiene Toileting steps completed by helper:  Adjust clothing prior to toileting, Performs perineal hygiene, Adjust clothing after toileting Toileting Assistive Devices: Grab bar or rail, Toilet aid  Toileting assist Assist level: Two helpers   Transfers Chair/bed transfer   Chair/bed transfer method: Squat pivot Chair/bed transfer assist level: Moderate assist (Pt 50 - 74%/lift or lower) Chair/bed transfer assistive device: Armrests Mechanical lift: Stedy   Locomotion Ambulation Ambulation activity did not occur: Safety/medical concerns   Max distance: 60 ft Assist level: Moderate assist (Pt 50 - 74%)   Wheelchair   Type: Manual Max wheelchair distance: 29ft  Assist Level: Supervision or verbal cues  Cognition Comprehension Comprehension assist level: Understands basic 75 - 89% of the time/ requires cueing 10 - 24% of the time  Expression Expression assist level: Expresses basic 75 - 89% of the time/requires cueing 10 - 24% of the time. Needs helper to occlude trach/needs to repeat words.  Social Interaction Social Interaction assist level: Interacts appropriately 75 - 89% of the time - Needs redirection for appropriate language or to initiate interaction.  Problem Solving Problem solving assist level: Solves basic 75 - 89% of the time/requires cueing 10 - 24% of the time  Memory Memory assist level: Recognizes or recalls 75 - 89% of the time/requires cueing 10 - 24% of the time     Medical Problem List and Plan: 1.  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  -cont CIR-PT, OT, SLP--   -continue  helmet when OOB  -Dr. Lionel December, [facility]---cranioplasty now scheduled for 2/15.   -family ed continues 2.  DVT Prophylaxis/Anticoagulation: Mechanical: Sequential compression devices, below knee Bilateral lower extremities. LLE dopplers 12/26 negative for DVT 3. Headaches/Pain Management: controlled at present  -extremity pain is neuropathic.  Also has excess tone   -maintain gabapentin at 600mg  TID          -continue hydrocodone at 10mg  prn  -consider TCA at bedtime  -overall appears improved 4. Mood: LCSW to follow for evaluation and support when appropriate.   -depression has been an issue  -zoloft at 100mg  daily    -adderall 20mg  XR for initiation/engagement (was also using at home for ADHD)   5. Neuropsych: This patient is not capable of making decisions on her own behalf. 6. Skin/Wound Care: remove remaining sutures.  7. Fluids/Electrolytes/Nutrition:  D3/nectars per SLP- has been eating better  -eating more  -consider PEG removal this week  -check labs tomorrow  - all meds   pill form with applesauce 8. VDRF:  trach stoma closed 9. New onset seizures: Continue Keppra bid. Changed to 500mg  tablet 10. Spasticity/Neuropathy LUE/LLE: see above  -baclofen  20 TID  , increased to QID-  -LLE ROM, splinting     11. Hypotension/bradycardia   Vitals:   06/29/16 1428 06/30/16 0450  BP: 118/78 105/64  Pulse: 78 65  Resp: 18 18  Temp: 97.8 F (36.6 C) 98.1 F (36.7 C)    .  LOS (Days) 41 A FACE TO FACE EVALUATION WAS PERFORMED  Ranelle Oyster, MD 06/30/2016 9:11 AM

## 2016-06-30 NOTE — Progress Notes (Signed)
Physical Therapy Note  Patient Details  Name: Cheryl ClinesLaura Empey MRN: [id number] Date of Birth: 07/11/1990 Today's Date: 06/30/2016    Time: [phone] 57 minutes  1:1 No c/o pain. Standing balance with pt using hemiwalker to get balance and then reaching with Rt UE to pull pants up/down. Pt requires mod A for standing balance during this task.  Transfer training to bed and couch in ADL apartment with min/mod A to Rt side, max A to Lt side, min cuing for sequencing and safety.  Gait with hemiwalker 3 x 20' with mod A, improved Lt LE stance control, still total A to advance Lt LE in swing phase. Pt improving mood and verbal communication each session.   Sanjit Mcmichael 06/30/2016, 10:25 AM

## 2016-07-01 ENCOUNTER — Inpatient Hospital Stay (HOSPITAL_COMMUNITY): Payer: Managed Care, Other (non HMO) | Admitting: Speech Pathology

## 2016-07-01 ENCOUNTER — Inpatient Hospital Stay (HOSPITAL_COMMUNITY): Payer: Managed Care, Other (non HMO) | Admitting: Physical Therapy

## 2016-07-01 ENCOUNTER — Inpatient Hospital Stay (HOSPITAL_COMMUNITY): Payer: Managed Care, Other (non HMO) | Admitting: Occupational Therapy

## 2016-07-01 LAB — BASIC METABOLIC PANEL
Anion gap: 12 (ref 5–15)
BUN: 8 mg/dL (ref 6–20)
CALCIUM: 10 mg/dL (ref 8.9–10.3)
CO2: 25 mmol/L (ref 22–32)
CREATININE: 0.65 mg/dL (ref 0.44–1.00)
Chloride: 106 mmol/L (ref 101–111)
GFR calc non Af Amer: >60 mL/min (ref >60)
Glucose, Bld: 87 mg/dL (ref 65–99)
Potassium: 4.1 mmol/L (ref 3.5–5.1)
SODIUM: 143 mmol/L (ref 135–145)

## 2016-07-01 LAB — CBC
HCT: 47.5 % — ABNORMAL HIGH (ref 36.0–46.0)
Hemoglobin: 15 g/dL (ref 12.0–15.0)
MCH: 28.4 pg (ref 26.0–34.0)
MCHC: 31.6 g/dL (ref 30.0–36.0)
MCV: 89.8 fL (ref 78.0–100.0)
PLATELETS: 299 10*3/uL (ref 150–400)
RBC: 5.29 MIL/uL — ABNORMAL HIGH (ref 3.87–5.11)
RDW: 14.8 % (ref 11.5–15.5)
WBC: 6.4 10*3/uL (ref 4.0–10.5)

## 2016-07-01 NOTE — Progress Notes (Signed)
Occupational Therapy Session Note  Patient Details  Name: [name] MRN: [id number] Date of Birth: 12-16-1990  Today's Date: 07/01/2016 OT Individual Time: 4709-6283 OT Individual Time Calculation (min): 75 min    Short Term Goals: Week 5:  OT Short Term Goal 1 (Week 5): Pt will be able to stand with mod A without UE support so she can A with adjusting clothing over hips. OT Short Term Goal 2 (Week 5): Pt will complete squat pivots to toilet consistently with mod A or less.   Skilled Therapeutic Interventions/Progress Updates:    Pt seen to facilitate functional mobility, activity tolerance, standing balance with ADLs.  Due to a longer treatment session today, pt was allotted more time to focus on sequencing steps needed for each movement. She continues to need cues for L foot placement before reaching, but is setting her self up better (as far as leaning forward, hip positioning) for a successful transfer. This am, she was able complete all transfers with min A squat pivot: bed to chair, chair ><  Tub bench, chair >< toilet. She is also standing with RUE support with close S and without R arm support with min A.  Max A to hold balance when she reached around to toss paper in toilet.   Pt resting in w/c with belt on, helmet on, and all needs met.  Therapy Documentation Precautions:  Precautions Precautions: Fall Precaution Comments: helmet when OOB Restrictions Weight Bearing Restrictions: No    Vital Signs: Therapy Vitals Temp: 98.3 F (36.8 C) Temp Source: Oral Pulse Rate: (!) 55 Resp: 16 BP: 105/68 Patient Position (if appropriate): Lying Oxygen Therapy SpO2: 100 % O2 Device: Not Delivered Pain: no c/o pain   ADL: ADL ADL Comments: refer to navigator  See Function Navigator for Current Functional Status.   Therapy/Group: Individual Therapy  [place] 07/01/2016, 8:46 AM

## 2016-07-01 NOTE — Progress Notes (Signed)
Speech Language Pathology Daily Session Note  Patient Details  Name: Cheryl Gibbs MRN: [id number] Date of Birth: 02/06/1991  Today's Date: 07/01/2016 SLP Individual Time: 1500-1600 SLP Individual Time Calculation (min): 60 min  Short Term Goals: Week 6: SLP Short Term Goal 1 (Week 6): Patient will verbally express her wants/needs at the phrase level with supervision verbal cues to self-monitor and correct errors.  SLP Short Term Goal 2 (Week 6): Patient will demonstrate 90% intelligibility at the phrase level with supervision multimodal cues.  SLP Short Term Goal 3 (Week 6): Patient will consume trials of thin liquids with minimal overt s/s of aspiration and demonstrate a timely swallow initiation (less than 5 seconds) in 90% of opportunities with Min A verbal cues.  SLP Short Term Goal 4 (Week 6): Patient will demonstrate functional problem solving with functional tasks with supervision verbal cues.  SLP Short Term Goal 5 (Week 6): Patient will recall new, daily information with supervision verbal cues.   Skilled Therapeutic Interventions: Skilled treatment session focused on addressing communication goals. Patient was asked to form complete and grammatically correct sentences given an initial noun/subject. SLP facilitated session by providing Min verbal cues to self-monitor and correct errors.  Patient then wrote sentence with cues increased to Mod assist verbal and visual to self-monitor and correct errors.  Patient consistently leaving out pronouns, prepositions, and articles.  Continue with current plan of care.     Function:   Cognition Comprehension Comprehension assist level: Understands basic 75 - 89% of the time/ requires cueing 10 - 24% of the time  Expression   Expression assist level: Expresses basic 75 - 89% of the time/requires cueing 10 - 24% of the time. Needs helper to occlude trach/needs to repeat words.  Social Interaction Social Interaction assist level: Interacts  appropriately 75 - 89% of the time - Needs redirection for appropriate language or to initiate interaction.  Problem Solving Problem solving assist level: Solves basic 75 - 89% of the time/requires cueing 10 - 24% of the time  Memory Memory assist level: Recognizes or recalls 75 - 89% of the time/requires cueing 10 - 24% of the time    Pain Pain Assessment Pain Assessment: No/denies pain  Therapy/Group: Individual Therapy  Cheryl FerrettiMelissa Kesi Gibbs, M.A., CCC-SLP [phone]  Cheryl Gibbs 07/01/2016, 4:16 PM

## 2016-07-01 NOTE — Progress Notes (Signed)
Physical Therapy Session Note  Patient Details  Name: Cheryl Gibbs MRN: [id number] Date of Birth: 09/20/1990  Today's Date: 07/01/2016 PT Individual Time: 1130-1200 PT Individual Time Calculation (min): 30 min   Short Term Goals: Week 5:  PT Short Term Goal 1 (Week 5): Pt will sequence squat pivot transfer with min cuing from therapist.  PT Short Term Goal 1 - Progress (Week 5): Met PT Short Term Goal 2 (Week 5): Pt will propel w/c with min cuing to attend to L. PT Short Term Goal 2 - Progress (Week 5): Progressing toward goal Week 6:     Skilled Therapeutic Interventions/Progress Updates:    Pt received sitting in WC and agreeable to PT  WC mobility in hall x 158f and 1230fas well as over carpet x 8035fith supervision assist from PT. Min cues for awareness of surroundings in familiar environment and doorway management to increase awareness of L side  Squat pivot transfer to and from WC [facility] nusatep with mod assist with mod cues from PT for proper set up and positioning to improve saferty.   Nustep reciprocal movement training x10 min at level 2 with emphasis on use of LLE. Patient noted to achieve ful extension on the LLE with min assist from RUE on handlebars to initiate movement. Pt able to initiate and complete LLE extension at hip and knee 10 times throughout exercise without UE assist.   Patient returned to room and left sitting in WC with QRB donned, call bell in reach and all needs met.        Therapy Documentation Precautions:  Precautions Precautions: Fall Precaution Comments: helmet when OOB Restrictions Weight Bearing Restrictions: No Vital Signs: Therapy Vitals Temp: 98.5 F (36.9 C) Temp Source: Oral Pulse Rate: 72 Resp: 17 BP: 109/64 Patient Position (if appropriate): Sitting Oxygen Therapy SpO2: 97 % O2 Device: Not Delivered Pain:   0/10  See Function Navigator for Current Functional Status.   Therapy/Group: Individual Therapy  AusLorie Phenix7/2018, 1:40 PM

## 2016-07-01 NOTE — Progress Notes (Signed)
Nutrition Follow-up  DOCUMENTATION CODES:   Obesity unspecified  INTERVENTION:  Encourage adequate PO intake.   Plan for PEG removal tomorrow.  RD to continue to monitor.  NUTRITION DIAGNOSIS:   Inadequate oral intake related to inability to eat as evidenced by NPO status; diet advanced; po 75-100%; improved  GOAL:   Patient will meet greater than or equal to 90% of their needs; met  MONITOR:   PO intake, Supplement acceptance, Diet advancement, Labs, Weight trends, Skin, I & O's  REASON FOR ASSESSMENT:   Consult Enteral/tube feeding initiation and management  ASSESSMENT:   26 year old female who was 10 days post partum and  was transferred to [facility] from OSH on 04/11/16  after being found unresponsive by husband.  Patient in bathtub--not submerged and activity seizing. 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  She underwent cerebral angiogram and decline neurologically shortly after admission to ICU. She was taken to OR emergently for right craniotomy for evacuation of IPH and obliteration of ruptured lenticulostriate aneurysm and placement of EVD. Trach and PEG were placed on 11/22.  She was admitted to [facility]) on 05/01/16. She is non verbal.  Meal completion has been 75-100%. Plans for PEG removal tomorrow as pt has been eating and taking her medications well. Labs and medications reviewed. RD to continue to monitor.   Diet Order:  DIET DYS 3 Room service appropriate? Yes; Fluid consistency: Nectar Thick Diet NPO time specified  Skin:  Reviewed, no issues  Last BM:  2/5  Height:   Ht Readings from Last 1 Encounters:  05/20/16 '5\' 4"'  (1.626 m)    Weight:   Wt Readings from Last 1 Encounters:  06/17/16 178 lb 14.4 oz (81.1 kg)    Ideal Body Weight:   54.5 kg  BMI:  Body mass index is 30.71 kg/m.  Estimated Nutritional Needs:   Kcal:  1850-2050  Protein:  90-105 grams  Fluid:  1.8 - 2 L/day  EDUCATION NEEDS:   No education needs identified at this time  Corrin Parker, MS, RD, LDN Pager # [phone] After hours/ weekend pager # [phone]

## 2016-07-01 NOTE — Progress Notes (Signed)
[facility] PHYSICAL MEDICINE & REHABILITATION     PROGRESS NOTE    Subjective/Complaints: Denies any new issues. Asked when her feeding tube will come out. Eating well and taking meds  Review of Systems  Cardiovascular: Negative for chest pain.  Gastrointestinal: Negative for nausea.  Genitourinary: Negative for dysuria.  Musculoskeletal: Positive for myalgias.  Neurological: Positive for sensory change and speech change.  Psychiatric/Behavioral: Negative for suicidal ideas.      Objective: Vital Signs: Blood pressure 105/68, pulse (!) 55, temperature 98.3 F (36.8 C), temperature source Oral, resp. rate 16, height 5\' 4"  (1.626 m), weight 81.1 kg (178 lb 14.4 oz), SpO2 100 %. No results found.  Recent Labs  07/01/16 0842  WBC 6.4  HGB 15.0  HCT 47.5*  PLT 299   No results for input(s): NA, K, CL, GLUCOSE, BUN, CREATININE, CALCIUM in the last 72 hours.  Invalid input(s): CO CBG (last 3)  No results for input(s): GLUCAP in the last 72 hours.  Wt Readings from Last 3 Encounters:  06/17/16 81.1 kg (178 lb 14.4 oz)  05/20/16 84.4 kg (186 lb 1.6 oz)    Physical Exam:  Constitutional: She appears well-developed and well-nourished.  HENT: crani site clean, some bulging of intracranial contents   Eyes: EOMI. No discharge.   Neck: Trach stoma healed    Cardiovascular:RRR Respiratory: CTA bilaterally  GI: Soft. Bowel sounds are normal. PEG site remains clean/dry.  Musculoskeletal: No edema. Neurological: limproved engagement, initiating conversation.  Spastic left hemiparesis tr to 1/4 flexor tone LUE and 1/4 LLE. Stable. Left hemisensory deficits-no changes today Moves RUE/RLE grossly 5/5  LUE and LLE still with tenderness during ROM--improved Skin: Skin is warm and dry.  Psychiatric: pleasant and interactive  Assessment/Plan: 1. Dense left hemiparesis and aphasia secondary to right frontal IPH/IVH which require 3+ hours per day of interdisciplinary therapy in a  comprehensive inpatient rehab setting. Physiatrist is providing close team supervision and 24 hour management of active medical problems listed below. Physiatrist and rehab team continue to assess barriers to discharge/monitor patient progress toward functional and medical goals.  Function:  Bathing Bathing position   Position: Shower  Bathing parts Body parts bathed by patient: Left arm, Chest, Abdomen, Front perineal area, Left upper leg, Right upper leg, Right arm, Left lower leg, Right lower leg Body parts bathed by helper: Buttocks, Back  Bathing assist Assist Level: Touching or steadying assistance(Pt > 75%)      Upper Body Dressing/Undressing Upper body dressing   What is the patient wearing?: Button up shirt     Pull over shirt/dress - Perfomed by patient: Thread/unthread right sleeve, Put head through opening, Pull shirt over trunk Pull over shirt/dress - Perfomed by helper: Thread/unthread left sleeve Button up shirt - Perfomed by patient: Thread/unthread right sleeve Button up shirt - Perfomed by helper: Thread/unthread left sleeve, Pull shirt around back, Button/unbutton shirt    Upper body assist Assist Level:  (Mod A)      Lower Body Dressing/Undressing Lower body dressing   What is the patient wearing?: Pants, Socks, Shoes     Pants- Performed by patient: Thread/unthread right pants leg Pants- Performed by helper: Thread/unthread left pants leg, Pull pants up/down   Non-skid slipper socks- Performed by helper: Don/doff left sock Socks - Performed by patient: Don/doff right sock Socks - Performed by helper: Don/doff right sock, Don/doff left sock Shoes - Performed by patient: Don/doff right shoe Shoes - Performed by helper: Don/doff right shoe, Don/doff left shoe, Fasten  right, Fasten left   AFO - Performed by helper: Don/doff left AFO      Lower body assist Assist for lower body dressing:  (Max A)      Toileting Toileting Toileting activity did not  occur: Refused Toileting steps completed by patient: Performs perineal hygiene Toileting steps completed by helper: Adjust clothing prior to toileting, Adjust clothing after toileting Toileting Assistive Devices: Grab bar or rail  Toileting assist Assist level: Two helpers   Transfers Chair/bed transfer   Chair/bed transfer method: Squat pivot Chair/bed transfer assist level: Moderate assist (Pt 50 - 74%/lift or lower) Chair/bed transfer assistive device: Armrests Mechanical lift: Stedy   Locomotion Ambulation Ambulation activity did not occur: Safety/medical concerns   Max distance: 60 ft Assist level: Moderate assist (Pt 50 - 74%)   Wheelchair   Type: Manual Max wheelchair distance: 22800ft  Assist Level: Supervision or verbal cues  Cognition Comprehension Comprehension assist level: Understands basic 75 - 89% of the time/ requires cueing 10 - 24% of the time  Expression Expression assist level: Expresses basic 75 - 89% of the time/requires cueing 10 - 24% of the time. Needs helper to occlude trach/needs to repeat words.  Social Interaction Social Interaction assist level: Interacts appropriately 75 - 89% of the time - Needs redirection for appropriate language or to initiate interaction.  Problem Solving Problem solving assist level: Solves basic 75 - 89% of the time/requires cueing 10 - 24% of the time  Memory Memory assist level: Recognizes or recalls 75 - 89% of the time/requires cueing 10 - 24% of the time     Medical Problem List and Plan: 1.  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  -cont CIR-PT, OT, SLP--   -continue  helmet when OOB  -Dr. Lionel DecemberFargen, [facility]---cranioplasty now scheduled for 2/15---working on facilitating logistics of procedure.   -family ed continues 2.  DVT Prophylaxis/Anticoagulation: Mechanical: Sequential compression devices, below knee Bilateral lower extremities. LLE dopplers  12/26 negative for DVT 3. Headaches/Pain Management: controlled at present  -extremity pain is neuropathic.    -maintain gabapentin at 600mg  TID          -continue hydrocodone at 10mg  prn  -consider TCA at bedtime  -overall appears improved 4. Mood: LCSW to follow for evaluation and support when appropriate.   -depression has been an issue---seems to be doing a little better  -zoloft at 100mg  daily    -adderall 20mg  XR for initiation/engagement (was also using at home for ADHD)   5. Neuropsych: This patient is not capable of making decisions on her own behalf. 6. Skin/Wound Care: remove remaining sutures.  7. Fluids/Electrolytes/Nutrition:  D3/nectars per SLP- has been eating better  -eating more  -will remove PEG tomorrow  -labs pending for today  - all meds   pill form with applesauce 8. VDRF:  trach stoma closed 9. New onset seizures: Continue Keppra bid. Changed to 500mg  tablet 10. Spasticity/Neuropathy LUE/LLE: see above  -baclofen  20 TID  , increased to QID with positive effects  -LLE ROM, splinting     11. Hypotension/bradycardia   Vitals:   06/30/16 1432 07/01/16 0620  BP: (!) 109/59 105/68  Pulse: 62 (!) 55  Resp: 18 16  Temp: 97.7 F (36.5 C) 98.3 F (36.8 C)    .  LOS (Days) 42 A FACE TO FACE EVALUATION WAS PERFORMED  Ranelle OysterSWARTZ,ZACHARY T, MD 07/01/2016 9:27 AM

## 2016-07-01 NOTE — Progress Notes (Signed)
Physical Therapy Session Note  Patient Details  Name: Cheryl Gibbs MRN: [id number] Date of Birth: 05/16/1991  Today's Date: 07/01/2016 PT Individual Time: 1002-1045 PT Individual Time Calculation (min): 43 min   Skilled Therapeutic Interventions/Progress Updates:  Pt received in w/c with RN present administering medications. Pt denied c/o pain & agreeable to tx. Session focused on w/c mobility, transfers, bed mobility & d/c planning. Pt propelled w/c (with R hemi technique) room<>rehab apartment and within apartment with supervision assistance, only requiring min assist to negotiate threshold from tile<>carpet. Pt requesting to use regular bed at home. Pt completed w/c<>bed transfer via stand pivot with mod assist for weight shifting and cuing for sequencing. Pt requires mod assist for supine<>sitting and rolling L/R in regular bed as pt unable to utilize RUE to compensate for LUE/LLE deficits to independently complete task despite heavy instructional cuing. Educated pt on recommendation of d/c with hospital bed and continue working on bed mobility to eventually transition to regular bed; pt reluctantly agreeable. At end of session pt left sitting in w/c in room with QRB donned and all needs within reach.  Pt able to verbalize complete sentences with mod encouragement during session.   Pt able to set up w/c for transfers and manage locks during session with max verbal cuing.      Therapy Documentation Precautions:  Precautions Precautions: Fall Precaution Comments: helmet when OOB Restrictions Weight Bearing Restrictions: No   See Function Navigator for Current Functional Status.   Therapy/Group: Individual Therapy  Sandi MariscalVictoria M Roshon Duell 07/01/2016, 11:23 AM

## 2016-07-02 ENCOUNTER — Inpatient Hospital Stay (HOSPITAL_COMMUNITY): Payer: Managed Care, Other (non HMO) | Admitting: Speech Pathology

## 2016-07-02 ENCOUNTER — Inpatient Hospital Stay (HOSPITAL_COMMUNITY): Payer: Managed Care, Other (non HMO) | Admitting: *Deleted

## 2016-07-02 ENCOUNTER — Inpatient Hospital Stay (HOSPITAL_COMMUNITY): Payer: Managed Care, Other (non HMO) | Admitting: Physical Therapy

## 2016-07-02 ENCOUNTER — Inpatient Hospital Stay (HOSPITAL_COMMUNITY): Payer: Managed Care, Other (non HMO)

## 2016-07-02 ENCOUNTER — Inpatient Hospital Stay (HOSPITAL_COMMUNITY): Payer: Managed Care, Other (non HMO) | Admitting: Occupational Therapy

## 2016-07-02 NOTE — Progress Notes (Signed)
Pt asked RN to remove hand splint due to discomfort. RN educated pt on importance of hand splint and prafo boot with pt refusing to wear it. Pt states, "it hurts and I don't like it." RN educated mom and pt on prafo and hand splint usage. RN continues to educate and encourage use.

## 2016-07-02 NOTE — Progress Notes (Signed)
Occupational Therapy Session Note  Patient Details  Name: Cheryl ClinesLaura Gibbs MRN: [id number] Date of Birth: 10/26/1990  Today's Date: 07/02/2016 OT Individual Time: 1430-1500 OT Individual Time Calculation (min): 30 min    Short Term Goals: Week 5:  OT Short Term Goal 1 (Week 5): Pt will be able to stand with mod A without UE support so she can A with adjusting clothing over hips. OT Short Term Goal 2 (Week 5): Pt will complete squat pivots to toilet consistently with mod A or less.   Skilled Therapeutic Interventions/Progress Updates:    Upon entering the room, pt seated in wheelchair awaiting therapist. Pt requested standing to fix LB clothing with sit <>stand from wheelchair with mod lifting assistance. Pt correcting clothing with min A for balance. OT propelled pt to dayroom for standing pipe tree task with increased time for moderate difficulty pattern. OT blocking L knee and pt needing min A - mod A for standing balance for 12 minutes this session. Pt returned to room and performed stand pivot transfer to bed with mod A and mod sit >supine for B LEs. Pt supine in bed with call bell and all needed items within reach upon exiting the room.   Therapy Documentation Precautions:  Precautions Precautions: Fall Precaution Comments: helmet when OOB Restrictions Weight Bearing Restrictions: No General:   Vital Signs: Therapy Vitals Temp: 97.7 F (36.5 C) Temp Source: Oral Pulse Rate: 80 Resp: 20 BP: 109/71 Patient Position (if appropriate): Sitting Oxygen Therapy SpO2: 98 % O2 Device: Not Delivered Pain: Pain Assessment Pain Assessment: 0-10 Pain Score: 6  Pain Type: Acute pain Pain Location: Arm Pain Orientation: Left Pain Descriptors / Indicators: Aching Pain Frequency: Intermittent Pain Onset: Gradual Patients Stated Pain Goal: 0 Pain Intervention(s): Medication (See eMAR) Multiple Pain Sites: No 2nd Pain Site Pain Score: 6 ADL: ADL ADL Comments: refer to  navigator Exercises:   Other Treatments:    See Function Navigator for Current Functional Status.   Therapy/Group: Individual Therapy  Alen BleacherBradsher, Liandra Mendia P 07/02/2016, 5:17 PM

## 2016-07-02 NOTE — Progress Notes (Signed)
Rung call light in regards of not wanting to wear RLE PRAFO boot. Pt stating of leg hurting and did not want to wear boot anymore. Aide, Crystal, helped take off boot per patient's wishes.

## 2016-07-02 NOTE — Progress Notes (Signed)
Speech Language Pathology Note  Patient Details  Name: Cheryl ClinesLaura Gibbs MRN: [id number] Date of Birth: 08/06/1990 Today's Date: 07/02/2016  MBSS complete. Full report located under chart review in imaging section.    Lucas Exline 07/02/2016, 12:44 PM

## 2016-07-02 NOTE — Progress Notes (Signed)
Physical Therapy Note  Patient Details  Name: Cheryl ClinesLaura Gibbs MRN: [id number] Date of Birth: 03/19/1991 Today's Date: 07/02/2016    Time: 1300-1410 70 minutes  1:1 No c/o pain.  Standing balance training without UE support with focus on reaching all directions to assist with self care. Pt improved standing balance, able to stand with mod A with reaching tasks.  Gait with hemiwalker 30' x 3 with mod A for balance, continues to require total A for Lt LE advancement but improving stance control.  Seated AAROM with Lt UE with pt able to activate shoulder flexion and IR with tapping.   DONAWERTH,KAREN 07/02/2016, 2:14 PM

## 2016-07-02 NOTE — Patient Care Conference (Signed)
Inpatient RehabilitationTeam Conference and Plan of Care Update Date: 06/30/2016   Time: 2:30 PM    Patient Name: Cheryl Gibbs      Medical Record Number: [id number]  Date of Birth: 23-Dec-1990 Sex: Female         Room/Bed: 4W16C/4W16C-01 Payor Info: Payor: Monia Pouch / Plan: Derrek Gu / Product Type: *No Product type* /    Admitting Diagnosis: Postpartum  Admit Date/Time:  05/20/2016  5:38 PM Admission Comments: No comment available   Primary Diagnosis:  Hemorrhage, intracranial (HCC) Principal Problem: Hemorrhage, intracranial (HCC)  Patient Active Problem List   Diagnosis Date Noted  . Posttraumatic stress disorder   . Adjustment disorder with depressed mood   . Hypotension   . Bradycardia   . Hemorrhage, intracranial (HCC) 05/20/2016  . Global aphasia   . Spastic hemiparesis affecting nondominant side (HCC)   . Vascular headache   . Dysphagia, post-stroke   . Tracheostomy status (HCC)   . Seizure (HCC)   . Neuropathy (HCC)   . Nonverbal     Expected Discharge Date: Expected Discharge Date: 07/09/16  Team Members Present: Physician leading conference: Dr. Faith Rogue Social Worker Present: Amada Jupiter, LCSW Nurse Present: Chana Bode, RN PT Present: Aleda Grana, PT;Other (comment) Judieth Keens, PT) OT Present: Roney Mans, OT;Ardis Rowan, COTA SLP Present: Feliberto Gottron, SLP PPS Coordinator present : Tora Duck, RN, CRRN     Current Status/Progress Goal Weekly Team Focus  Medical   improving intake. ?PEG out this week  improve communication, decrease pain, increase PO intake  nutrition, PEG removal potentially this week. spasticity mgt   Bowel/Bladder   Continet of BB. LBM 2/5  To have regular bowel/bladder  Assess bowel/bladder function Q shift and as needed   Swallow/Nutrition/ Hydration   Dys. 3 textures with nectar-thick liquids, water protocol   Min A with least restrictive diet  repeat MBS   ADL's   increased sensitivity to touch on L hand and  foot, mod A squat pivot and sit to stand, mod -max standing balance without UE support so she can use R hand with self care, min UB self care, max to total with LB dressing, max toileting  mod A standing balance, toilet transfer. max A with LB dressing; min A bathing and UB dressing; max A toileting (tub transfer goal is being worked on in therapy, but no goal due to no accessible set up at home at this time)  ADL retraining, balance, trunk control, LUE PROM, activity tolerance, cognitive activities, pt / family education   Mobility   mod/max transfers, w/c mobility with supervision in controlled environment  max assist transfers, supervision w/c mobility  pt/family education, d/c planning, safety awareness, transfers, L attention, w/c mobility   Communication   Min A for verbal expression  Mod I for multimodal communication  expanding length of utterance    Safety/Cognition/ Behavioral Observations  Min A  Min A  awareness, problem solving, recall    Pain   Compaline of pain of 4/10, Norco PRN given  <2  assess and treat pain q shift and as needed   Skin   Peg site, dry clean and intact  no skin breakdown/infection  assess and address any skin issues q shift and as needed    Rehab Goals Patient on target to meet rehab goals: Yes *See Care Plan and progress notes for long and short-term goals.  Barriers to Discharge: left hemiparesis, left inattention, timing of flap replacement    Possible Resolutions to  Barriers:  continued family education, coordinating surgical plan with dc from rehab    Discharge Planning/Teaching Needs:  Mother confirms plan for pt to come home with her and she will provide/ coordinate 24/7 physical assistance.  Still with education needs.  Mother to spend entire day with team on Friday 2/9   Team Discussion:  Coordination of [facility] transfer - MD to follow up.  Improved eating and expect to remove peg tube on Thursday.  Still with intermittent touch sensitivity.   Working on increasing utterances to sentence length.  Transfers and standing balance improving. Still need more education time with pt's mother.  Revisions to Treatment Plan:  None   Continued Need for Acute Rehabilitation Level of Care: The patient requires daily medical management by a physician with specialized training in physical medicine and rehabilitation for the following conditions: Daily direction of a multidisciplinary physical rehabilitation program to ensure safe treatment while eliciting the highest outcome that is of practical value to the patient.: Yes Daily medical management of patient stability for increased activity during participation in an intensive rehabilitation regime.: Yes Daily analysis of laboratory values and/or radiology reports with any subsequent need for medication adjustment of medical intervention for : Neurological problems;Post surgical problems  Henrique Parekh 07/02/2016, 9:50 AM

## 2016-07-02 NOTE — Progress Notes (Signed)
Social Work Patient ID: Cheryl Gibbs, female   DOB: 1990-09-06, 26 y.o.   MRN: [id number]  Met with pt and mother following team conference on Tuesday.  Both aware that MD/ PA are awaiting further discussion about transfer plans with [facility].  Discussed need for much more teaching/ hands on training for mother.  She continues to be vague about her availability but eventually commits to spending the day on Friday.  Mother also states that she has not yet ordered the STEDY lift as she was planning.  Explained to her that this needs to be done ASAP or therapies need to approach transfer training as if she will not have that available.  Mother to contact her "friend who works for an [employer]/field se[employer]" to get this ordered.  Will follow up with mother today to confirm she will be here tomorrow and that STEDY has been ordered.  Avigail Pilling, LCSW

## 2016-07-02 NOTE — Progress Notes (Signed)
Nurse tech went into her room to toilet & collect vitals. Patient declined going to the restroom. This nurse went in & ask patient to go to the restroom & she stated "no, sleep." Asked patient if she was sure & that I could take her. She declined.

## 2016-07-02 NOTE — Progress Notes (Signed)
Occupational Therapy Session Note  Patient Details  Name: Cheryl Gibbs MRN: [id number] Date of Birth: 1990/11/29  Today's Date: 07/02/2016 OT Individual Time: 1000-1100 OT Individual Time Calculation (min): 60 min    Short Term Goals: Week 5:  OT Short Term Goal 1 (Week 5): Pt will be able to stand with mod A without UE support so she can A with adjusting clothing over hips. OT Short Term Goal 2 (Week 5): Pt will complete squat pivots to toilet consistently with mod A or less.   Skilled Therapeutic Interventions/Progress Updates:    Pt seen for ADL training of shower and dressing with a focus on the STGs of transfers and standing balance.  Pt did not want to toilet before or after the shower today.  Her transfer OOB to chair and on and off tub bench were min A over all (guiding A with lowering smoothly and rotation of torso to chair.). A needed for LLE placement.  She continues to do well standing with support, but without was needing mod-max today. She did pull pants 50% of the way over her hips. Declined brushing teeth.  Pt in w/c with quick release belt, helmet on and all other needs met.  Therapy Documentation Precautions:  Precautions Precautions: Fall Precaution Comments: helmet when OOB Restrictions Weight Bearing Restrictions: No    Vital Signs: Therapy Vitals Temp: 97.7 F (36.5 C) Temp Source: Oral Pulse Rate: (!) 49 Resp: 18 BP: 95/64 Patient Position (if appropriate): Lying Oxygen Therapy SpO2: 99 % O2 Device: Not Delivered   Pain: no c/o pain   ADL: ADL ADL Comments: refer to navigator  See Function Navigator for Current Functional Status.   Therapy/Group: Individual Therapy  [place] 07/02/2016, 8:47 AM

## 2016-07-02 NOTE — Progress Notes (Signed)
Speech Language Pathology Daily Session Note  Patient Details  Name: Cheryl Gibbs MRN: [id number] Date of Birth: 08/25/1990  Today's Date: 07/02/2016 SLP Individual Time: 1230-1300 SLP Individual Time Calculation (min): 30 min  Short Term Goals: Week 6: SLP Short Term Goal 1 (Week 6): Patient will verbally express her wants/needs at the phrase level with supervision verbal cues to self-monitor and correct errors.  SLP Short Term Goal 2 (Week 6): Patient will demonstrate 90% intelligibility at the phrase level with supervision multimodal cues.  SLP Short Term Goal 3 (Week 6): Patient will consume trials of thin liquids with minimal overt s/s of aspiration and demonstrate a timely swallow initiation (less than 5 seconds) in 90% of opportunities with Min A verbal cues.  SLP Short Term Goal 4 (Week 6): Patient will demonstrate functional problem solving with functional tasks with supervision verbal cues.  SLP Short Term Goal 5 (Week 6): Patient will recall new, daily information with supervision verbal cues.   Skilled Therapeutic Interventions: Skilled treatment session focused on dysphagia and speech goals. SLP facilitated session by providing skilled observation with lunch meal of Dys. 3 textures with thin liquids. Patient consumed meal without overt s/s of aspiration and was Mod I for use of swallowing compensatory strategies. Patient also verbalized at the sentence level with extra time and Min A verbal cues to self-monitor and correct errors. Patient left upright in bed with all needs within reach. Continue with current plan of care.      Function:  Eating Eating   Modified Consistency Diet: Yes Eating Assist Level: Set up assist for;Swallowing techniques: self managed   Eating Set Up Assist For: Opening containers       Cognition Comprehension Comprehension assist level: Understands basic 75 - 89% of the time/ requires cueing 10 - 24% of the time  Expression   Expression assist level:  Expresses basic 75 - 89% of the time/requires cueing 10 - 24% of the time. Needs helper to occlude trach/needs to repeat words.  Social Interaction Social Interaction assist level: Interacts appropriately 75 - 89% of the time - Needs redirection for appropriate language or to initiate interaction.  Problem Solving Problem solving assist level: Solves basic 75 - 89% of the time/requires cueing 10 - 24% of the time  Memory Memory assist level: Recognizes or recalls 75 - 89% of the time/requires cueing 10 - 24% of the time    Pain No/Denies Pain   Therapy/Group: Individual Therapy  Adiel Erney 07/02/2016, 3:16 PM

## 2016-07-02 NOTE — Progress Notes (Signed)
[place] PHYSICAL MEDICINE & REHABILITATION     PROGRESS NOTE    Subjective/Complaints: Up in bed. Waiting for me to remove PEG   Review of Systems  Cardiovascular: Negative for palpitations.  Gastrointestinal: Negative for vomiting.  Genitourinary: Negative for urgency.  Musculoskeletal: Positive for myalgias.  Neurological: Positive for sensory change and speech change.  Psychiatric/Behavioral: Negative for suicidal ideas.      Objective: Vital Signs: Blood pressure 95/64, pulse (!) 49, temperature 97.7 F (36.5 C), temperature source Oral, resp. rate 18, height 5\' 4"  (1.626 m), weight 81.1 kg (178 lb 14.4 oz), SpO2 99 %. No results found.  Recent Labs  07/01/16 0842  WBC 6.4  HGB 15.0  HCT 47.5*  PLT 299    Recent Labs  07/01/16 0842  NA 143  K 4.1  CL 106  GLUCOSE 87  BUN 8  CREATININE 0.65  CALCIUM 10.0   CBG (last 3)  No results for input(s): GLUCAP in the last 72 hours.  Wt Readings from Last 3 Encounters:  06/17/16 81.1 kg (178 lb 14.4 oz)  05/20/16 84.4 kg (186 lb 1.6 oz)    Physical Exam:  Constitutional: She appears well-developed and well-nourished.  HENT: crani site clean, persistent fluid collection at craniectomy site   Eyes: EOMI. No discharge.   Neck: Trach stoma healed    Cardiovascular:RRR Respiratory: CTA bilaterally  GI: Soft. Bowel sounds are normal. PEG site remains clean/dry.  Musculoskeletal: No edema. Neurological: limproved engagement, initiating conversation.  Spastic left hemiparesis tr to 1/4 flexor tone LUE and 1/4 LLE. Stable. Left hemisensory deficits-no changes today Moves RUE/RLE grossly 5/5. Wiggled left leg today LUE and LLE still with tenderness during ROM but better Skin: Skin is warm and dry.  Psychiatric: pleasant and interactive  Assessment/Plan: 1. Dense left hemiparesis and aphasia secondary to right frontal IPH/IVH which require 3+ hours per day of interdisciplinary therapy in a comprehensive  inpatient rehab setting. Physiatrist is providing close team supervision and 24 hour management of active medical problems listed below. Physiatrist and rehab team continue to assess barriers to discharge/monitor patient progress toward functional and medical goals.  Function:  Bathing Bathing position   Position: Shower  Bathing parts Body parts bathed by patient: Left arm, Chest, Abdomen, Front perineal area, Left upper leg, Right upper leg, Right arm, Left lower leg, Right lower leg Body parts bathed by helper: Buttocks, Back  Bathing assist Assist Level: Touching or steadying assistance(Pt > 75%)      Upper Body Dressing/Undressing Upper body dressing   What is the patient wearing?: Pull over shirt/dress     Pull over shirt/dress - Perfomed by patient: Thread/unthread right sleeve, Put head through opening, Pull shirt over trunk Pull over shirt/dress - Perfomed by helper: Thread/unthread left sleeve Button up shirt - Perfomed by patient: Thread/unthread right sleeve Button up shirt - Perfomed by helper: Thread/unthread left sleeve, Pull shirt around back, Button/unbutton shirt    Upper body assist Assist Level:  (Mod A)      Lower Body Dressing/Undressing Lower body dressing   What is the patient wearing?: Pants, Socks, Shoes     Pants- Performed by patient: Thread/unthread right pants leg Pants- Performed by helper: Thread/unthread left pants leg, Pull pants up/down   Non-skid slipper socks- Performed by helper: Don/doff left sock Socks - Performed by patient: Don/doff right sock Socks - Performed by helper: Don/doff right sock, Don/doff left sock Shoes - Performed by patient: Don/doff right shoe Shoes - Performed by helper:  Don/doff right shoe, Don/doff left shoe, Fasten right, Fasten left   AFO - Performed by helper: Don/doff left AFO      Lower body assist Assist for lower body dressing:  (Max A)      Toileting Toileting Toileting activity did not occur:  Refused Toileting steps completed by patient: Performs perineal hygiene Toileting steps completed by helper: Adjust clothing prior to toileting, Adjust clothing after toileting Toileting Assistive Devices: Grab bar or rail  Toileting assist Assist level: Two helpers   Transfers Chair/bed transfer   Chair/bed transfer method: Stand pivot Chair/bed transfer assist level: Moderate assist (Pt 50 - 74%/lift or lower) Chair/bed transfer assistive device: Armrests Mechanical lift: Stedy   Locomotion Ambulation Ambulation activity did not occur: Safety/medical concerns   Max distance: 60 ft Assist level: Moderate assist (Pt 50 - 74%)   Wheelchair   Type: Manual Max wheelchair distance: 150 ft Assist Level: Supervision or verbal cues  Cognition Comprehension Comprehension assist level: Understands basic 75 - 89% of the time/ requires cueing 10 - 24% of the time  Expression Expression assist level: Expresses basic 75 - 89% of the time/requires cueing 10 - 24% of the time. Needs helper to occlude trach/needs to repeat words.  Social Interaction Social Interaction assist level: Interacts appropriately 75 - 89% of the time - Needs redirection for appropriate language or to initiate interaction.  Problem Solving Problem solving assist level: Solves basic 75 - 89% of the time/requires cueing 10 - 24% of the time  Memory Memory assist level: Recognizes or recalls 75 - 89% of the time/requires cueing 10 - 24% of the time     Medical Problem List and Plan: 1.  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  -cont CIR-PT, OT, SLP--   -continue  helmet when OOB  -Dr. Lionel DecemberFargen, NS/[facility]---cranioplasty now scheduled for 2/15---working on facilitating logistics of procedure.   -family ed continues 2.  DVT Prophylaxis/Anticoagulation: Mechanical: Sequential compression devices, below knee Bilateral lower extremities. LLE dopplers 12/26  negative for DVT 3. Headaches/Pain Management: controlled at present  -extremity pain is neuropathic.    -maintain gabapentin at 600mg  TID          -continue hydrocodone at 10mg  prn  -consider TCA at bedtime  -overall appears improved 4. Mood: LCSW to follow for evaluation and support when appropriate.   -depression has been an issue---seems to be doing a little better  -zoloft at 100mg  daily    -adderall 20mg  XR for initiation/engagement (was also using at home for ADHD)   5. Neuropsych: This patient is not capable of making decisions on her own behalf. 6. Skin/Wound Care: remove remaining sutures.  7. Fluids/Electrolytes/Nutrition:  D3/nectars per SLP- has been eating better---MBS today  -eating more  -removed PEG today without traction--no complications   -pressure dressing applied  - all meds   pill form with applesauce 8. VDRF:  trach stoma closed 9. New onset seizures: Continue Keppra bid. Changed to 500mg  tablet 10. Spasticity/Neuropathy LUE/LLE: see above  -baclofen  20 TID  , increased to QID with positive effects  -LLE ROM, splinting     11. Hypotension/bradycardia   Vitals:   07/01/16 1314 07/02/16 0548  BP: 109/64 95/64  Pulse: 72 (!) 49  Resp: 17 18  Temp: 98.5 F (36.9 C) 97.7 F (36.5 C)    .  LOS (Days) 43 A FACE TO FACE EVALUATION WAS PERFORMED  Ranelle OysterSWARTZ,Naveh Rickles T, MD 07/02/2016 9:41 AM

## 2016-07-03 ENCOUNTER — Inpatient Hospital Stay (HOSPITAL_COMMUNITY): Payer: Managed Care, Other (non HMO) | Admitting: Speech Pathology

## 2016-07-03 ENCOUNTER — Inpatient Hospital Stay (HOSPITAL_COMMUNITY): Payer: Managed Care, Other (non HMO) | Admitting: Occupational Therapy

## 2016-07-03 ENCOUNTER — Inpatient Hospital Stay (HOSPITAL_COMMUNITY): Payer: Managed Care, Other (non HMO) | Admitting: Physical Therapy

## 2016-07-03 NOTE — Progress Notes (Signed)
Speech Language Pathology Daily Session Note  Patient Details  Name: Cheryl ClinesLaura Gibbs MRN: [id number] Date of Birth: 05/16/1991  Today's Date: 07/03/2016 SLP Individual Time: 1330-1400 SLP Individual Time Calculation (min): 30 min  Short Term Goals: Week 7: SLP Short Term Goal 1 (Week 7): Patient will demonstrate functional problem solving with functional tasks with supervision verbal cues.  SLP Short Term Goal 2 (Week 7): Patient will recall new, daily information with supervision verbal cues.  SLP Short Term Goal 3 (Week 7): Patient will consume current diet without overt s/s of aspiration and Mod I for use of swallowing compensatory strategies.  SLP Short Term Goal 4 (Week 7): Patient will demonstrate efficient mastication and complete oral clearance with trials of regular textures over 2 sessions prior to upgrade.  SLP Short Term Goal 5 (Week 7): Patient will verbalize at the sentence level with extra time and Min A verbal cues to self-monitor and correct errors.   Skilled Therapeutic Interventions: Patient participated in a verbal description task and required Min A verbal cues to increase length of utterance with use of grammar and to self-monitor and correct verbal errors. Patient left upright in bed with all needs within reach. Continue with current plan of care.      Function:  Cognition Comprehension Comprehension assist level: Understands basic 75 - 89% of the time/ requires cueing 10 - 24% of the time  Expression   Expression assist level: Expresses basic 75 - 89% of the time/requires cueing 10 - 24% of the time. Needs helper to occlude trach/needs to repeat words.  Social Interaction Social Interaction assist level: Interacts appropriately 75 - 89% of the time - Needs redirection for appropriate language or to initiate interaction.  Problem Solving Problem solving assist level: Solves basic 75 - 89% of the time/requires cueing 10 - 24% of the time  Memory Memory assist level:  Recognizes or recalls 75 - 89% of the time/requires cueing 10 - 24% of the time    Pain No reports of pain   Therapy/Group: Individual Therapy  Shweta Aman 07/03/2016, 3:52 PM

## 2016-07-03 NOTE — Progress Notes (Signed)
Physical Therapy Note  Patient Details  Name: Cheryl Gibbs MRN: [id number] Date of Birth: 07/16/1990 Today's Date: 07/03/2016    Time: 1100-1155 55 minutes  1:1 No c/o pain.  Session focused on pt/family ed with pt's mother.  Pt's mother performed simulated car transfer and furniture transfers with cues for safe set up, pt/mother able to perform without physical assist.  Pt's mother asks about gait at home, PT emphasized that pt should not perform gait at home, on with HHPT.  Pt performed nustep x 8 minutes for LE strengthening, pt/mother encouraged to look for a gym with a nu step vs trying an exercise bike at home.  Gait as demo for mother with hemiwalker with PT assisting total A for Lt LE advancement, min/mod A for wt shifts and balance.  PT continued to emphasize pt should not attempt gait at home without PT.  Pt/mother verbalize understanding.   Velta Rockholt 07/03/2016, 11:53 AM

## 2016-07-03 NOTE — Progress Notes (Signed)
Occupational Therapy Weekly Progress Note  Patient Details  Name: Cheryl Gibbs MRN: [id number] Date of Birth: 1991-04-11  Beginning of progress report period: June 26, 2016 End of progress report period: July 04, 2015  Today's Date: 07/03/2016 OT Individual Time: 0930-1030 OT Individual Time Calculation (min): 60 min    Patient has met 2 of 2 short term goals.  Pt has been making strong progress with the consistency of her performing squat pivot transfers with mod A or less and maintaining standing balance with mod A or less so she can use her R hand freely to engage in LB self care.  She continues to need cues for safe set up for transfers as far as head/hip ratio.  No changes observed in LUE as it continues with hypertonicity.  Pt continues to have impaired awareness of how her current skills with translate at home. She believes there are many things she will do more easily once she is at home and needs frequent education to maintain safety at home.  Initiation of family ed this week with her mother who also has difficulty with this (see note below)  Patient continues to demonstrate the following deficits: abnormal tone, decreased visual perceptual skills, decreased visual motor skills and hemianopsia, decreased attention to left, decreased awareness, decreased problem solving, decreased safety awareness, decreased memory and delayed processing and decreased sitting balance, decreased standing balance, decreased postural control, hemiplegia and decreased balance strategies and therefore will continue to benefit from skilled OT intervention to enhance overall performance with BADL.  Patient progressing toward long term goals..  Continue plan of care.  OT Short Term Goals Week 5:  OT Short Term Goal 1 (Week 5): Pt will be able to stand with mod A without UE support so she can A with adjusting clothing over hips. OT Short Term Goal 1 - Progress (Week 5): Met OT Short Term Goal 2 (Week 5): Pt  will complete squat pivots to toilet consistently with mod A or less.  OT Short Term Goal 2 - Progress (Week 5): Met Week 6:  OT Short Term Goal 1 (Week 6): STGs = LTGs  Skilled Therapeutic Interventions/Progress Updates:    Family education this session with pt's mother for ADL retraining.  Discussed with mom the pt's progress and the areas she is continuing to have difficulty which is primarily the severely limited AROM of LLE which impacts transfers and balance. She is now safe to complete squat pivot transfers with min - mod A but NOT stand step for shower due to limited AROM of LLE.  Told mother that today we would demonstrate how to do these squat pivots to toilet and in and out of shower and then I would like her to practice hands on for standard transfers with PT.  In this session, demonstration to mother with pt: Rolling to sidelying on R, sidelying to sit (without rails and flat bed) Squat pivot to L to w/c Squat pivot to and from [facility] over toilet Sit to stand Using R hand on bar at toilet Standing with min A for pt to cleanse self Squat pivot onto tub bench Bathing with adaptive strategies Standing with bar in shower for OT to A with bathing bottom ( do not recommend for pt to try as she does not have helmet on in shower and it is a slippery environment) Transfer back to chair Adaptive techniques for dressing Sit to stand and balance for pt to pull pants over hips 70% of the way. Reviewed  with pt and mother that based on the 3 bathroom options (one is a very deep garden tub that has to be stepped down into, one is a walkin tub that has a narrow door with high threshold that has to be stepped over into, and one large shower with glass doors with a threshold), the only possibility is squat pivots to bench in large shower (with all the glass removed and a shower curtain installed) BUT it is imperative to wait for home health OT to assess to ensure the w/c could be close enough to bench. After  this discussion, mother asked, "well cant we just use the hemiwalker to have her step over the ledge?"  Explained for the 2nd time why it is dangerous at this time- she can not actively pick up left leg to step over ledge and can not support her body wt on L leg to lift R over.  Mother nodded that she understood and pt seemed annoyed that I was recommending not to do this.   Pt's SLP therapist arrived for the next session.  Therapy Documentation Precautions:  Precautions Precautions: Fall Precaution Comments: helmet when OOB Restrictions Weight Bearing Restrictions: No    Vital Signs: Therapy Vitals Temp: 97.7 F (36.5 C) Temp Source: Oral Pulse Rate: (!) 55 Resp: 18 BP: (!) 104/59 Patient Position (if appropriate): Lying Oxygen Therapy SpO2: 98 % O2 Device: Not Delivered Pain:  no c/o pain during session except occasional soreness from where gtube was removed ADL: ADL ADL Comments: refer to navigator     See Function Navigator for Current Functional Status.   Therapy/Group: Individual Therapy  [place] 07/03/2016, 8:32 AM

## 2016-07-03 NOTE — Progress Notes (Signed)
[place] PHYSICAL MEDICINE & REHABILITATION     PROGRESS NOTE    Subjective/Complaints: Belly a little sore. Feels better than yesterday. Eating well. Happy that diet was advanced.   Review of Systems  Cardiovascular: Negative for palpitations.  Gastrointestinal: Negative for vomiting.  Genitourinary: Negative for urgency.  Musculoskeletal: Positive for myalgias.  Neurological: Positive for sensory change and speech change.  Psychiatric/Behavioral: Negative for suicidal ideas.      Objective: Vital Signs: Blood pressure (!) 104/59, pulse (!) 55, temperature 97.7 F (36.5 C), temperature source Oral, resp. rate 18, height 5\' 4"  (1.626 m), weight 81.1 kg (178 lb 14.4 oz), SpO2 98 %. Dg Swallowing Func-speech Pathology  Result Date: 07/02/2016 Objective Swallowing Evaluation: Type of Study: MBS-Modified Barium Swallow Study Patient Details Name: Cheryl ClinesLaura Gibbs MRN: [id number] Date of Birth: 06/28/1990 Today's Date: 07/02/2016 Time: [id number] 15 min Past Medical History: Past Medical History: Diagnosis Date . ADD (attention deficit disorder)  . Occipital neuralgia  Past Surgical History: Past Surgical History: Procedure Laterality Date . CESAREAN SECTION  03/31/2016 HPI: see H&P No Data Recorded Assessment / Plan / Recommendation CHL IP CLINICAL IMPRESSIONS 07/02/2016 Therapy Diagnosis Mild pharyngeal phase dysphagia;Mild oral phase dysphagia Clinical Impression Patient demonstrates a mild oral and pharyngeal sensory based dysphagia. Oral phase is characterized by impaired mastication of solid textures and left anterior spillage of all consistencies. Pharyngeal phase is characterized by delayed swallow initiation and impaired timing/coordination of swallow resulting in intermittent flash penetration of thin liquids via cup and straw.  Recommend patient continue Dys. 3 textures and upgrade to thin liquids. Discussed results with patient, she verbalized understanding.  Impact on safety and function Mild  aspiration risk   CHL IP TREATMENT RECOMMENDATION 07/02/2016 Treatment Recommendations Therapy as outlined in treatment plan below   Prognosis 07/02/2016 Prognosis for Safe Diet Advancement Fair Barriers to Reach Goals Cognitive deficits Barriers/Prognosis Comment -- CHL IP DIET RECOMMENDATION 07/02/2016 SLP Diet Recommendations Dysphagia 3 (Mech soft) solids;Thin liquid Liquid Administration via Cup;No straw Medication Administration Whole meds with puree Compensations Minimize environmental distractions;Slow rate;Small sips/bites;Monitor for anterior loss Postural Changes Remain semi-upright after after feeds/meals (Comment)   CHL IP OTHER RECOMMENDATIONS 07/02/2016 Recommended Consults -- Oral Care Recommendations Oral care BID Other Recommendations --   CHL IP FOLLOW UP RECOMMENDATIONS 07/02/2016 Follow up Recommendations Home health SLP;24 hour supervision/assistance   CHL IP FREQUENCY AND DURATION 07/02/2016 Speech Therapy Frequency (ACUTE ONLY) min 5x/week Treatment Duration 2 weeks      CHL IP ORAL PHASE 07/02/2016 Oral Phase Impaired Oral - Pudding Teaspoon -- Oral - Pudding Cup -- Oral - Honey Teaspoon -- Oral - Honey Cup -- Oral - Nectar Teaspoon -- Oral - Nectar Cup NT Oral - Nectar Straw -- Oral - Thin Teaspoon NT Oral - Thin Cup Left anterior bolus loss Oral - Thin Straw Left anterior bolus loss Oral - Puree NT Oral - Mech Soft NT Oral - Regular Left anterior bolus loss;Impaired mastication Oral - Multi-Consistency -- Oral - Pill -- Oral Phase - Comment --  CHL IP PHARYNGEAL PHASE 07/02/2016 Pharyngeal Phase Impaired Pharyngeal- Pudding Teaspoon -- Pharyngeal -- Pharyngeal- Pudding Cup -- Pharyngeal -- Pharyngeal- Honey Teaspoon -- Pharyngeal -- Pharyngeal- Honey Cup -- Pharyngeal -- Pharyngeal- Nectar Teaspoon -- Pharyngeal -- Pharyngeal- Nectar Cup NT Pharyngeal -- Pharyngeal- Nectar Straw -- Pharyngeal -- Pharyngeal- Thin Teaspoon NT Pharyngeal -- Pharyngeal- Thin Cup Delayed swallow initiation-pyriform  sinuses;Penetration/Aspiration during swallow Pharyngeal Material does not enter airway;Material enters airway, remains ABOVE vocal cords then ejected out  Pharyngeal- Thin Straw Delayed swallow initiation-pyriform sinuses;Penetration/Aspiration during swallow Pharyngeal Material enters airway, remains ABOVE vocal cords then ejected out Pharyngeal- Puree NT Pharyngeal -- Pharyngeal- Mechanical Soft NT Pharyngeal -- Pharyngeal- Regular Delayed swallow initiation-vallecula Pharyngeal -- Pharyngeal- Multi-consistency -- Pharyngeal -- Pharyngeal- Pill -- Pharyngeal -- Pharyngeal Comment --  CHL IP CERVICAL ESOPHAGEAL PHASE 07/02/2016 Cervical Esophageal Phase WFL Pudding Teaspoon -- Pudding Cup -- Honey Teaspoon -- Honey Cup -- Nectar Teaspoon -- Nectar Cup -- Nectar Straw -- Thin Teaspoon -- Thin Cup -- Thin Straw -- Puree -- Mechanical Soft -- Regular -- Multi-consistency -- Pill -- Cervical Esophageal Comment -- No flowsheet data found. Cheryl Gibbs 07/02/2016, 12:40 PM               Recent Labs  07/01/16 0842  WBC 6.4  HGB 15.0  HCT 47.5*  PLT 299    Recent Labs  07/01/16 0842  NA 143  K 4.1  CL 106  GLUCOSE 87  BUN 8  CREATININE 0.65  CALCIUM 10.0   CBG (last 3)  No results for input(s): GLUCAP in the last 72 hours.  Wt Readings from Last 3 Encounters:  06/17/16 81.1 kg (178 lb 14.4 oz)  05/20/16 84.4 kg (186 lb 1.6 oz)    Physical Exam:  Constitutional: She appears well-developed and well-nourished.  HENT: crani site clean, persistent, significant fluid collection at craniectomy site   Eyes: EOMI. No discharge.   Neck: Trach stoma healed    Cardiovascular:RRR Respiratory: CTA bilaterally  GI: Soft. Bowel sounds are normal. Former PEG site closing, mild s/s drainage  Musculoskeletal: No edema. Neurological: limproved engagement, initiating conversation.  Spastic left hemiparesis tr to 1/4 flexor tone LUE and 1/4 LLE. Stable. Left hemisensory deficits-no changes today Moves  RUE/RLE grossly 5/5. Wiggled left leg today LUE and LLE still with tenderness during ROM but better Skin: Skin is warm and dry.  Psychiatric: pleasant and interactive  Assessment/Plan: 1. 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 Physiatrist is providing close team supervision and 24 hour management of active medical problems listed below. Physiatrist and rehab team continue to assess barriers to discharge/monitor patient progress toward functional and medical goals.  Function:  Bathing Bathing position   Position: Shower  Bathing parts Body parts bathed by patient: Left arm, Chest, Abdomen, Front perineal area, Left upper leg, Right upper leg, Right arm, Left lower leg, Right lower leg Body parts bathed by helper: Buttocks, Back  Bathing assist Assist Level: Touching or steadying assistance(Pt > 75%)      Upper Body Dressing/Undressing Upper body dressing   What is the patient wearing?: Pull over shirt/dress     Pull over shirt/dress - Perfomed by patient: Thread/unthread right sleeve, Put head through opening, Pull shirt over trunk Pull over shirt/dress - Perfomed by helper: Thread/unthread left sleeve Button up shirt - Perfomed by patient: Thread/unthread right sleeve Button up shirt - Perfomed by helper: Thread/unthread left sleeve, Pull shirt around back, Button/unbutton shirt    Upper body assist Assist Level:  (Mod A)      Lower Body Dressing/Undressing Lower body dressing   What is the patient wearing?: Pants, Socks, Shoes     Pants- Performed by patient: Thread/unthread right pants leg Pants- Performed by helper: Thread/unthread left pants leg, Pull pants up/down   Non-skid slipper socks- Performed by helper: Don/doff left sock Socks - Performed by patient: Don/doff right  sock Socks - Performed by helper: Don/doff right sock, Don/doff  left sock Shoes - Performed by patient: Don/doff right shoe Shoes - Performed by helper: Don/doff right shoe, Don/doff left shoe, Fasten right, Fasten left   AFO - Performed by helper: Don/doff left AFO      Lower body assist Assist for lower body dressing:  (Max A)      Toileting Toileting Toileting activity did not occur: Refused Toileting steps completed by patient: Performs perineal hygiene Toileting steps completed by helper: Adjust clothing prior to toileting, Adjust clothing after toileting Toileting Assistive Devices: Grab bar or rail  Toileting assist Assist level: Two helpers   Transfers Chair/bed transfer   Chair/bed transfer method: Stand pivot Chair/bed transfer assist level: Moderate assist (Pt 50 - 74%/lift or lower) Chair/bed transfer assistive device: Armrests Mechanical lift: Stedy   Locomotion Ambulation Ambulation activity did not occur: Safety/medical concerns   Max distance: 60 ft Assist level: Moderate assist (Pt 50 - 74%)   Wheelchair   Type: Manual Max wheelchair distance: 150 ft Assist Level: Supervision or verbal cues  Cognition Comprehension Comprehension assist level: Understands basic 75 - 89% of the time/ requires cueing 10 - 24% of the time  Expression Expression assist level: Expresses basic 75 - 89% of the time/requires cueing 10 - 24% of the time. Needs helper to occlude trach/needs to repeat words.  Social Interaction Social Interaction assist level: Interacts appropriately 75 - 89% of the time - Needs redirection for appropriate language or to initiate interaction.  Problem Solving Problem solving assist level: Solves basic 75 - 89% of the time/requires cueing 10 - 24% of the time  Memory Memory assist level: Recognizes or recalls 75 - 89% of the time/requires cueing 10 - 24% of the time     Medical Problem List and Plan: 1.  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  -cont CIR-PT, OT, SLP--   -continue  helmet when OOB  -Dr. Lionel December, [facility]---cranioplasty now scheduled for 2/15---working on facilitating logistics of procedure. Also have concerns about fluid in craniectomy site. Want to make sure they are willing to do surgery  -family ed continues 2.  DVT Prophylaxis/Anticoagulation: Mechanical: Sequential compression devices, below knee Bilateral lower extremities. LLE dopplers 12/26 negative for DVT 3. Headaches/Pain Management: improved  -extremity pain is neuropathic.    -maintain gabapentin at 600mg  TID          -continue hydrocodone at 10mg  prn  -overall appears improved 4. Mood: LCSW to follow for evaluation and support when appropriate.   -depression a factor, but showing sign of improvement  -zoloft at 100mg  daily    -adderall 20mg  XR for initiation/engagement (was also using at home for ADHD)   5. Neuropsych: This patient is not capable of making decisions on her own behalf. 6. Skin/Wound Care: remove remaining sutures.  7. Fluids/Electrolytes/Nutrition:  D3/thins per SLP  -eating well  -removed PEG without issue   -continue dry dressing  - all meds   pill form with applesauce 8. VDRF:  trach stoma closed 9. New onset seizures: Continue Keppra bid. Changed to 500mg  tablet 10. Spasticity/Neuropathy LUE/LLE: see above  -baclofen  20 TID  , increased to QID with positive effects  -LLE ROM, splinting     11. Hypotension/bradycardia   Vitals:   07/02/16 1413 07/03/16 0631  BP: 109/71 (!) 104/59  Pulse: 80 (!) 55  Resp: 20 18  Temp: 97.7 F (36.5 C) 97.7  F (36.5 C)    .  LOS (Days) 44 A FACE TO FACE EVALUATION WAS PERFORMED  Faith Rogue T, MD 07/03/2016 10:06 AM

## 2016-07-03 NOTE — Progress Notes (Signed)
Speech Language Pathology Weekly Progress and Session Note  Patient Details  Name: Cheryl Gibbs MRN: [id number] Date of Birth: 03-10-1991  Beginning of progress report period: June 26, 2016 End of progress report period: July 03, 2016  Today's Date: 07/03/2016 SLP Individual Time: 1030-1100 SLP Individual Time Calculation (min): 30 min  Short Term Goals: Week 6: SLP Short Term Goal 1 (Week 6): Patient will verbally express her wants/needs at the phrase level with supervision verbal cues to self-monitor and correct errors.  SLP Short Term Goal 1 - Progress (Week 6): Met SLP Short Term Goal 2 (Week 6): Patient will demonstrate 90% intelligibility at the phrase level with supervision multimodal cues.  SLP Short Term Goal 2 - Progress (Week 6): Met SLP Short Term Goal 3 (Week 6): Patient will consume trials of thin liquids with minimal overt s/s of aspiration and demonstrate a timely swallow initiation (less than 5 seconds) in 90% of opportunities with Min A verbal cues.  SLP Short Term Goal 3 - Progress (Week 6): Met SLP Short Term Goal 4 (Week 6): Patient will demonstrate functional problem solving with functional tasks with supervision verbal cues.  SLP Short Term Goal 4 - Progress (Week 6): Not met SLP Short Term Goal 5 (Week 6): Patient will recall new, daily information with supervision verbal cues.  SLP Short Term Goal 5 - Progress (Week 6): Not met    New Short Term Goals: Week 7: SLP Short Term Goal 1 (Week 7): Patient will demonstrate functional problem solving with functional tasks with supervision verbal cues.  SLP Short Term Goal 2 (Week 7): Patient will recall new, daily information with supervision verbal cues.  SLP Short Term Goal 3 (Week 7): Patient will consume current diet without overt s/s of aspiration and Mod I for use of swallowing compensatory strategies.  SLP Short Term Goal 4 (Week 7): Patient will demonstrate efficient mastication and complete oral clearance with  trials of regular textures over 2 sessions prior to upgrade.  SLP Short Term Goal 5 (Week 7): Patient will verbalize at the sentence level with extra time and Min A verbal cues to self-monitor and correct errors.   Weekly Progress Updates: Patient has made functional gains and has met 3 of 5 STG's this reporting period due to improved verbal expression and swallowing function. Currently, patient is consuming Dys. 3 textures with thin liquids without overt s/s of aspiration and requires intermittent supervision verbal cues-Mod I for use of swallowing compensatory strategies. Plan to focus on trials of regular textures this upcoming week. Patient is also verbalizing the phrase level but requires Mod A verbal cues to increase length of utterance to the sentence level with addition of articles, prepositions, etc. Patient also continues to require overall Min A verbal cues for problem solving and recall of functional information. Patient and family education is ongoing. Patient would benefit from continued skilled SLP intervention to maximize her cognitive and swallowing function as well as her functional communication in order to maximize her overall functional independence prior to discharge.      Intensity: Minumum of 1-2 x/day, 30 to 90 minutes Frequency: 3 to 5 out of 7 days Duration/Length of Stay: 2/15 Treatment/Interventions: Functional tasks;Patient/family education;Cueing hierarchy;Dysphagia/aspiration precaution training;Therapeutic Activities;Cognitive remediation/compensation;Internal/external aids;Speech/Language facilitation   Daily Session  Skilled Therapeutic Interventions: Skilled treatment session focused on dysphagia goals and education with the patient's mom. Patient consumed minimal trials of regular textures with thin liquids with mildly prolonged mastication with minimal oral residue and without overt s/s  of aspiration. However, do not recommend upgrade due to limited trials.  Patient's mother present and educated on patient's current swallowing function, diet recommendations, appropriate textures and medication administration. Patient's mother verbalized understanding. Patient left upright in wheelchair with all needs within reach. Continue with current plan of care.       Function:   Eating Eating   Modified Consistency Diet: Yes Eating Assist Level: Set up assist for;Swallowing techniques: self managed   Eating Set Up Assist For: Opening containers       Cognition Comprehension Comprehension assist level: Understands basic 75 - 89% of the time/ requires cueing 10 - 24% of the time  Expression   Expression assist level: Expresses basic 75 - 89% of the time/requires cueing 10 - 24% of the time. Needs helper to occlude trach/needs to repeat words.  Social Interaction Social Interaction assist level: Interacts appropriately 75 - 89% of the time - Needs redirection for appropriate language or to initiate interaction.  Problem Solving Problem solving assist level: Solves basic 75 - 89% of the time/requires cueing 10 - 24% of the time  Memory Memory assist level: Recognizes or recalls 75 - 89% of the time/requires cueing 10 - 24% of the time   Pain Pain Assessment Pain Assessment: 0-10 Pain Score: 6  Faces Pain Scale: Hurts even more Pain Location: Head Pain Orientation: Left (forehead) Pain Radiating Towards:  (non radiating) Pain Descriptors / Indicators: Aching;Headache Pain Frequency: Intermittent Patients Stated Pain Goal: 0  Therapy/Group: Individual Therapy  Cheryl Gibbs 07/03/2016, 3:13 PM

## 2016-07-03 NOTE — Progress Notes (Addendum)
Physical Therapy Session Note  Patient Details  Name: Cheryl Gibbs MRN: [id number] Date of Birth: 09-15-1990  Today's Date: 07/03/2016 PT Individual Time: 8295-6213 PT Individual Time Calculation (min): 30 min   Short Term Goals: Week 5:  PT Short Term Goal 1 (Week 5): Pt will sequence squat pivot transfer with min cuing from therapist.  PT Short Term Goal 1 - Progress (Week 5): Met PT Short Term Goal 2 (Week 5): Pt will propel w/c with min cuing to attend to L. PT Short Term Goal 2 - Progress (Week 5): Progressing toward goal  Skilled Therapeutic Interventions/Progress Updates:    Pt in bed upon arrival. Encouraging mother to perform stand pivot transfer from bed>w/c. Mother stating that she has already done this but willing to do again. Pt able to assist mother with sequence, reminding to move armrest prior to transfer. Pt transported to gym for time where worked on ambulation with hemi walker. Repeating 25 ft X2 with total assist to advance LLE. With increased fatigue pt needed increased support and cues for gait sequence. Pt returned to room following. Up in w/c with safety belt and mother present.   Therapy Documentation Precautions:  Precautions Precautions: Fall Precaution Comments: helmet when OOB Restrictions Weight Bearing Restrictions: No Pain: Pt denies pain.   See Function Navigator for Current Functional Status.   Therapy/Group: Individual Therapy  Linard Millers, PT, CSCS 07/03/2016, 4:48 PM

## 2016-07-04 ENCOUNTER — Inpatient Hospital Stay (HOSPITAL_COMMUNITY): Admitting: Physical Therapy

## 2016-07-04 NOTE — Progress Notes (Signed)
Physical Therapy Session Note  Patient Details  Name: Cheryl Gibbs MRN: [id number] Date of Birth: 1990-11-15  Today's Date: 07/04/2016 PT Individual Time: 3468-8737 PT Individual Time Calculation (min): 32 min   Short Term Goals: Week 5:  PT Short Term Goal 1 (Week 5): Pt will sequence squat pivot transfer with min cuing from therapist.  PT Short Term Goal 1 - Progress (Week 5): Met PT Short Term Goal 2 (Week 5): Pt will propel w/c with min cuing to attend to L. PT Short Term Goal 2 - Progress (Week 5): Progressing toward goal Week 6:     Skilled Therapeutic Interventions/Progress Updates:   Pt received supine in bed and agreeable to PT. Supine<>sit transfer with min assist and min cues for LLE management.  PT donned Air cast and Bilateral shoes for time management.   Stand pivot and squat pivot transfers instructed by PT with min assist to the R and Mod assist to left x 2each.   WC mobility performed through hall with supervision assist from PT to navigate doorway and to find familiar locations in rehab x 151f   Gait with HW x 527fwith mod assist from PT for LLE limb advancement and knee stability to prevent genu recurvatum.   Patient returned too room and left supine in bed with call bell in reach and all needs met.          Therapy Documentation Precautions:  Precautions Precautions: Fall Precaution Comments: helmet when OOB Restrictions Weight Bearing Restrictions: No    Pain:   0/10   See Function Navigator for Current Functional Status.   Therapy/Group: Individual Therapy  AuLorie Phenix/02/2017, 10:22 AM

## 2016-07-04 NOTE — Progress Notes (Signed)
[place] PHYSICAL MEDICINE & REHABILITATION     PROGRESS NOTE    Subjective/Complaints: Pt seen laying in bed this AM. She is a little emotional.  She requests her hand brace be applied. She slept well overnight.   ROS Denies CP, SOB, N/V/D.  Objective: Vital Signs: Blood pressure 104/74, pulse 60, temperature 97.7 F (36.5 C), temperature source Oral, resp. rate 18, height 5\' 4"  (1.626 m), weight 81.1 kg (178 lb 14.4 oz), SpO2 95 %. No results found. No results for input(s): WBC, HGB, HCT, PLT in the last 72 hours. No results for input(s): NA, K, CL, GLUCOSE, BUN, CREATININE, CALCIUM in the last 72 hours.  Invalid input(s): CO CBG (last 3)  No results for input(s): GLUCAP in the last 72 hours.  Wt Readings from Last 3 Encounters:  06/17/16 81.1 kg (178 lb 14.4 oz)  05/20/16 84.4 kg (186 lb 1.6 oz)    Physical Exam:  Constitutional: She appears well-developed and well-nourished. NAD. HENT: crani site clean/dry/intact Eyes: EOMI. No discharge.   Neck: Trach stoma healed    Cardiovascular: RRR. No JVD. Respiratory: CTA bilaterally. Unlabored.  GI: Soft. Bowel sounds are normal.  Musculoskeletal: No edema. 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  Skin: Skin is warm and dry.  Psychiatric: pleasant and interactive, emotional today  Assessment/Plan: 1. 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 Physiatrist is providing close team supervision and 24 hour management of active medical problems listed below. Physiatrist and rehab team continue to assess barriers to discharge/monitor patient progress toward functional and medical goals.  Function:  Bathing Bathing position   Position: Shower  Bathing parts Body parts bathed by patient: Left arm,  Chest, Abdomen, Front perineal area, Left upper leg, Right upper leg, Right arm, Left lower leg, Right lower leg Body parts bathed by helper: Buttocks, Back  Bathing assist Assist Level: Touching or steadying assistance(Pt > 75%)      Upper Body Dressing/Undressing Upper body dressing   What is the patient wearing?: Pull over shirt/dress     Pull over shirt/dress - Perfomed by patient: Thread/unthread right sleeve, Put head through opening, Pull shirt over trunk Pull over shirt/dress - Perfomed by helper: Thread/unthread left sleeve Button up shirt - Perfomed by patient: Thread/unthread right sleeve Button up shirt - Perfomed by helper: Thread/unthread left sleeve, Pull shirt around back, Button/unbutton shirt    Upper body assist Assist Level:  (Mod A)      Lower Body Dressing/Undressing Lower body dressing   What is the patient wearing?: Pants, Socks, Shoes     Pants- Performed by patient: Thread/unthread right pants leg Pants- Performed by helper: Thread/unthread left pants leg, Pull pants up/down   Non-skid slipper socks- Performed by helper: Don/doff right sock, Don/doff left sock Socks - Performed by patient: Don/doff right sock Socks - Performed by helper: Don/doff left sock Shoes - Performed by patient: Don/doff right shoe Shoes - Performed by helper: Don/doff right shoe, Don/doff left shoe, Fasten right, Fasten left   AFO - Performed by helper: Don/doff left AFO      Lower body assist Assist for lower body dressing:  (Max A)      Toileting Toileting Toileting activity did not occur: Refused Toileting steps completed by patient: Performs perineal hygiene Toileting steps completed by helper: Adjust clothing prior to toileting, Adjust  clothing after toileting Toileting Assistive Devices: Grab bar or rail  Toileting assist Assist level: Touching or steadying assistance (Pt.75%)   Transfers Chair/bed transfer   Chair/bed transfer method: Stand pivot Chair/bed  transfer assist level: Moderate assist (Pt 50 - 74%/lift or lower) Chair/bed transfer assistive device: Armrests Mechanical lift: Stedy   Locomotion Ambulation Ambulation activity did not occur: Safety/medical concerns   Max distance: 5550ft Assist level: Moderate assist (Pt 50 - 74%)   Wheelchair   Type: Manual Max wheelchair distance: 1860ft  Assist Level: Supervision or verbal cues  Cognition Comprehension Comprehension assist level: Understands basic 75 - 89% of the time/ requires cueing 10 - 24% of the time  Expression Expression assist level: Expresses basic 75 - 89% of the time/requires cueing 10 - 24% of the time. Needs helper to occlude trach/needs to repeat words.  Social Interaction Social Interaction assist level: Interacts appropriately 75 - 89% of the time - Needs redirection for appropriate language or to initiate interaction.  Problem Solving Problem solving assist level: Solves basic 75 - 89% of the time/requires cueing 10 - 24% of the time  Memory Memory assist level: Recognizes or recalls 75 - 89% of the time/requires cueing 10 - 24% of the time     Medical Problem List and Plan: 1.  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  -cont CIR   -continue helmet when OOB  -Dr. Lionel DecemberFargen, [facility]---cranioplasty now scheduled for 2/15---working on facilitating logistics of procedure. Also have concerns about fluid in craniectomy site. Want to make sure they are willing to do surgery  -family ed continues 2.  DVT Prophylaxis/Anticoagulation: Mechanical: Sequential compression devices, below knee Bilateral lower extremities. LLE dopplers 12/26 negative for DVT 3. Headaches/Pain Management: improved  -extremity pain is neuropathic.    -maintain gabapentin at 600mg  TID          -continue hydrocodone at 10mg  prn  -overall appears improved 4. Mood: LCSW to follow for evaluation and support when appropriate.    -depression a factor, but showing signs of improvement  -zoloft at 100mg  daily    -adderall 20mg  XR for initiation/engagement (was also using at home for ADHD)   5. Neuropsych: This patient is not capable of making decisions on her own behalf. 6. Skin/Wound Care: remove remaining sutures.  7. Fluids/Electrolytes/Nutrition:  D3/thins per SLP  -eating well  -removed PEG without issue   -continue dry dressing  - all meds pill form with applesauce 8. VDRF:  trach stoma closed 9. New onset seizures: Continue Keppra bid. Changed to 500mg  tablet 10. Spasticity/Neuropathy LUE/LLE: see above  -baclofen  20 increased to QID with positive effects  -LUE/LLE ROM, splinting  11. Hypotension/bradycardia  WNL 2/10  LOS (Days) 45 A FACE TO FACE EVALUATION WAS PERFORMED  Avaline Stillson Karis JubaAnil Aadarsh Cozort, MD 07/04/2016 10:41 AM

## 2016-07-05 ENCOUNTER — Inpatient Hospital Stay (HOSPITAL_COMMUNITY): Payer: Managed Care, Other (non HMO)

## 2016-07-05 MED ORDER — POLYETHYLENE GLYCOL 3350 17 G PO PACK
17.0000 g | PACK | Freq: Every day | ORAL | Status: DC
  Administered 2016-07-05 – 2016-07-06 (×2): 17 g via ORAL
  Filled 2016-07-05: qty 1

## 2016-07-05 NOTE — Progress Notes (Signed)
[place] PHYSICAL MEDICINE & REHABILITATION     PROGRESS NOTE    Subjective/Complaints: Pt seen alying in bed this AM.  She slept well, but is still sleepy.   ROS Denies CP, SOB, N/V/D.  Objective: Vital Signs: Blood pressure (!) 97/57, pulse (!) 50, temperature 97.5 F (36.4 C), temperature source Oral, resp. rate 18, height 5\' 4"  (1.626 m), weight 81.1 kg (178 lb 14.4 oz), SpO2 97 %. No results found. No results for input(s): WBC, HGB, HCT, PLT in the last 72 hours. No results for input(s): NA, K, CL, GLUCOSE, BUN, CREATININE, CALCIUM in the last 72 hours.  Invalid input(s): CO CBG (last 3)  No results for input(s): GLUCAP in the last 72 hours.  Wt Readings from Last 3 Encounters:  06/17/16 81.1 kg (178 lb 14.4 oz)  05/20/16 84.4 kg (186 lb 1.6 oz)    Physical Exam:  Constitutional: She appears well-developed and well-nourished. NAD. HENT: crani site clean/dry/intact Eyes: EOMI. No discharge.   Neck: Trach stoma healed    Cardiovascular: RRR. No JVD. Respiratory: CTA bilaterally. Unlabored.  GI: Soft. Bowel sounds are normal.  Musculoskeletal: No edema. 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  Skin: Skin is warm and dry.  Psychiatric: pleasant and interactive  Assessment/Plan: 1. 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 Physiatrist is providing close team supervision and 24 hour management of active medical problems listed below. Physiatrist and rehab team continue to assess barriers to discharge/monitor patient progress toward functional and medical goals.  Function:  Bathing Bathing position   Position: Shower  Bathing parts Body parts bathed by patient: Left arm, Chest, Abdomen, Front perineal area, Left upper leg,  Right upper leg, Right arm, Left lower leg, Right lower leg Body parts bathed by helper: Buttocks, Back  Bathing assist Assist Level: Touching or steadying assistance(Pt > 75%)      Upper Body Dressing/Undressing Upper body dressing   What is the patient wearing?: Pull over shirt/dress     Pull over shirt/dress - Perfomed by patient: Thread/unthread right sleeve, Put head through opening, Pull shirt over trunk Pull over shirt/dress - Perfomed by helper: Thread/unthread left sleeve Button up shirt - Perfomed by patient: Thread/unthread right sleeve Button up shirt - Perfomed by helper: Thread/unthread left sleeve, Pull shirt around back, Button/unbutton shirt    Upper body assist Assist Level:  (Mod A)      Lower Body Dressing/Undressing Lower body dressing   What is the patient wearing?: Pants, Socks, Shoes     Pants- Performed by patient: Thread/unthread right pants leg Pants- Performed by helper: Thread/unthread left pants leg, Pull pants up/down   Non-skid slipper socks- Performed by helper: Don/doff right sock, Don/doff left sock Socks - Performed by patient: Don/doff right sock Socks - Performed by helper: Don/doff left sock Shoes - Performed by patient: Don/doff right shoe Shoes - Performed by helper: Don/doff right shoe, Don/doff left shoe, Fasten right, Fasten left   AFO - Performed by helper: Don/doff left AFO      Lower body assist Assist for lower body dressing:  (Max A)      Toileting Toileting Toileting activity did not occur: Refused Toileting steps completed by patient: Performs perineal hygiene Toileting steps completed by helper: Adjust clothing prior to toileting, Adjust clothing after toileting Toileting Assistive Devices: Grab bar  or rail  Toileting assist Assist level: Touching or steadying assistance (Pt.75%)   Transfers Chair/bed transfer   Chair/bed transfer method: Squat pivot Chair/bed transfer assist level: Moderate assist (Pt 50 - 74%/lift  or lower) Chair/bed transfer assistive device: Armrests Mechanical lift: Stedy   Locomotion Ambulation Ambulation activity did not occur: Safety/medical concerns   Max distance: 10 Assist level: Moderate assist (Pt 50 - 74%)   Wheelchair   Type: Manual Max wheelchair distance: 112ft  Assist Level: Supervision or verbal cues  Cognition Comprehension Comprehension assist level: Understands basic 90% of the time/cues < 10% of the time  Expression Expression assist level: Expresses basic 50 - 74% of the time/requires cueing 25 - 49% of the time. Needs to repeat parts of sentences.  Social Interaction Social Interaction assist level: Interacts appropriately 75 - 89% of the time - Needs redirection for appropriate language or to initiate interaction.  Problem Solving Problem solving assist level: Solves basic 75 - 89% of the time/requires cueing 10 - 24% of the time  Memory Memory assist level: Recognizes or recalls 75 - 89% of the time/requires cueing 10 - 24% of the time     Medical Problem List and Plan: 1.  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  -cont CIR   -continue helmet when OOB  -Dr. Lionel December, [facility]---cranioplasty now scheduled for 2/15---working on facilitating logistics of procedure. Also have concerns about fluid in craniectomy site. Want to make sure they are willing to do surgery  -family ed continues 2.  DVT Prophylaxis/Anticoagulation: Mechanical: Sequential compression devices, below knee Bilateral lower extremities. LLE dopplers 12/26 negative for DVT 3. Headaches/Pain Management: improved  -extremity pain is neuropathic.    -maintain gabapentin at 600mg  TID          -continue hydrocodone at 10mg  prn  -improved 4. Mood: LCSW to follow for evaluation and support when appropriate.   -depression a factor, but showing signs of improvement  -zoloft at 100mg  daily    -adderall 20mg  XR for  initiation/engagement (was also using at home for ADHD)   5. Neuropsych: This patient is not capable of making decisions on her own behalf. 6. Skin/Wound Care: remove remaining sutures.  7. Fluids/Electrolytes/Nutrition:  D3/thins per SLP  -eating well  -removed PEG without issue   -continue dry dressing  - all meds pill form with applesauce 8. VDRF:  trach stoma closed 9. New onset seizures: Continue Keppra bid. Changed to 500mg  tablet 10. Spasticity/Neuropathy LUE/LLE: see above  -baclofen  20 increased to QID with benefits  -LUE/LLE ROM, splinting  11. Hypotension/bradycardia  Asyptomatic   LOS (Days) 46 A FACE TO FACE EVALUATION WAS PERFORMED  Ankit Karis Juba, MD 07/05/2016 3:24 PM

## 2016-07-05 NOTE — Progress Notes (Signed)
Patient and mother reports that, patient with increased complaints of "tooth" pain and headaches, over the past few days. Mom feels [facility] has worn off!? Also complaining of cold sensitivity. PRN Hydrocodone given at 2006, with relief. Intermittently wearing, per Patient's request, LUE splint and Left PRAFO. Dressing to old PEG site CD&I. LBM > 4 days ago, refusing extra LOC. Up to bathroom with stedy to void. Cheryl Gibbs, Cheryl Gibbs

## 2016-07-05 NOTE — Plan of Care (Signed)
Problem: RH PAIN MANAGEMENT Goal: RH STG PAIN MANAGED AT OR BELOW PT'S PAIN GOAL Less than 3 out of 10   Outcome: Not Progressing Consistently rating pain a 6.

## 2016-07-05 NOTE — Progress Notes (Signed)
No BM since 2/6-11/2016.  Miralax given daily and prn as ordered without results.  Offered dulcolax suppository and enema.  Both were refused several times.  Explained the need for regular bowel evacuation.  Appetite good, no c/o nausea, etc.  Bowel sounds hypoactive.

## 2016-07-05 NOTE — Progress Notes (Signed)
Physical Therapy Note  Patient Details  Name: Cheryl Gibbs MRN: [id number] Date of Birth: 04/05/1991 Today's Date: 07/05/2016  1300-1345, 45 min individual tx Pain: 6/10, back of L knee, better with sitting  PT donned bil shoes and L air cast, helmet.  Vcs and rail for supine> sit with min assist. Reciprocal scooting on bed,  for L attention.  Squat pivot to L bed> w/c with mod assist with L inattention, assistance to avoid sitting on wheel.  Pt easily frustrated and tearful due to poor motor return.  PT and visiting family friend provided emotional support.  Neuromuscular re-education via multimodal cues for L quad/hamstring activation in standing, and LLE loading via  R foot stepping forward/backward with R hand support on footboard of bed, focusing on limiting L knee hyperextension.  Seated R long arc quad knee ext and R hip flexion x 1 x 10 each.  Gait in room on level tile x 10' with HW with mod assist for progression of LLE.  Pt c/o L knee pain after gait.  Pt left resting in w/c with quick release belt donned and all needs within reach.  Family friend visiting pt.  Taahir Grisby 07/05/2016, 12:52 PM

## 2016-07-05 NOTE — Plan of Care (Signed)
Problem: RH BOWEL ELIMINATION Goal: RH STG MANAGE BOWEL WITH ASSISTANCE STG Manage Bowel with min Assistance.    Outcome: Not Progressing No BM > 4 days

## 2016-07-05 NOTE — Plan of Care (Signed)
Problem: RH BOWEL ELIMINATION Goal: RH STG MANAGE BOWEL W/MEDICATION W/ASSISTANCE STG Manage Bowel with Medication with max Assistance.   Outcome: Not Progressing No BM > 4 days

## 2016-07-06 ENCOUNTER — Inpatient Hospital Stay (HOSPITAL_COMMUNITY): Payer: Managed Care, Other (non HMO) | Admitting: Speech Pathology

## 2016-07-06 ENCOUNTER — Inpatient Hospital Stay (HOSPITAL_COMMUNITY): Admitting: Occupational Therapy

## 2016-07-06 ENCOUNTER — Inpatient Hospital Stay (HOSPITAL_COMMUNITY): Payer: Managed Care, Other (non HMO) | Admitting: Physical Therapy

## 2016-07-06 MED ORDER — SORBITOL 70 % SOLN
45.0000 mL | Freq: Two times a day (BID) | Status: DC | PRN
  Administered 2016-07-06: 45 mL via ORAL
  Filled 2016-07-06: qty 60

## 2016-07-06 MED ORDER — SORBITOL 70 % SOLN: 45.0000 mL | Freq: Every day | Status: DC | PRN

## 2016-07-06 MED ORDER — SENNA 8.6 MG PO TABS
2.0000 | ORAL_TABLET | Freq: Every day | ORAL | Status: DC
  Administered 2016-07-07 – 2016-07-08 (×2): 17.2 mg via ORAL
  Filled 2016-07-06 (×2): qty 2

## 2016-07-06 NOTE — Progress Notes (Signed)
Occupational Therapy Session Note  Patient Details  Name: Cheryl Gibbs MRN: [id number] Date of Birth: 10/08/1990  Today's Date: 07/06/2016 OT Individual Time: [id number] OT Individual Time Calculation (min): 60 min    Short Term Goals: Week 6:  OT Short Term Goal 1 (Week 6): STGs = LTGs  Skilled Therapeutic Interventions/Progress Updates:    Treatment session with focus on ADL retraining with functional transfers, Lt attention, sit > stand, and standing balance during bathing and dressing tasks.  Pt received in bed upon arrival, finishing breakfast.  Completed bed mobility with min-mod assist for initiation and weight shifting.  Pt reports use of Stedy for bathroom transfers, reiterated most recent treatment session with squat pivot transfers in/out of shower.  Pt willing to complete transfers without Stedy this session.  Mod assist squat pivot transfer to Lt out of bed with manual facilitation for weight shift and attention to Lt.  Bathing completed with assist to wash buttocks in standing, with focus on pt maintaining static standing balance.  Assist to position LUE to allow pt to wash Lt trunk and underarm.  Dressing completed with min-mod multimodal cues for sequencing as pt initially attempting to thread Lt arm through head hole.  Completed LB dressing in manner above with pt maintaining static standing balance while therapist pulled pants over hips.  Left seated upright in w/c with quick release belt intact and all needs in reach.  Therapy Documentation Precautions:  Precautions Precautions: Fall Precaution Comments: helmet when OOB Restrictions Weight Bearing Restrictions: No General:   Vital Signs: Therapy Vitals Temp: 98.2 F (36.8 C) Temp Source: Oral Pulse Rate: 76 Resp: 18 BP: (!) 96/57 Patient Position (if appropriate): Lying Oxygen Therapy SpO2: 96 % O2 Device: Not Delivered Pain:  Pt reports pain in Lt leg, unrated.  Repositioned and applied air cast for increased  support.  See Function Navigator for Current Functional Status.   Therapy/Group: Individual Therapy  Rosalio LoudHOXIE, [name] 07/06/2016, 8:56 AM

## 2016-07-06 NOTE — Progress Notes (Signed)
Speech Language Pathology Daily Session Note  Patient Details  Name: Cheryl Gibbs MRN: [id number] Date of Birth: 07/02/1990  Today's Date: 07/06/2016 SLP Individual Time: 1220-1320 SLP Individual Time Calculation (min): 60 min  Short Term Goals: Week 7: SLP Short Term Goal 1 (Week 7): Patient will demonstrate functional problem solving with functional tasks with supervision verbal cues.  SLP Short Term Goal 2 (Week 7): Patient will recall new, daily information with supervision verbal cues.  SLP Short Term Goal 3 (Week 7): Patient will consume current diet without overt s/s of aspiration and Mod I for use of swallowing compensatory strategies.  SLP Short Term Goal 4 (Week 7): Patient will demonstrate efficient mastication and complete oral clearance with trials of regular textures over 2 sessions prior to upgrade.  SLP Short Term Goal 5 (Week 7): Patient will verbalize at the sentence level with extra time and Min A verbal cues to self-monitor and correct errors.   Skilled Therapeutic Interventions: Skilled treatment session focused on dysphagia and speech goals. SLP facilitated session by providing skilled observation with lunch meal of regular textures with thin liquids. Patient consumed meal and demonstrated efficient mastication with minimal oral residue without overt s/s of aspiration and was Mod I for use of swallowing compensatory strategies. Therefore, recommend patient upgrade to regular textures. Patient also required Min-Mod A verbal and visual cues to self-monitor and correct verbal and written errors at the sentence level in regards to adding prepositions and articles to make them complete sentences in order to reduce telegraphic speech. Patient left upright in wheelchair with all needs within reach. Continue with current plan of care.      Function:  Eating Eating   Modified Consistency Diet: No Eating Assist Level: Swallowing techniques: self managed;Set up assist for   Eating  Set Up Assist For: Opening containers;Cutting food       Cognition Comprehension Comprehension assist level: Understands basic 90% of the time/cues < 10% of the time  Expression   Expression assist level: Expresses basic 75 - 89% of the time/requires cueing 10 - 24% of the time. Needs helper to occlude trach/needs to repeat words.  Social Interaction Social Interaction assist level: Interacts appropriately with others - No medications needed.  Problem Solving Problem solving assist level: Solves basic 75 - 89% of the time/requires cueing 10 - 24% of the time  Memory Memory assist level: Recognizes or recalls 75 - 89% of the time/requires cueing 10 - 24% of the time    Pain No/Denies Pain   Therapy/Group: Individual Therapy  Cheryl Gibbs 07/06/2016, 1:25 PM

## 2016-07-06 NOTE — Progress Notes (Signed)
Occupational Therapy Session Note  Patient Details  Name: Cheryl ClinesLaura Zaragosa MRN: [id number] Date of Birth: 05/22/1991  Today's Date: 07/06/2016 OT Individual Time: [id number] OT Individual Time Calculation (min): 29 min    Skilled Therapeutic Interventions/Progress Updates: Pt was sitting in w/c with helmet at time of arrival, agreeable to session. Pt adamantly refused to practice squat pivot transfers due to completing them frequently during therapies. She was agreeable to practice simulated clothing mgt during sit<stands at sink for ADL retraining and work on L UE NMR. Sit<stand completed at sink with Mod A with visualbiofeedback utilized for increasing awareness regarding midline orientation and postural control. Pt able to correct left lean during static stand with left knee blocked. When pt practiced lifting theraband over hips, left pushing increased with pt cued to correct balance and then continue with simulated dressing. After 3 trials, pt was able to lift theraband over hips completely (Min-Mod A for balance). Pt then completed active assisted L UE NMR towel glide activity while seated including shoulder protraction/retraction and IR/ER x10 reps. Pt became tearful x3 instances during session due to frustration regarding functional deficits. Pt provided support and encouragement to increase motivation and self efficacy. At end of session she was left in w/c with half lap tray, hand splint, and safety belt donned. All needs were within reach at time of departure.      Therapy Documentation Precautions:  Precautions Precautions: Fall Precaution Comments: helmet when OOB Restrictions Weight Bearing Restrictions: No   Pain: No c/o or expressions of pain during session  Pain Assessment Pain Assessment: 0-10 Pain Score: 4  Pain Type: Acute pain Pain Location: Buttocks ADL: ADL ADL Comments: refer to navigator:    See Function Navigator for Current Functional Status.   Therapy/Group:  Individual Therapy  Montine Hight A Korine Winton 07/06/2016, 12:33 PM

## 2016-07-06 NOTE — Plan of Care (Signed)
Problem: RH BOWEL ELIMINATION Goal: RH STG MANAGE BOWEL WITH ASSISTANCE STG Manage Bowel with min Assistance.    Outcome: Not Progressing No BM in 6 days

## 2016-07-06 NOTE — Progress Notes (Signed)
[facility] PHYSICAL MEDICINE & REHABILITATION     PROGRESS NOTE    Subjective/Complaints: Up working OT, washing up   ROS No cp,sob, nvd  Objective: Vital Signs: Blood pressure (!) 96/57, pulse 76, temperature 98.2 F (36.8 C), temperature source Oral, resp. rate 18, height 5\' 4"  (1.626 m), weight 81.1 kg (178 lb 14.4 oz), SpO2 96 %. No results found. No results for input(s): WBC, HGB, HCT, PLT in the last 72 hours. No results for input(s): NA, K, CL, GLUCOSE, BUN, CREATININE, CALCIUM in the last 72 hours.  Invalid input(s): CO CBG (last 3)  No results for input(s): GLUCAP in the last 72 hours.  Wt Readings from Last 3 Encounters:  06/17/16 81.1 kg (178 lb 14.4 oz)  05/20/16 84.4 kg (186 lb 1.6 oz)    Physical Exam:  Constitutional: She appears well-developed and well-nourished. NAD. HENT: crani site clean/dry/intact Eyes: EOMI. No discharge.   Neck: Trach stoma healed    Cardiovascular: RRR without JVD. Respiratory: CTA bilaterally. Unlabored.  GI: Soft. Bowel sounds are normal.  Musculoskeletal: No edema. Left shoulder tenderness Neurological: Improved vocal output as a whole. Initiates a lot more Spastic left hemiparesis 2/4 wrist, finger flexors (stable) Moves RUE/RLE grossly 5/5.  Skin: Skin is warm and dry.  Psychiatric: pleasant and interactive  Assessment/Plan: 1. 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 Physiatrist is providing close team supervision and 24 hour management of active medical problems listed below. Physiatrist and rehab team continue to assess barriers to discharge/monitor patient progress toward functional and medical goals.  Function:  Bathing Bathing position   Position: Shower  Bathing parts Body parts bathed by patient: Left arm, Chest, Abdomen, Front perineal area, Left upper leg, Right upper leg, Right  arm, Left lower leg, Right lower leg, Back Body parts bathed by helper: Buttocks  Bathing assist Assist Level: Touching or steadying assistance(Pt > 75%)      Upper Body Dressing/Undressing Upper body dressing   What is the patient wearing?: Pull over shirt/dress     Pull over shirt/dress - Perfomed by patient: Thread/unthread right sleeve, Put head through opening Pull over shirt/dress - Perfomed by helper: Thread/unthread left sleeve, Pull shirt over trunk Button up shirt - Perfomed by patient: Thread/unthread right sleeve Button up shirt - Perfomed by helper: Thread/unthread left sleeve, Pull shirt around back, Button/unbutton shirt    Upper body assist Assist Level:  (Mod assist)      Lower Body Dressing/Undressing Lower body dressing   What is the patient wearing?: Pants, Socks     Pants- Performed by patient: Thread/unthread right pants leg Pants- Performed by helper: Thread/unthread left pants leg, Pull pants up/down   Non-skid slipper socks- Performed by helper: Don/doff right sock, Don/doff left sock Socks - Performed by patient: Don/doff right sock Socks - Performed by helper: Don/doff left sock Shoes - Performed by patient: Don/doff right shoe Shoes - Performed by helper: Don/doff right shoe, Don/doff left shoe, Fasten right, Fasten left   AFO - Performed by helper: Don/doff left AFO      Lower body assist Assist for lower body dressing:  (max assist)      Toileting Toileting Toileting activity did not occur: Refused Toileting steps completed by patient: Performs perineal hygiene Toileting steps completed by helper: Adjust clothing prior to toileting, Adjust clothing after toileting Toileting Assistive Devices: Grab bar or rail  Toileting assist Assist  level: Touching or steadying assistance (Pt.75%)   Transfers Chair/bed transfer   Chair/bed transfer method: Squat pivot Chair/bed transfer assist level: Moderate assist (Pt 50 - 74%/lift or lower) Chair/bed  transfer assistive device: Armrests Mechanical lift: Stedy   Locomotion Ambulation Ambulation activity did not occur: Safety/medical concerns   Max distance: 10 Assist level: Moderate assist (Pt 50 - 74%)   Wheelchair   Type: Manual Max wheelchair distance: [id number]  Assist Level: Supervision or verbal cues  Cognition Comprehension Comprehension assist level: Understands basic 75 - 89% of the time/ requires cueing 10 - 24% of the time  Expression Expression assist level: Expresses basic 50 - 74% of the time/requires cueing 25 - 49% of the time. Needs to repeat parts of sentences.  Social Interaction Social Interaction assist level: Interacts appropriately 75 - 89% of the time - Needs redirection for appropriate language or to initiate interaction.  Problem Solving Problem solving assist level: Solves basic 75 - 89% of the time/requires cueing 10 - 24% of the time  Memory Memory assist level: Recognizes or recalls 75 - 89% of the time/requires cueing 10 - 24% of the time     Medical Problem List and Plan: 1.  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  -cont CIR   -continue helmet when OOB  -Dr. Lionel DecemberFargen, [facility]---cranioplasty now scheduled for 2/15---still haven't confirmed logistics of procedure.   -spoke to Dr. Lionel DecemberFargen Friday who said will deal with increased fluid at time of cranioplasty. Could perhaps require a shunt.   -family ed continues 2.  DVT Prophylaxis/Anticoagulation: Mechanical: Sequential compression devices, below knee Bilateral lower extremities. LLE dopplers 12/26 negative for DVT 3. Headaches/Pain Management: improved  -extremity pain is neuropathic.    -maintain gabapentin at 600mg  TID          -continue hydrocodone at 10mg  prn  -improved 4. Mood: LCSW to follow for evaluation and support when appropriate.   -depression a factor, but showing signs of improvement  -zoloft at 100mg  daily     -adderall 20mg  XR for initiation/engagement (was also using at home for ADHD)   5. Neuropsych: This patient is not capable of making decisions on her own behalf. 6. Skin/Wound Care: remove remaining sutures.  7. Fluids/Electrolytes/Nutrition:  D3/thins per SLP  -eating well  -removed PEG without issue   -stoma closed  - all meds pill form with applesauce 8. VDRF:  trach stoma closed 9. New onset seizures: Continue Keppra bid. Changed to 500mg  tablet 10. Spasticity/Neuropathy LUE/LLE: see above  -baclofen  20 increased to QID with benefits  -LUE/LLE ROM, splinting  11. Hypotension/bradycardia  Asyptomatic   LOS (Days) 47 A FACE TO FACE EVALUATION WAS PERFORMED  Ranelle OysterSWARTZ,ZACHARY T, MD 07/06/2016 9:26 AM

## 2016-07-06 NOTE — Progress Notes (Signed)
Physical Therapy Session Note  Patient Details  Name: Cheryl Gibbs MRN: [id number] Date of Birth: 03/23/1991  Today's Date: 07/06/2016 PT Individual Time: 0901-1001 PT Individual Time Calculation (min): 60 min   Skilled Therapeutic Interventions/Progress Updates:    Pt received in w/c & agreeable to tx. Pt noted 6/10 pain in LUE & LLE & RN made aware. Pt propelled w/c room>ortho gym with R hemi technique and supervision in controlled environment; pt requires extra time to propel w/c. Pt completed car transfer (car set at 30" to simulate mom's car) via stand pivot with mod assist. Pt requires cuing for sequencing and technique for transfer as pt is impulsive. Therapist also provides total assist for w/c set up and transferring LLE in/out of car. Pt completed furniture transfer from low, soft couch via squat pivot with mod assist; therapist provides assistance for controlled lowering onto couch. Pt utilize nu-step on level 1 x 7 minutes with BLE only and therapist providing manual facilitation to prevent LLE abduction; activity focused on LLE neuro re-ed and endurance training. When pt completed squat pivot nu-step>w/c pt with poor safety awareness and landed on armrests, requiring physical assist to correct. Reinforced need for safety and to work with therapist during transfer instead of being impulsive. RN made aware that pt initially sat on w/c armrest. At end of session pt left sitting in w/c in room with QRB donned & RN present.  Therapy Documentation Precautions:  Precautions Precautions: Fall Precaution Comments: helmet when OOB Restrictions Weight Bearing Restrictions: No   See Function Navigator for Current Functional Status.   Therapy/Group: Individual Therapy  Sandi MariscalVictoria M Miller 07/06/2016, 10:21 AM

## 2016-07-06 NOTE — Plan of Care (Signed)
Problem: RH BOWEL ELIMINATION Goal: RH STG MANAGE BOWEL W/MEDICATION W/ASSISTANCE STG Manage Bowel with Medication with max Assistance.   Outcome: Not Progressing No BM in 6 days

## 2016-07-07 ENCOUNTER — Inpatient Hospital Stay (HOSPITAL_COMMUNITY): Admitting: Speech Pathology

## 2016-07-07 ENCOUNTER — Inpatient Hospital Stay (HOSPITAL_COMMUNITY): Payer: Managed Care, Other (non HMO) | Admitting: Occupational Therapy

## 2016-07-07 ENCOUNTER — Inpatient Hospital Stay (HOSPITAL_COMMUNITY): Payer: Managed Care, Other (non HMO) | Admitting: Speech Pathology

## 2016-07-07 ENCOUNTER — Inpatient Hospital Stay (HOSPITAL_COMMUNITY): Payer: Managed Care, Other (non HMO) | Admitting: Physical Therapy

## 2016-07-07 MED ORDER — NICOTINE 7 MG/24HR TD PT24
7.0000 mg | MEDICATED_PATCH | Freq: Every day | TRANSDERMAL | Status: DC
  Administered 2016-07-07 – 2016-07-08 (×2): 7 mg via TRANSDERMAL
  Filled 2016-07-07 (×2): qty 1

## 2016-07-07 NOTE — Progress Notes (Signed)
Nutrition Follow-up  DOCUMENTATION CODES:   Obesity unspecified  INTERVENTION:  Encourage adequate PO intake.   RD to continue to monitor for needs.   NUTRITION DIAGNOSIS:   Inadequate oral intake related to inability to eat as evidenced by NPO status; diet advanced; po 100%; improved  GOAL:   Patient will meet greater than or equal to 90% of their needs; met  MONITOR:   PO intake, Supplement acceptance, Diet advancement, Labs, Weight trends, Skin, I & O's  REASON FOR ASSESSMENT:   Consult Enteral/tube feeding initiation and management  ASSESSMENT:   26 year old female who was 10 days post partum and  was transferred to [facility] from OSH on 04/11/16  after being found unresponsive by husband.  Patient in bathtub--not submerged and activity seizing. 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  She underwent cerebral angiogram and decline neurologically shortly after admission to ICU. She was taken to OR emergently for right craniotomy for evacuation of IPH and obliteration of ruptured lenticulostriate aneurysm and placement of EVD. Trach and PEG were placed on 11/22.  She was admitted to [facility] on 05/01/16. She is non verbal.  Pt is currently on a regular diet with thin liquids. Meal completion has been 100% since diet advancement. Intake has been adequate. Pt encouraged to eat her foods at meals. RD to continue to monitor for needs.  Diet Order:  Diet regular Room service appropriate? Yes; Fluid consistency: Thin  Skin:  Reviewed, no issues  Last BM:  2/12  Height:   Ht Readings from Last 1 Encounters:  05/20/16 '5\' 4"'  (1.626 m)    Weight:   Wt Readings from Last 1 Encounters:  06/17/16 178 lb 14.4 oz (81.1 kg)    Ideal Body Weight:  54.5 kg  BMI:  Body  mass index is 30.71 kg/m.  Estimated Nutritional Needs:   Kcal:  1850-2050  Protein:  90-105 grams  Fluid:  1.8 - 2 L/day  EDUCATION NEEDS:   No education needs identified at this time  Corrin Parker, MS, RD, LDN Pager # [phone] After hours/ weekend pager # [phone]

## 2016-07-07 NOTE — Discharge Summary (Signed)
Physician Discharge Summary  Patient ID: Cheryl Gibbs MRN: [id number] DOB/AGE: 26/05/1990 26 y.o.  Admit date: 05/20/2016 Discharge date: 07/09/2016  Discharge Diagnoses:  Principal Problem:   Hemorrhage, intracranial (HCC) Active Problems:   Global aphasia   Spastic hemiparesis affecting nondominant side (HCC)   Vascular headache   Dysphagia, post-stroke   Seizure (HCC)   Neuropathy (HCC)   Hypotension   Bradycardia   Adjustment disorder with depressed mood   Posttraumatic stress disorder   Discharged Condition: stable.   Significant Diagnostic Studies: N/A          Labs:  Basic Metabolic Panel: BMP Latest Ref Rng & Units 07/01/2016 06/23/2016 06/17/2016  Glucose 65 - 99 mg/dL 87 89 841(Y101(H)  BUN 6 - 20 mg/dL 8 6 7   Creatinine 0.44 - 1.00 mg/dL 6.060.65 3.010.63 6.010.69  Sodium 135 - 145 mmol/L 143 141 143  Potassium 3.5 - 5.1 mmol/L 4.1 3.8 3.7  Chloride 101 - 111 mmol/L 106 106 104  CO2 22 - 32 mmol/L 25 26 27   Calcium 8.9 - 10.3 mg/dL 09.310.0 9.4 9.4    CBC: CBC Latest Ref Rng & Units 07/01/2016 06/23/2016 06/09/2016  WBC 4.0 - 10.5 K/uL 6.4 7.3 13.9(H)  Hemoglobin 12.0 - 15.0 g/dL 23.515.0 57.313.2 22.014.4  Hematocrit 36.0 - 46.0 % 47.5(H) 41.4 44.7  Platelets 150 - 400 K/uL 299 284 338    CBG: No results for input(s): GLUCAP in the last 168 hours.  Brief HPI:    Cheryl Gibbs is a 26 year old female who was 10 days post partum and  was [facility] on 04/11/16  after being found unresponsive in bathtub--not submerged and activity seizing. She was intubated for airway protection and CT head done revealing large acute IPH in right frontal lobe with vasogenic edema and 1.4 cm right to left mid-line shift, extensive IVH with suggestion of developing hydrocephalus.   She was taken to OR emergently for right craniotomy for evacuation of IPH and obliteration of ruptured lenticulostriate aneurysm and placement of EVD. Hospital course significant for VDRF with sepsis, diffuse vasospasms and  difficulty with vent wean requiring trach and PEG on 11/22.  [facility] and was extubated to [facility].  She was tolerating plugging but remained NPO with tube feeds for supplement. She continued to have right frontal HA as well as severe pain LUE/LLE--LLE dopplers negative for DVT on 12/26.  Follow up CT head done due to increase bulging through crani defect and revealed increase in extra axial fluid but was improving neurologically. Patient with resultant  expressive > receptive deficits, non verbal but able to communicate by writing, and had left sided weaknesses affecting mobility and self care tasks. CIR was recommended for follow up therapy.     Hospital Course: Cheryl Gibbs was admitted to rehab 05/20/2016 for inpatient therapies to consist of PT, ST and OT at least three hours five days a week. Past admission physiatrist, therapy team and rehab RN have worked together to provide customized collaborative inpatient rehab.  Right crani incision has healed well without signs or symptoms of infection. Respiratory status has been stable and she tolerated decannulation of trach on 12/29. E coli UTI was treated with one week course of cipro. She was advanced to bolus tube feeds and tolerated this without difficulty. She continued to be limited by spastic left hemiparesis with dysesthesias, discomfort with movement as well as ongoing headaches. Gabapentin was added to help manage headaches as well  as dysesthesias. This has slowly been titrated upwards with good results. Keppra tapered to 500 mg bid with increase in gabapentin dose. Baclofen was added to help manage spasticity and has helped with tolerance of PROM and splinting LLE to help manage symptoms.    She has has bouts of lability with depressed mood affecting participation. Zoloft was added and has been titrated upward to help with mood stabilization. This further increased to 150 mg at discharge due to increase in anxiety and reports of  depression about her progress at discharge.  She continued to have poor attention with perseverative behaviors and , problems with initiation therefore Adderall was added (family reported using at home for ADHD).  She continues to have persistent bulging around crani site and was evaluated by Dr. Lionel December at [facility] and set for cranioplasty on 2/15.  Po intake has improved with advancement of diet and PEG tube was removed without difficulty.  She has been educated on importance of tobacco and alcohol abstinence after discharge. She has made good progress and is currently at mod to max assist at wheelchair level. She will continue to receive follow up HHPT,HHOT, HHST and HHRN by [facility] after discharge. She was discharged to [facility] for surgery on 07/09/16.    Rehab course: During patient's stay in rehab weekly team conferences were held to monitor patient's progress, set goals and discuss barriers to discharge. At admission, patient required total assist with ADL tasks and + 2 mod to max assist for mobility. She was able to express needs by utilizing dry erase board and did not attempt to communicate verbally despite max cues. She demonstrated hoarse voice quality and behavior as well as cognition and motor planning felt to be affecting her verbal output. She was able to follow 3 step commands and answer Y/N questions at 100% accuracy. She required max verbal and visual cues to scan/attend to left visual field.  She has had improvement in activity tolerance, balance, postural control, as well as ability to compensate for deficits. She is has had improvement in functional use LUE  and LLE as well as improved awareness. She is able to perform transfers with min assist and is ambulating 53' with mod assist, manual facilitation of LLE and use of hemi walker. She is able to complete ADL tasks with min to mod assist. She requires supervision to min assist to complete functional and familiar tasks, for recall of new  information, emergent awareness and problem solving. She is able to verbalize at phrase level and requires min assist to self monitor and correct verbal errors. Family education was completed with husband and mother regarding all aspects of care.    Disposition: [facility] for surgery  Diet: Regular  Special Instructions: 1. Limit walking with therapy only.  2. Double check current medication list from [facility] prior to discharge from hospital.    Discharge Instructions    Ambulatory referral to Physical Medicine Rehab    Complete by:  As directed    1-2 weeks transitional care     Allergies as of 07/09/2016      Reactions   Latex Other (See Comments)   Burning (internal)      Medication List    STOP taking these medications   ciprofloxacin 250 MG tablet Commonly known as:  CIPRO   ferrous sulfate 75 (15 Fe) MG/ML Soln Commonly known as:  FER-IN-SOL   folic acid 1 MG tablet Commonly known as:  FOLVITE   gabapentin 250 MG/5ML solution  Commonly known as:  NEURONTIN Replaced by:  gabapentin 600 MG tablet   levETIRAcetam 100 MG/ML solution Commonly known as:  KEPPRA Replaced by:  levETIRAcetam 500 MG tablet   QUEtiapine 25 MG tablet Commonly known as:  SEROQUEL   sertraline 20 MG/ML concentrated solution Commonly known as:  ZOLOFT Replaced by:  sertraline 100 MG tablet   topiramate 25 MG tablet Commonly known as:  TOPAMAX     TAKE these medications   amphetamine-dextroamphetamine 20 MG 24 hr capsule--Rx # 30 pills  Commonly known as:  ADDERALL XR Take 1 capsule (20 mg total) by mouth daily. Start taking on:  07/10/2016   atorvastatin 20 MG tablet Commonly known as:  LIPITOR Take 1 tablet (20 mg total) by mouth daily at 6 PM.   baclofen 20 MG tablet Commonly known as:  LIORESAL Take 1 tablet (20 mg total) by mouth 3 (three) times daily.   famotidine 10 MG tablet Commonly known as:  PEPCID Take 1 tablet (10 mg total) by mouth daily. Start taking on:  07/10/2016    gabapentin 600 MG tablet Commonly known as:  NEURONTIN Take 1 tablet (600 mg total) by mouth 3 (three) times daily. Replaces:  gabapentin 250 MG/5ML solution   HYDROcodone-acetaminophen 10-325 MG tablet--Rx # 30 pills  Commonly known as:  NORCO Take 1 tablet by mouth every 12 (twelve) hours as needed for severe pain.   levETIRAcetam 500 MG tablet Commonly known as:  KEPPRA Take 1 tablet (500 mg total) by mouth 2 (two) times daily. Replaces:  levETIRAcetam 100 MG/ML solution   multivitamin Liqd Take 15 mLs by mouth daily.   naphazoline-glycerin 0.012-0.2 % Soln Commonly known as:  CLEAR EYES Place 1-2 drops into the left eye 4 (four) times daily -  with meals and at bedtime.   polyethylene glycol packet Commonly known as:  MIRALAX / GLYCOLAX Take 17 g by mouth daily as needed. What changed:  when to take this  reasons to take this   senna 8.6 MG Tabs tablet Commonly known as:  SENOKOT Take 2 tablets (17.2 mg total) by mouth at bedtime.   sertraline 100 MG tablet Commonly known as:  ZOLOFT Take 1.5 tablets (150 mg total) by mouth daily. Start taking on:  07/10/2016 Replaces:  sertraline 20 MG/ML concentrated solution   traZODone 50 MG tablet Commonly known as:  DESYREL Take 0.5-1 tablets (25-50 mg total) by mouth at bedtime as needed for sleep.      Follow-up Information    Ranelle Oyster, MD Follow up.   Specialty:  Physical Medicine and Rehabilitation Why:  office will call with follow up appointment Contact information: [address] [place] [address] [phone]        Luisa Dago, MD Follow up.   Specialty:  Neurosurgery Why:  Carma Leaven-- phone # [phone] Contact information: [address] [place] [address] [phone]        Alease Medina, MD Follow up.   Specialty:  Student Why:  07/23/16 @ 10:00  UPDATED ADDRESS AND PHONE FOR DR. FULK: [address] [place]   [address] [phone]  Contact information: [address] [place] [address] [phone]           Signed: Jacquelynn Cree 07/09/2016, 10:16 AM

## 2016-07-07 NOTE — Progress Notes (Signed)
Patient reporting anxiety about discharge as well as concerns about responsibilities after dicharge. Wants "a cigarette bad"--discussed anxiolytic but she does not want another pill. She is willing to start nicotine patch but is not ready to quit yet.  Both mother and husband smoke.  Persisted on knowing when she would be cleared. Discussed its effect on healing--she plans on talking to the surgeon.

## 2016-07-07 NOTE — Progress Notes (Signed)
Physical Therapy Session Note  Patient Details  Name: Cheryl ClinesLaura Chalmers MRN: [id number] Date of Birth: 03/17/1991  Today's Date: 07/07/2016 PT Individual Time: 0902-1002 PT Individual Time Calculation (min): 60 min   Skilled Therapeutic Interventions/Progress Updates:    Pt received in bed & agreeable to tx, denying c/o pain at rest. Therapist donned L aircast & B shoes total assist, requiring assistance to put L ankle in neutral. Pt transferred supine>sitting EOB with HOB elevated, bed rails, and min assist. Pt completed squat pivot transfer bed>w/c with mod assist for sequencing and weight shifting. Pt propelled w/c room>gym with R hemi technique and supervision with cuing x 1 time for L attention and overall extra time for task. Pt negotiated 12 steps (6" + 3") with R rail and +2 assist (max assist with second person present for safety). Pt requires max assist for weight shifting, advancement & placement of LLE and cuing for technique. Therapist also provides blocking at L knee to prevent buckling and hyperextension. Therapist and PA Elita Quick(Pam) educated pt on health risks of smoking and encouraged pt to quit smoking. At end of session pt left sitting in w/c in room with QRB donned & all needs within reach.   Pt reports her mother plans to work and her grandfather and aunt will assist her during day; pt also states these family members do not plan to come in for family training, her mom will teach them instead.   Therapy Documentation Precautions:  Precautions Precautions: Fall Precaution Comments: helmet when OOB Restrictions Weight Bearing Restrictions: No   See Function Navigator for Current Functional Status.   Therapy/Group: Individual Therapy  Sandi MariscalVictoria M Mizani Dilday 07/07/2016, 10:27 AM

## 2016-07-07 NOTE — Progress Notes (Signed)
Occupational Therapy Session Note  Patient Details  Name: Cheryl ClinesLaura Gibbs MRN: [id number] Date of Birth: 07/12/1990  Today's Date: 07/07/2016 OT Individual Time: 1100-1200 OT Individual Time Calculation (min): 60 min    Short Term Goals: Week 6:  OT Short Term Goal 1 (Week 6): STGs = LTGs  Skilled Therapeutic Interventions/Progress Updates:    Pt seen for OT ADL bathing/dressing session. Pt sitting up in w/c upon arrival, in short verses stating she was agreeable to tx session and desiring to shower.  Completed min A squat pivot transfer into shower with VCs for hand placement on grab bar. She required max A to exit shower as upon attempt at squat pivot pt's L LE out in front and foot inverted resulting in pt's inability to complete transfer safely, increased assist to reposition L LE and slide safely back into w/c. She bathed seated on tub transfer bench, VCs for problem solving opening of containers. She stood with min A to stand and mod steadying A while buttock hygiene completed total A. She dressed seated in w/c, quick to ask for assist, however, when encouraged and with increased time able to complete more tasks independently. She required assist for threading L limbs into clothing, educated regarding hemi dressing technique. She stood at sink with mod-max static standing balance to have pants pulled up. Pt left seated in w/c at end of session, QRB donned, and half lap tray on.   Therapy Documentation Precautions:  Precautions Precautions: Fall Precaution Comments: helmet when OOB Restrictions Weight Bearing Restrictions: No Pain:   Pt asking for pain meds at beginning of session, however, not due for any at this time. Pt provided shower and repositioned for comfort.  ADL: ADL ADL Comments: refer to navigator  See Function Navigator for Current Functional Status.   Therapy/Group: Individual Therapy  Lewis, Euna Armon C 07/07/2016, 6:54 AM

## 2016-07-07 NOTE — Progress Notes (Signed)
[facility] PHYSICAL MEDICINE & REHABILITATION     PROGRESS NOTE    Subjective/Complaints: Sitting in bed no new complaints.  ROS No nvd, sob, cp, cough  Objective: Vital Signs: Blood pressure (!) 103/57, pulse 63, temperature 98 F (36.7 C), temperature source Oral, resp. rate 18, height 5\' 4"  (1.626 m), weight 81.1 kg (178 lb 14.4 oz), SpO2 97 %. No results found. No results for input(s): WBC, HGB, HCT, PLT in the last 72 hours. No results for input(s): NA, K, CL, GLUCOSE, BUN, CREATININE, CALCIUM in the last 72 hours.  Invalid input(s): CO CBG (last 3)  No results for input(s): GLUCAP in the last 72 hours.  Wt Readings from Last 3 Encounters:  06/17/16 81.1 kg (178 lb 14.4 oz)  05/20/16 84.4 kg (186 lb 1.6 oz)    Physical Exam:  Constitutional: She appears well-developed and well-nourished. NAD. HENT: crani site clean/dry/intact---still bulging Eyes: EOMI. No discharge.   Neck: Trach stoma healed    Cardiovascular: RRR Respiratory: CTA bilaterally. Unlabored.  GI: Soft. Bowel sounds are normal.  Musculoskeletal: No edema. Left shoulder tenderness Neurological: Improved vocal output as a whole. Initiates a lot more Spastic left hemiparesis 2/4 wrist, finger flexors (no change) Moves RUE/RLE grossly 5/5.  Skin: Skin is warm and dry.  Psychiatric: pleasant and interactive  Assessment/Plan: 1. 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 Physiatrist is providing close team supervision and 24 hour management of active medical problems listed below. Physiatrist and rehab team continue to assess barriers to discharge/monitor patient progress toward functional and medical goals.  Function:  Bathing Bathing position   Position: Shower  Bathing parts Body parts bathed by patient: Left arm, Chest, Abdomen, Front perineal area, Left upper leg,  Right upper leg, Right arm, Left lower leg, Right lower leg, Back Body parts bathed by helper: Buttocks  Bathing assist Assist Level: Touching or steadying assistance(Pt > 75%)      Upper Body Dressing/Undressing Upper body dressing   What is the patient wearing?: Pull over shirt/dress     Pull over shirt/dress - Perfomed by patient: Thread/unthread right sleeve, Put head through opening Pull over shirt/dress - Perfomed by helper: Thread/unthread left sleeve, Pull shirt over trunk Button up shirt - Perfomed by patient: Thread/unthread right sleeve Button up shirt - Perfomed by helper: Thread/unthread left sleeve, Pull shirt around back, Button/unbutton shirt    Upper body assist Assist Level:  (Mod assist)      Lower Body Dressing/Undressing Lower body dressing   What is the patient wearing?: Pants, Socks     Pants- Performed by patient: Thread/unthread right pants leg Pants- Performed by helper: Thread/unthread left pants leg, Pull pants up/down   Non-skid slipper socks- Performed by helper: Don/doff right sock, Don/doff left sock Socks - Performed by patient: Don/doff right sock Socks - Performed by helper: Don/doff left sock Shoes - Performed by patient: Don/doff right shoe Shoes - Performed by helper: Don/doff right shoe, Don/doff left shoe, Fasten right, Fasten left   AFO - Performed by helper: Don/doff left AFO      Lower body assist Assist for lower body dressing:  (max assist)      Toileting Toileting Toileting activity did not occur: Refused Toileting steps completed by patient: Performs perineal hygiene Toileting steps completed by helper: Adjust clothing prior to toileting, Adjust clothing after toileting Toileting Assistive Devices: Grab bar or rail  Toileting  assist Assist level: Touching or steadying assistance (Pt.75%)   Transfers Chair/bed transfer   Chair/bed transfer method: Squat pivot Chair/bed transfer assist level: Moderate assist (Pt 50 -  74%/lift or lower) Chair/bed transfer assistive device: Armrests Mechanical lift: Stedy   Locomotion Ambulation Ambulation activity did not occur: Safety/medical concerns   Max distance: 10 Assist level: Moderate assist (Pt 50 - 74%)   Wheelchair   Type: Manual Max wheelchair distance: >200 ft Assist Level: Supervision or verbal cues  Cognition Comprehension Comprehension assist level: Understands basic 90% of the time/cues < 10% of the time  Expression Expression assist level: Expresses basic 75 - 89% of the time/requires cueing 10 - 24% of the time. Needs helper to occlude trach/needs to repeat words.  Social Interaction Social Interaction assist level: Interacts appropriately with others - No medications needed.  Problem Solving Problem solving assist level: Solves basic 75 - 89% of the time/requires cueing 10 - 24% of the time  Memory Memory assist level: Recognizes or recalls 75 - 89% of the time/requires cueing 10 - 24% of the time     Medical Problem List and Plan: 1.  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  -cont CIR ---team conf  -continue helmet when OOB  -Dr. Fargen, [facility]---cranioplasty now scheduled for 2/15---to see anaesthesiologist today -sLionel Decemberpoke to Dr. Lionel DecemberFargen Friday who said will deal with increased fluid at time of cranioplasty. Could perhaps require a shunt.   -family ed continues 2.  DVT Prophylaxis/Anticoagulation: Mechanical: Sequential compression devices, below knee Bilateral lower extremities. LLE dopplers 12/26 negative for DVT 3. Headaches/Pain Management: improved  -extremity pain is neuropathic.    -maintain gabapentin at 600mg  TID          -continue hydrocodone at 10mg  prn  -improved 4. Mood: LCSW to follow for evaluation and support when appropriate.   -depression a factor, but showing signs of improvement  -zoloft at 100mg  daily    -adderall 20mg  XR for initiation/engagement  (was also using at home for ADHD)   5. Neuropsych: This patient is not capable of making decisions on her own behalf. 6. Skin/Wound Care: remove remaining sutures.  7. Fluids/Electrolytes/Nutrition:  D3/thins per SLP  -PEG OUT 8. VDRF:  trach stoma closed 9. New onset seizures: Continue Keppra bid. Changed to 500mg  tablet 10. Spasticity/Neuropathy LUE/LLE: see above  -baclofen  20 increased to QID with benefits  -LUE/LLE ROM, splinting  11. Hypotension/bradycardia  Asyptomatic   LOS (Days) 48 A FACE TO FACE EVALUATION WAS PERFORMED  Ranelle OysterSWARTZ,ZACHARY T, MD 07/07/2016 9:18 AM

## 2016-07-07 NOTE — Progress Notes (Signed)
Speech Language Pathology Daily Session Note  Patient Details  Name: Cheryl Gibbs MRN: [id number] Date of Birth: 08/15/1990  Today's Date: 07/07/2016 SLP Individual Time: [id number] SLP Individual Time Calculation (min): 30 min  Short Term Goals: Week 7: SLP Short Term Goal 1 (Week 7): Patient will demonstrate functional problem solving with functional tasks with supervision verbal cues.  SLP Short Term Goal 2 (Week 7): Patient will recall new, daily information with supervision verbal cues.  SLP Short Term Goal 3 (Week 7): Patient will consume current diet without overt s/s of aspiration and Mod I for use of swallowing compensatory strategies.  SLP Short Term Goal 4 (Week 7): Patient will demonstrate efficient mastication and complete oral clearance with trials of regular textures over 2 sessions prior to upgrade.  SLP Short Term Goal 5 (Week 7): Patient will verbalize at the sentence level with extra time and Min A verbal cues to self-monitor and correct errors.   Skilled Therapeutic Interventions: Skilled treatment session focused on dysphagia and speech goals. SLP facilitated session with skilled observation with breakfast meal of regular textures with thin liquids. Patient consumed meal without overt s/s of aspiration and was Mod I for use of swallowing compensatory strategies. Patient also required supervision verbal cues to self-monitor and correct verbal errors to maximize length of utterance during expression of wants/needs. Patient left upright in bed with all needs within reach. Continue with current plan of care.      Function:  Eating Eating   Modified Consistency Diet: No Eating Assist Level: Swallowing techniques: self managed;Set up assist for   Eating Set Up Assist For: Opening containers;Cutting food       Cognition Comprehension Comprehension assist level: Understands basic 90% of the time/cues < 10% of the time  Expression   Expression assist level: Expresses basic 75  - 89% of the time/requires cueing 10 - 24% of the time. Needs helper to occlude trach/needs to repeat words.  Social Interaction Social Interaction assist level: Interacts appropriately with others - No medications needed.  Problem Solving Problem solving assist level: Solves basic 75 - 89% of the time/requires cueing 10 - 24% of the time  Memory Memory assist level: Recognizes or recalls 75 - 89% of the time/requires cueing 10 - 24% of the time    Pain No/Denies Pain   Therapy/Group: Individual Therapy  Cheryl Gibbs 07/07/2016, 3:21 PM

## 2016-07-08 ENCOUNTER — Inpatient Hospital Stay (HOSPITAL_COMMUNITY): Payer: Managed Care, Other (non HMO) | Admitting: Speech Pathology

## 2016-07-08 ENCOUNTER — Inpatient Hospital Stay (HOSPITAL_COMMUNITY): Payer: Managed Care, Other (non HMO)

## 2016-07-08 ENCOUNTER — Inpatient Hospital Stay (HOSPITAL_COMMUNITY): Payer: Managed Care, Other (non HMO) | Admitting: Occupational Therapy

## 2016-07-08 ENCOUNTER — Inpatient Hospital Stay (HOSPITAL_COMMUNITY): Admitting: *Deleted

## 2016-07-08 ENCOUNTER — Inpatient Hospital Stay (HOSPITAL_COMMUNITY): Payer: Managed Care, Other (non HMO) | Admitting: Physical Therapy

## 2016-07-08 MED ORDER — SERTRALINE HCL 50 MG PO TABS
150.0000 mg | ORAL_TABLET | Freq: Every day | ORAL | Status: DC
  Administered 2016-07-09: 09:00:00 150 mg via ORAL
  Filled 2016-07-08: qty 1

## 2016-07-08 NOTE — Progress Notes (Signed)
Speech Language Pathology Daily Session Note  Patient Details  Name: Cheryl ClinesLaura Volkov MRN: [id number] Date of Birth: 07/09/1990  Today's Date: 07/08/2016 SLP Individual Time: 1300-1400 SLP Individual Time Calculation (min): 60 min  Short Term Goals: Week 7: SLP Short Term Goal 1 (Week 7): Patient will demonstrate functional problem solving with functional tasks with supervision verbal cues.  SLP Short Term Goal 2 (Week 7): Patient will recall new, daily information with supervision verbal cues.  SLP Short Term Goal 3 (Week 7): Patient will consume current diet without overt s/s of aspiration and Mod I for use of swallowing compensatory strategies.  SLP Short Term Goal 4 (Week 7): Patient will demonstrate efficient mastication and complete oral clearance with trials of regular textures over 2 sessions prior to upgrade.  SLP Short Term Goal 5 (Week 7): Patient will verbalize at the sentence level with extra time and Min A verbal cues to self-monitor and correct errors.   Skilled Therapeutic Interventions: Skilled treatment session focused on addressing diagnotic treatment of cognitive-linguistic goals. SLP facilitated session by administering the [facility] Assessment (MoCA version 7.3) patient demonstrated skills consistent with a score of 22/30 with 26 or greater being considered to be [facility].  Patient identified areas to continue to work on as being: memory, vocal intensity, word finding, and quickness with thought processes and speaking.  Patient with ongoing aphasia with expressive speech with grammatical errors or omissions with pronouns, prepositions, articles and verbs.  Receptive deficits also present.  Patient will require skilled SLP services at the next level of care.  Spouse present and all questions answered.  Patient ready for discharge.    Function:  Cognition Comprehension Comprehension assist level: Understands basic 90% of the time/cues < 10% of the time  Expression    Expression assist level: Expresses basic 75 - 89% of the time/requires cueing 10 - 24% of the time. Needs helper to occlude trach/needs to repeat words.  Social Interaction Social Interaction assist level: Interacts appropriately with others with medication or extra time (anti-anxiety, antidepressant).  Problem Solving Problem solving assist level: Solves basic 75 - 89% of the time/requires cueing 10 - 24% of the time  Memory Memory assist level: Recognizes or recalls 75 - 89% of the time/requires cueing 10 - 24% of the time    Pain Pain Assessment Pain Assessment: No/denies pain  Therapy/Group: Individual Therapy  Charlane FerrettiMelissa Jazarah Capili, M.A., CCC-SLP [phone]  Jaydyn Bozzo 07/08/2016, 4:32 PM

## 2016-07-08 NOTE — Progress Notes (Signed)
Occupational Therapy Discharge Summary  Patient Details  Name: Cheryl Gibbs MRN: [id number] Date of Birth: 1991-01-28  Today's Date: 07/08/2016 OT Individual Time: 3474-2595 OT Individual Time Calculation (min): 60 min    Patient has met 13 of 13 long term goals due to improved activity tolerance, improved balance, postural control, ability to compensate for deficits, improved attention and improved awareness.  When pt was admitted, she has severely limited cognitive skills, required total A to sit unsupported, required total A of 2 to come to a stand and transfer, and needed total A with all self care. She has made a great deal of progress with her cognitive and physical skills.  Patient to discharge at overall Min to [facility] Assist level.  Patient's care partner is independent to provide the necessary physical and cognitive assistance at discharge.    Reasons goals not met: n/a  Recommendation:  Patient will benefit from ongoing skilled OT services in home health setting to continue to advance functional skills in the area of BADL.  Equipment: wide drop arm BSC  Reasons for discharge: treatment goals met  Patient/family agrees with progress made and goals achieved: Yes  OT Discharge Precautions/Restrictions  Precautions Precautions: Fall Precaution Comments: helmet when OOB Restrictions Weight Bearing Restrictions: No   Pain Pain Assessment Pain Assessment: No/denies pain ADL ADL ADL Comments: refer to navigator Vision/Perception  Vision- Assessment Eye Alignment: Within Functional Limits Ocular Range of Motion: Within Functional Limits Alignment/Gaze Preference: Within Defined Limits Tracking/Visual Pursuits: Decreased smoothness of eye movement to LEFT superior field;Decreased smoothness of eye movement to LEFT inferior field Saccades: Additional eye shifts occurred during testing (to the left) Convergence: Impaired (comment) (L eye does not fully converge) Visual Fields:  Left homonymous hemianopsia Diplopia Assessment:  (pt states she no longer has double vision) Perception Comments: On admission, she had very impaired L side inattention and now she fully attends to L side of body and scans to her L to find items  Cognition Overall Cognitive Status: Impaired/Different from baseline Orientation Level: Oriented X4 Attention: Sustained Sustained Attention: Appears intact Memory: Impaired Memory Impairment: Decreased recall of new information (only minimally impaired versus severely impaired on admission) Awareness: Impaired Awareness Impairment: Anticipatory impairment Problem Solving: Impaired Problem Solving Impairment: Functional complex Sensation Sensation Light Touch: Appears Intact Stereognosis: Impaired by gross assessment Hot/Cold: Appears Intact Proprioception: Impaired Detail Proprioception Impaired Details: Impaired LUE;Impaired LLE Additional Comments: LUE and LLE continue to be sensitive to touch, but it has greatly improved from admission Coordination Gross Motor Movements are Fluid and Coordinated: No Fine Motor Movements are Fluid and Coordinated: No Finger Nose Finger Test: LUE non functional Motor  Motor Motor: Hemiplegia;Abnormal tone;Motor apraxia Motor - Discharge Observations: mild tone in L finger flexors, subluxation of L shoulder  Mobility    see functional navigator Trunk/Postural Assessment  Cervical Assessment Cervical Assessment: Within Functional Limits Thoracic Assessment Thoracic Assessment: Within Functional Limits Lumbar Assessment Lumbar Assessment:  (posterior pelvic tilt) Postural Control Righting Reactions: mildly delayed, they have improved from severely impaired on admission  Balance Static Sitting Balance Static Sitting - Comment/# of Minutes: S Dynamic Sitting Balance Sitting balance - Comments: close S during ADLs Static Standing Balance Static Standing - Comment/# of Minutes: min  A Extremity/Trunk Assessment RUE Assessment RUE Assessment: Within Functional Limits LUE AROM (degrees) LUE Overall AROM Comments: O AROM LUE Tone LUE Tone: Mild (mild tone in finger flexors, L shoulder subluxation)   See Function Navigator for Current Functional Status.  [place] 07/08/2016, 12:52 PM

## 2016-07-08 NOTE — Progress Notes (Addendum)
Physical Therapy Session Note  Patient Details  Name: Cheryl Gibbs MRN: [id number] Date of Birth: 05/28/1990  Today's Date: 07/08/2016 PT Individual Time: [id number] and 1645-1710 PT Individual Time Calculation (min): 26 min and 25 min   Skilled Therapeutic Interventions/Progress Updates:  Treatment 1: Pt received in w/c with husband [name] present for session. Pt reports pain in buttocks from sitting in w/c for 2 hours. Educated pt & husband on pressure relief every 30 minutes during the day by transferring sit>stand. Therapist provided demonstration with Gerilyn PilgrimJacob return demonstrating assisting pt with sit>stand transfer. Educated Gerilyn PilgrimJacob on bumping pt up steps in w/c with him return demonstrating x 2 steps. Educated him on proper body mechanics, technique, and need for 2 person assist. Gerilyn PilgrimJacob also return demonstrated transferring pt up single step (3") forwards in w/c. Provided Gerilyn PilgrimJacob with handout detailing technique. Gerilyn PilgrimJacob voiced concern over pt's mother assisting her with bumping up w/c; informed him that this therapist discussed technique with pt's mother & she voiced understanding but reinforced need for her to practice with his instruction and HHPT before attempting it by herself (with another person). At end of session pt left sitting in w/c with [name] tech assisting her back to room.  Treatment 2: Pt received in bed with husband present for session. Pt reported pain in LLE, RN made aware & reports pt is premedicated. Session focused on car transfer at simulated low sedan height. Therapist provided demonstration and pt/husband return demonstrated squat pivot w/c<>car with mod assist. Pt requires cuing to minimize impulsivity when transferring with husband's assistance. Pt also requires assistance transferring LLE in/out of car. At end of session therapist educated pt, Gerilyn PilgrimJacob (husband), and pt's mother on recommendation of not attempting transferring or ambulation with hemiwalker, that pt is only  safe to attempt this with home health therapists 2/2 pt's fall risk. Pt's mother asking Gerilyn PilgrimJacob about sitters for pt but she then reports she plans to stay with pt "most of the time" and only plans to leave her with pt's grandfather "if she has to run to the store or something". Pt's mother also reports she will educate pt's grandfather on how to transfer pt. Re-educated pt & family on need for 24 hr assistance at home. Pt's mother declined hands-on participation in car transfer on this date. At end of session pt left sitting in w/c in room with family present to supervise.  Re-educated pt/family on need for pressure relief when sitting in the w/c >30 minutes.   Educated Gerilyn PilgrimJacob on pt's need to wear helmet at all times when OOB.  Therapy Documentation Precautions:  Precautions Precautions: Fall Precaution Comments: helmet when OOB Restrictions Weight Bearing Restrictions: No    See Function Navigator for Current Functional Status.   Therapy/Group: Individual Therapy  [name] 07/08/2016, 5:23 PM

## 2016-07-08 NOTE — Progress Notes (Signed)
[place] PHYSICAL MEDICINE & REHABILITATION     PROGRESS NOTE    Subjective/Complaints: In bed without specific complaints.  Husband apparently concerned about mood/depression ROS No nvd,cp,cough  Objective: Vital Signs: Blood pressure 102/62, pulse 64, temperature 98.2 F (36.8 C), temperature source Oral, resp. rate 16, height 5\' 4"  (1.626 m), weight 81.1 kg (178 lb 14.4 oz), SpO2 97 %. No results found. No results for input(s): WBC, HGB, HCT, PLT in the last 72 hours. No results for input(s): NA, K, CL, GLUCOSE, BUN, CREATININE, CALCIUM in the last 72 hours.  Invalid input(s): CO CBG (last 3)  No results for input(s): GLUCAP in the last 72 hours.  Wt Readings from Last 3 Encounters:  06/17/16 81.1 kg (178 lb 14.4 oz)  05/20/16 84.4 kg (186 lb 1.6 oz)    Physical Exam:  Constitutional: She appears well-developed and well-nourished. NAD. HENT: crani site clean/dry/intact---still bulging Eyes: EOMI. No discharge.   Neck: Trach stoma healed    Cardiovascular: RRR Respiratory: clear.  GI: Soft. Bowel sounds are normal.  Musculoskeletal: No edema. Left shoulder tenderness Neurological: Improved vocal output as a whole. Initiates a lot more Spastic left hemiparesis 2/4 wrist, finger flexors (no change) Moves RUE/RLE grossly 5/5.  Skin: Skin is warm and dry.  Psychiatric: interactive  Assessment/Plan: 1. 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 Physiatrist is providing close team supervision and 24 hour management of active medical problems listed below. Physiatrist and rehab team continue to assess barriers to discharge/monitor patient progress toward functional and medical goals.  Function:  Bathing Bathing position   Position: Shower  Bathing parts Body parts bathed by patient: Left arm, Chest, Abdomen, Front perineal area, Left  upper leg, Right upper leg, Right arm, Left lower leg, Right lower leg, Back Body parts bathed by helper: Buttocks  Bathing assist Assist Level: Touching or steadying assistance(Pt > 75%)      Upper Body Dressing/Undressing Upper body dressing   What is the patient wearing?: Button up shirt     Pull over shirt/dress - Perfomed by patient: Thread/unthread right sleeve, Put head through opening Pull over shirt/dress - Perfomed by helper: Thread/unthread left sleeve, Pull shirt over trunk Button up shirt - Perfomed by patient: Thread/unthread right sleeve, Button/unbutton shirt Button up shirt - Perfomed by helper: Thread/unthread left sleeve, Pull shirt around back    Upper body assist Assist Level: Touching or steadying assistance(Pt > 75%)      Lower Body Dressing/Undressing Lower body dressing   What is the patient wearing?: Pants, Socks, Shoes, AFO     Pants- Performed by patient: Thread/unthread right pants leg Pants- Performed by helper: Thread/unthread left pants leg, Pull pants up/down   Non-skid slipper socks- Performed by helper: Don/doff right sock, Don/doff left sock Socks - Performed by patient: Don/doff right sock Socks - Performed by helper: Don/doff left sock Shoes - Performed by patient: Don/doff right shoe Shoes - Performed by helper: Don/doff left shoe, Fasten right, Fasten left   AFO - Performed by helper: Don/doff left AFO      Lower body assist Assist for lower body dressing: Touching or steadying assistance (Pt > 75%)      Toileting Toileting Toileting activity did not occur: Refused Toileting steps completed by patient: Performs perineal hygiene Toileting steps completed by helper: Adjust clothing prior to toileting, Adjust clothing after toileting Toileting Assistive Devices: Grab bar or rail  Toileting assist Assist level: Touching or steadying assistance (Pt.75%)   Transfers Chair/bed transfer   Chair/bed transfer method: Squat pivot Chair/bed  transfer assist level: Moderate assist (Pt 50 - 74%/lift or lower) Chair/bed transfer assistive device: Armrests Mechanical lift: Stedy   Locomotion Ambulation Ambulation activity did not occur: Safety/medical concerns   Max distance: 10 Assist level: Moderate assist (Pt 50 - 74%)   Wheelchair   Type: Manual Max wheelchair distance: 150 ft  Assist Level: Supervision or verbal cues  Cognition Comprehension Comprehension assist level: Understands basic 90% of the time/cues < 10% of the time  Expression Expression assist level: Expresses basic 75 - 89% of the time/requires cueing 10 - 24% of the time. Needs helper to occlude trach/needs to repeat words.  Social Interaction Social Interaction assist level: Interacts appropriately with others - No medications needed.  Problem Solving Problem solving assist level: Solves basic 75 - 89% of the time/requires cueing 10 - 24% of the time  Memory Memory assist level: Recognizes or recalls 75 - 89% of the time/requires cueing 10 - 24% of the time     Medical Problem List and Plan: 1.  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  -cont CIR--finalizing dc planning  -continue helmet when OOB  -Dr. Lionel DecemberFargen, [facility]---cranioplasty now scheduled for 2/15---to see anaesthesiologist today -spoke to Dr. Lionel DecemberFargen Friday who said will deal with increased fluid at time of cranioplasty. Could perhaps require a shunt. For cranioplasty tomorrow    2.  DVT Prophylaxis/Anticoagulation: Mechanical: Sequential compression devices, below knee Bilateral lower extremities. LLE dopplers 12/26 negative for DVT 3. Headaches/Pain Management: improved  -extremity pain is neuropathic.    -maintain gabapentin at 600mg  TID          -continue hydrocodone at 10mg  prn  -improved 4. Mood: LCSW to follow for evaluation and support when appropriate.   -some increased anxiety/depression?  -zoloft at 100mg  daily   (increased during stay)---consider increase to 150mg   -will need further emotional support from family and professionals moving forward  -adderall 20mg  XR for initiation/engagement (was also using at home for ADHD)    -nicotine patch 5. Neuropsych: This patient is not capable of making decisions on her own behalf. 6. Skin/Wound Care: intact.  7. Fluids/Electrolytes/Nutrition:  D3/thins per SLP  -PEG OUT 8. VDRF:  trach stoma closed 9. New onset seizures: Continue Keppra bid. Changed to 500mg  tablet 10. Spasticity/Neuropathy LUE/LLE: see above  -baclofen  20 increased to QID with benefits  -LUE/LLE ROM, splinting  11. Hypotension/bradycardia  Asyptomatic   LOS (Days) 49 A FACE TO FACE EVALUATION WAS PERFORMED  Ranelle OysterSWARTZ,Halbert Jesson T, MD 07/08/2016 11:51 AM

## 2016-07-08 NOTE — Progress Notes (Signed)
Physical Therapy Note  Patient Details  Name: Ned ClinesLaura Pulaski MRN: [id number] Date of Birth: 03/11/1991 Today's Date: 07/08/2016  1140-1210, 30 min individual tx Pain: no c/o  Husband Gerilyn PilgrimJacob here for family ed. He donned L ankle air cast and bil shoes, with extra time.  He performed safe return demonstration of stand/pivot transfers w/c>< high bed in ADL apt.  PT emphasized need for him to pivot her L foot during transfer to avoid rolling over ankle. Bed mobility instruction on actual bed, supine>< sit on L side of bed.  He supervised her propelling w/c back to room, and corrected her regarding route- finding.  Pt left resting in w/c,  in care of husband.    Zabian Swayne 07/08/2016, 12:16 PM

## 2016-07-08 NOTE — Plan of Care (Signed)
Problem: RH Bed Mobility Goal: LTG Patient will perform bed mobility with assist (PT) LTG: Patient will perform bed mobility with assistance, with/without cues (PT).  Outcome: Completed/Met Date Met: 07/08/16 With hospital bed features  Problem: RH Bed to Chair Transfers Goal: LTG Patient will perform bed/chair transfers w/assist (PT) LTG: Patient will perform bed/chair transfers with assistance, with/without cues (PT).  Outcome: Completed/Met Date Met: 07/08/16 Squat pivot  Problem: RH Car Transfers Goal: LTG Patient will perform car transfers with assist (PT) LTG: Patient will perform car transfers with assistance (PT).  Outcome: Completed/Met Date Met: 07/08/16 Squat pivot  Problem: RH Ambulation Goal: LTG Patient will ambulate in controlled environment (PT) LTG: Patient will ambulate in a controlled environment, # of feet with assistance (PT).  Outcome: Completed/Met Date Met: 07/08/16 50 ft with hemiwalker  Problem: RH Wheelchair Mobility Goal: LTG Patient will propel w/c in controlled environment (PT) LTG: Patient will propel wheelchair in controlled environment, # of feet with assist (PT)  Outcome: Completed/Met Date Met: 07/08/16 150 ft  Goal: LTG Patient will propel w/c in home environment (PT) LTG: Patient will propel wheelchair in home environment, # of feet with assistance (PT).  Outcome: Completed/Met Date Met: 07/08/16 50 ft

## 2016-07-08 NOTE — Patient Care Conference (Signed)
Inpatient RehabilitationTeam Conference and Plan of Care Update Date: 07/07/2016   Time: 2:30 PM    Patient Name: Cheryl Gibbs      Medical Record Number: [id number]  Date of Birth: 08-01-1990 Sex: Female         Room/Bed: 4W16C/4W16C-01 Payor Info: Payor: TRICARE / Plan: TRICARE / Product Type: *No Product type* /    Admitting Diagnosis: Postpartum  Admit Date/Time:  05/20/2016  5:38 PM Admission Comments: No comment available   Primary Diagnosis:  Hemorrhage, intracranial (HCC) Principal Problem: Hemorrhage, intracranial (HCC)  Patient Active Problem List   Diagnosis Date Noted  . Posttraumatic stress disorder   . Adjustment disorder with depressed mood   . Hypotension   . Bradycardia   . Hemorrhage, intracranial (HCC) 05/20/2016  . Global aphasia   . Spastic hemiparesis affecting nondominant side (HCC)   . Vascular headache   . Dysphagia, post-stroke   . Tracheostomy status (HCC)   . Seizure (HCC)   . Neuropathy (HCC)   . Nonverbal     Expected Discharge Date: Expected Discharge Date: 07/09/16  Team Members Present: Physician leading conference: Dr. Faith Rogue Social Worker Present: Cheryl Jupiter, LCSW Nurse Present: Cheryl Bacon, RN PT Present: Cheryl Gibbs, PT;Other (comment) Cheryl Gibbs, PT) OT Present: Cheryl Gibbs, COTA;Cheryl Gibbs, OT SLP Present: Cheryl Gibbs, SLP Other (Discipline and Name): Cheryl Phenix, PsyD PPS Coordinator present : Cheryl Duck, RN, CRRN     Current Status/Progress Goal Weekly Team Focus  Medical   intake good. becoming anxious about leaving, wanting to smoke/drink, PEG out without issues  continue with dispo plan, finalize logistics  preparing for transfer back to [facility] for cranioplasty,    Bowel/Bladder   Continent of bowel and baldder LBM 07-07-15  Remain continent of B/B  Assist with toileting needs prn   Swallow/Nutrition/ Hydration   Regular textures with thin liquids, Intermittent supervision   Min A with least  restrictive diet  tolerance of current diet    ADL's   Min-mod A squat pivots; mod- max standing balance; mod- Max A LB dressing  mod A standing balance, toilet transfer. max A with LB dressing; min A bathing and UB dressing; max A toileting (tub transfer goal is being worked on in therapy, but no goal due to no accessible set up at home at this time)  ADL re-training; d/c planning; family ed   Mobility   mod assist transfers, mod/max gait with hemiwalker, requires cuing for transfers  mod/max assist overall with transfers & gait, supervision w/c mobility  pt/family education, d/c planning, transfers, w/c mobility, gait   Communication   Min A for verbal expression   Mod I for multimodal communication  expanding length of utterance    Safety/Cognition/ Behavioral Observations  Min A  Min A  Family education    Pain   pain managed with prn norco q4  <2  Assess pain q shift and prn   Skin   Peg site, scabbed over  No new skin issue or injury  Monitor skin q shift and prn       *See Care Plan and progress notes for long and short-term goals.  Barriers to Discharge: left hemiparesis, poor insight and awareness, cranial defect    Possible Resolutions to Barriers:  family and patient ed. supervision at home, transfer to [facility] for surgery    Discharge Planning/Teaching Needs:  Mother confirms plan for pt to come home with her and she will provide/ coordinate 24/7 physical assistance.  Education  completed with mother   Team Discussion:  Pt continues to make progress.  Diet upgraded.  Working to extend Musicianverbal phrasing.  [facility] and then home with mother.  Education completed, however, mother's reports of who caregiving team will be continue to change.  Hope for husband to arrive in town today for a few days.  Revisions to Treatment Plan:  None   Continued Need for Acute Rehabilitation Level of Care: The patient requires daily medical management by a  physician with specialized training in physical medicine and rehabilitation for the following conditions: Daily direction of a multidisciplinary physical rehabilitation program to ensure safe treatment while eliciting the highest outcome that is of practical value to the patient.: Yes Daily medical management of patient stability for increased activity during participation in an intensive rehabilitation regime.: Yes Daily analysis of laboratory values and/or radiology reports with any subsequent need for medication adjustment of medical intervention for : Neurological problems;Post surgical problems;Wound care problems  Cheryl Gibbs 07/08/2016, 2:03 PM

## 2016-07-08 NOTE — Progress Notes (Signed)
Husband concerned about pt mental wellbeing.  Husband reports that pt is tearful, doesn't want to live like this anymore, and is a burden to everyone.  Notified PA of husband concerns.  Husband left and pt in room eating breakfast.  Didn't observe pt to be depressed or tearful at this time. Will continue plan of care.

## 2016-07-08 NOTE — Progress Notes (Signed)
Occupational Therapy Session Note  Patient Details  Name: Ned ClinesLaura Sandoz MRN: [id number] Date of Birth: 07/11/1990  Today's Date: 07/08/2016 OT Individual Time: 3086-57840830-0930 OT Individual Time Calculation (min): 60 min    Short Term Goals: Week 6:  OT Short Term Goal 1 (Week 6): STGs = LTGs  Skilled Therapeutic Interventions/Progress Updates:    Pt seen for tx session with focus on family education with pt's spouse. Hands on demonstration with spouse for bed mobility, bed to w/c, w/c to toilet and to tub bench. [facility]  Pt demonstrated her adaptive techniques she uses to spouse for bathing and dressing.  Sit to stand with bar and static stand without bar so she could cleanse self and pull pants up 80% of the way.  Reviewed ROM and precautions with LUE.  Pt prepared for discharge for her medical procedure and then to home with family.    Therapy Documentation Precautions:  Precautions Precautions: Fall Precaution Comments: helmet when OOB Restrictions Weight Bearing Restrictions: No   Pain: Pain Assessment Pain Assessment: No/denies pain ADL: ADL ADL Comments: refer to navigator  See Function Navigator for Current Functional Status.   Therapy/Group: Individual Therapy  SAGUIER,JULIA 07/08/2016, 12:36 PM

## 2016-07-08 NOTE — Progress Notes (Signed)
Physical Therapy Discharge Summary  Patient Details  Name: Cheryl Gibbs MRN: [id number] Date of Birth: 1991-05-07  Today's Date: 07/08/2016  Patient has met 10 of 10 long term goals due to improved activity tolerance, improved balance, improved postural control, increased strength, ability to compensate for deficits, functional use of  right upper extremity and right lower extremity, improved attention, improved awareness and improved coordination.  Patient to discharge at a wheelchair level Supervision.   Patient's mother and husband have been educated & participated in hands-on training during physical therapy sessions, but both would benefit from additional participation in hands-on training to ensure pt's safety upon d/c.  This therapist is recommending that pt d/c at a w/c level with 24 hr assistance/supervision. During time here pt & family have been educated that pt is unsafe to attempt transfers or gait with hemiwalker at home with family/friends.   Reasons goals not met: n/a  Recommendation:  Patient will benefit from ongoing skilled PT services in home health setting to continue to advance safe functional mobility, address ongoing impairments in L inattention, L NMR, gait training, transfer training, stair negotiation, w/c mobility, safety awareness, pt/caregiver training and education, and minimize fall risk.  Equipment: w/c, hemiwalker, hospital bed  Reasons for discharge: treatment goals met  Patient/family agrees with progress made and goals achieved: Yes  PT Discharge Precautions/Restrictions Precautions Precautions: Fall Precaution Comments: helmet donned at all times when OOB  Vision/Perception  L inattention.  Cognition Overall Cognitive Status: Impaired/Different from baseline Arousal/Alertness: Awake/alert Orientation Level: Oriented to person;Oriented to place Memory: Impaired Memory Impairment: Decreased recall of new information Awareness: Impaired Awareness  Impairment: Anticipatory impairment Problem Solving: Impaired Executive Function: Sequencing Sequencing: Impaired (requires cuing for transfers) Safety/Judgment: Impaired  Sensation Sensation Light Touch: Impaired by gross assessment Light Touch Impaired Details: Impaired LLE Proprioception: Impaired by gross assessment Proprioception Impaired Details: Impaired LLE Coordination Gross Motor Movements are Fluid and Coordinated: No Fine Motor Movements are Fluid and Coordinated: No  Motor  Motor Motor: Hemiplegia (left UE/LE) Motor - Skilled Clinical Observations: general weakness   Mobility Bed Mobility Bed Mobility: Supine to Sit;Sit to Supine Supine to Sit: 4: Min assist;With rails;HOB elevated Supine to Sit Details: Manual facilitation for weight shifting;Verbal cues for technique;Verbal cues for sequencing;Verbal cues for precautions/safety Sit to Supine: 4: Min assist Sit to Supine - Details: Verbal cues for technique;Verbal cues for sequencing;Verbal cues for precautions/safety Transfers Transfers: Yes Sit to Stand: 4: Min assist;With armrests Sit to Stand Details: Tactile cues for initiation;Tactile cues for sequencing;Tactile cues for weight shifting Squat Pivot Transfers: 3: Mod assist;With armrests Squat Pivot Transfer Details: Tactile cues for initiation;Tactile cues for sequencing;Tactile cues for weight shifting;Tactile cues for placement;Tactile cues for posture;Verbal cues for sequencing;Verbal cues for technique;Manual facilitation for placement;Manual facilitation for weight shifting  Locomotion  Ambulation Ambulation: Yes Ambulation/Gait Assistance: 3: Mod assist Ambulation Distance (Feet): 50 Feet Assistive device: Hemi-walker Ambulation/Gait Assistance Details: Tactile cues for initiation;Tactile cues for sequencing;Tactile cues for weight shifting;Tactile cues for placement;Tactile cues for weight beaing;Tactile cues for posture;Verbal cues for  sequencing;Verbal cues for safe use of DME/AE;Verbal cues for precautions/safety;Verbal cues for technique;Manual facilitation for weight shifting;Manual facilitation for placement Ambulation/Gait Assistance Details: total assist for advancment & placement of LLE, blocking L knee to prevent buckling/hyperextension Stairs / Additional Locomotion Stairs: Yes Stairs Assistance: 1: +2 Total assist (max assist + 2nd person for safety) Stairs Assistance Details: Tactile cues for initiation;Tactile cues for weight shifting;Tactile cues for weight beaing;Tactile cues for placement;Tactile cues for sequencing;Tactile  cues for posture;Verbal cues for sequencing;Verbal cues for technique;Verbal cues for precautions/safety;Manual facilitation for weight shifting;Manual facilitation for weight bearing;Manual facilitation for placement;Verbal cues for gait pattern Stairs Assistance Details (indicate cue type and reason): max assist for advancement & placement of LLE, stabilizing L knee Stair Management Technique: One rail Right Number of Stairs: 12 Height of Stairs:  (6" + 3") Wheelchair Mobility Wheelchair Mobility: Yes Wheelchair Assistance: 5: Supervision (in controlled environment) Wheelchair Assistance Details: Verbal cues for Marketing executive: Right upper extremity;Right lower extremity Wheelchair Parts Management: Needs assistance Distance: 150 ft   Trunk/Postural Assessment  Postural Control Postural Control: Deficits on evaluation Righting Reactions: impaired   Balance Balance Balance Assessed: Yes Static Sitting Balance Static Sitting - Balance Support: Bilateral upper extremity supported Static Sitting - Level of Assistance: 5: Stand by assistance Static Standing Balance Static Standing - Balance Support: No upper extremity supported Static Standing - Level of Assistance: 4: Min assist Static Standing - Comment/# of Minutes: 1 Dynamic Standing Balance Dynamic Standing -  Balance Support: Right upper extremity supported Dynamic Standing - Level of Assistance: 3: Mod assist Dynamic Standing - Balance Activities:  (gait)  Extremity Assessment  RUE Assessment RUE Assessment: Within Functional Limits LUE Assessment LUE Assessment: Exceptions to WFL LUE AROM (degrees) LUE Overall AROM Comments: no AROM RLE Assessment RLE Assessment: Within Functional Limits LLE Assessment LLE Assessment: Exceptions to [facility] (no AROM, tone in heel cord & requires assistance to achieve neutral ankle ROM)   See Function Navigator for Current Functional Status.  Waunita Schooner 07/08/2016, 5:41 PM

## 2016-07-08 NOTE — Progress Notes (Signed)
Social Work Patient ID: Ned ClinesLaura Shults, female   DOB: 07/16/1990, 26 y.o.   MRN: [id number]  Have reviewed team conference information with pt, mother and spouse.  [facility] and then home with mother and family providing 24/7 assistance.  All HH and DME has been arranged.  Pt some tearful today with staff and will likely benefit form OP neuropsychology support which MD can refer.  Will provide information on the local [facility] Support Group as well.    Gaetana Kawahara, LCSW

## 2016-07-08 NOTE — Progress Notes (Signed)
Start of shift, pt and husband are speaking in her room and she is anxious/ tearful, verbalizing feelings of "not wanting to live like this" and "she is not progressing as well as she should be". Encouraged pt that she is progressing very well and pt's husband tried to encourage pt also, he also reminded her how amazed the surgeon was at how well she is doing. Emotional support given and one hour later, pt was smiling, joking with her husband and eating pizza together, appearing happy and calm.

## 2016-07-09 MED ORDER — TRAZODONE HCL 50 MG PO TABS: 25.0000 mg | ORAL_TABLET | Freq: Every evening | ORAL | 0 refills | Status: AC | PRN

## 2016-07-09 MED ORDER — POLYETHYLENE GLYCOL 3350 17 G PO PACK: 17.0000 g | PACK | Freq: Every day | ORAL | 0 refills | Status: AC | PRN

## 2016-07-09 MED ORDER — LEVETIRACETAM 500 MG PO TABS: 500.0000 mg | ORAL_TABLET | Freq: Two times a day (BID) | ORAL | 0 refills | Status: AC

## 2016-07-09 MED ORDER — SENNA 8.6 MG PO TABS: 2.0000 | ORAL_TABLET | Freq: Every day | ORAL | 0 refills | Status: AC

## 2016-07-09 MED ORDER — NAPHAZOLINE-GLYCERIN 0.012-0.2 % OP SOLN: 1.0000 [drp] | Freq: Three times a day (TID) | OPHTHALMIC | 0 refills | Status: AC

## 2016-07-09 MED ORDER — ATORVASTATIN CALCIUM 20 MG PO TABS: 20.0000 mg | ORAL_TABLET | Freq: Every day | ORAL | 0 refills | Status: AC

## 2016-07-09 MED ORDER — SERTRALINE HCL 100 MG PO TABS: 150.0000 mg | ORAL_TABLET | Freq: Every day | ORAL | 0 refills | Status: AC

## 2016-07-09 MED ORDER — AMPHETAMINE-DEXTROAMPHET ER 20 MG PO CP24: 20.0000 mg | ORAL_CAPSULE | Freq: Every day | ORAL | 0 refills | Status: AC

## 2016-07-09 MED ORDER — GABAPENTIN 600 MG PO TABS: 600.0000 mg | ORAL_TABLET | Freq: Three times a day (TID) | ORAL | 0 refills | Status: AC

## 2016-07-09 MED ORDER — HYDROCODONE-ACETAMINOPHEN 10-325 MG PO TABS: 1.0000 | ORAL_TABLET | Freq: Two times a day (BID) | ORAL | 0 refills | Status: AC | PRN

## 2016-07-09 MED ORDER — BACLOFEN 20 MG PO TABS: 20.0000 mg | ORAL_TABLET | Freq: Three times a day (TID) | ORAL | 0 refills | Status: AC

## 2016-07-09 MED ORDER — FAMOTIDINE 10 MG PO TABS: 10.0000 mg | ORAL_TABLET | Freq: Every day | ORAL | 0 refills | Status: AC

## 2016-07-09 NOTE — Progress Notes (Signed)
Speech Language Pathology Discharge Summary  Patient Details  Name: Cheryl Gibbs MRN: [id number] Date of Birth: 28-Jan-1991   Patient has met 9 of 9 long term goals.  Patient to discharge at overall Supervision;Min level.   Reasons goals not met: N/A   Clinical Impression/Discharge Summary: Patient has made excellent gains and has met 9 of 9 LTG's this admission. Currently, patient is consuming regular textures with thin liquids with minimal overt s/s of aspiration and is Mod I for use of swallowing compensatory strategies. Patient requires overall supervision-Min A to complete functional and familiar tasks safely in regards to recall of new information, emergent awareness, problem solving and selective attention. Patient currently verbalizes at the phrase level and requires overall Min A to self-monitor and correct verbal errors as well as utilization of grammar to increase length of utterance and improve specificity of verbalizations. Patient and family education is complete and patient will eventually discharge home with family after completion of procedure that will take place at another hospital. Patient would benefit from f/u SLP services to maximize her cognitive-linguistic function and overall functional independence in order to decrease caregiver burden.   Care Partner:  Caregiver Able to Provide Assistance: Yes  Type of Caregiver Assistance: Physical;Cognitive  Recommendation:  24 hour supervision/assistance;Home Health SLP  Rationale for SLP Follow Up: Reduce caregiver burden;Maximize cognitive function and independence;Maximize functional communication   Equipment: N/A   Reasons for discharge: Treatment goals met;Discharged from hospital   Patient/Family Agrees with Progress Made and Goals Achieved: Yes     [place], [facility] 07/09/2016, 7:09 AM

## 2016-07-09 NOTE — Progress Notes (Signed)
[facility]IT[facility]     PROGRESS NOTE    Subjective/Complaints: ROS No new complaints. Denies pain, still wants to smoke. Wants cigarette on ride to hospital  Objective: Vital Signs: Blood pressure 102/62, pulse (!) 53, temperature 98.9 F (37.2 C), temperature source Oral, resp. rate 16, height 5\' 4"  (1.626 m), weight 81.1 kg (178 lb 14.4 oz), SpO2 98 %. No results found. No results for input(s): WBC, HGB, HCT, PLT in the last 72 hours. No results for input(s): NA, K, CL, GLUCOSE, BUN, CREATININE, CALCIUM in the last 72 hours.  Invalid input(s): CO CBG (last 3)  No results for input(s): GLUCAP in the last 72 hours.  Wt Readings from Last 3 Encounters:  06/17/16 81.1 kg (178 lb 14.4 oz)  05/20/16 84.4 kg (186 lb 1.6 oz)    Physical Exam:  Constitutional: She appears well-developed and well-nourished. NAD. HENT: crani site clean/dry/intact---persistent bulging Eyes: EOMI. No discharge.   Neck: Trach stoma healed    Cardiovascular: RRR Respiratory: clear  GI: Soft. Bowel sounds are normal.  Musculoskeletal: No edema. Left shoulder tenderness Neurological: Improved vocal output as a whole. 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  Skin: Skin is warm and dry.  Psychiatric: interactive  Assessment/Plan: 1. 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 Physiatrist is providing close team supervision and 24 hour management of active medical problems listed below. Physiatrist and rehab team continue to assess barriers to discharge/monitor patient progress toward functional and medical goals.  Function:  Bathing Bathing position   Position: Shower  Bathing parts Body parts bathed by patient: Left arm, Chest, Abdomen, Front perineal area, Left  upper leg, Right upper leg, Right arm, Left lower leg, Right lower leg, Back Body parts bathed by helper: Buttocks  Bathing assist Assist Level: Touching or steadying assistance(Pt > 75%)      Upper Body Dressing/Undressing Upper body dressing   What is the patient wearing?: Button up shirt     Pull over shirt/dress - Perfomed by patient: Thread/unthread right sleeve, Put head through opening Pull over shirt/dress - Perfomed by helper: Thread/unthread left sleeve, Pull shirt over trunk Button up shirt - Perfomed by patient: Thread/unthread right sleeve, Pull shirt around back Button up shirt - Perfomed by helper: Button/unbutton shirt, Thread/unthread left sleeve    Upper body assist Assist Level: Touching or steadying assistance(Pt > 75%)      Lower Body Dressing/Undressing Lower body dressing   What is the patient wearing?: Pants, Socks, Non-skid slipper socks     Pants- Performed by patient: Thread/unthread right pants leg Pants- Performed by helper: Thread/unthread left pants leg, Pull pants up/down   Non-skid slipper socks- Performed by helper: Don/doff right sock, Don/doff left sock Socks - Performed by patient: Don/doff right sock, Don/doff left sock Socks - Performed by helper: Don/doff left sock Shoes - Performed by patient: Don/doff right shoe Shoes - Performed by helper: Don/doff left shoe, Fasten right, Fasten left   AFO - Performed by helper: Don/doff left AFO      Lower body assist Assist for lower body dressing: Touching or steadying assistance (Pt > 75%)      Toileting Toileting Toileting activity did not occur: Refused Toileting steps completed by patient: Performs perineal hygiene Toileting steps completed by helper: Adjust clothing prior to toileting, Adjust clothing after toileting Toileting Assistive  Devices: Grab bar or rail  Toileting assist Assist level: Touching or steadying assistance (Pt.75%)   Transfers Chair/bed transfer   Chair/bed transfer  method: Stand pivot Chair/bed transfer assist level: Touching or steadying assistance (Pt > 75%) Chair/bed transfer assistive device: Armrests Mechanical lift: Stedy   Locomotion Ambulation Ambulation activity did not occur: Safety/medical concerns   Max distance: 10 Assist level: Moderate assist (Pt 50 - 74%)   Wheelchair   Type: Manual Max wheelchair distance: 150 Assist Level: Supervision or verbal cues  Cognition Comprehension Comprehension assist level: Understands basic 90% of the time/cues < 10% of the time  Expression Expression assist level: Expresses basic 75 - 89% of the time/requires cueing 10 - 24% of the time. Needs helper to occlude trach/needs to repeat words.  Social Interaction Social Interaction assist level: Interacts appropriately with others with medication or extra time (anti-anxiety, antidepressant).  Problem Solving Problem solving assist level: Solves basic 75 - 89% of the time/requires cueing 10 - 24% of the time  Memory Memory assist level: Recognizes or recalls 75 - 89% of the time/requires cueing 10 - 24% of the time     Medical Problem List and Plan: 1.  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  -cont CIR--finalizing dc planning  -continue helmet when OOB  -[facility]  -follow up with me in about 1-2 weeks for TC visit assuming she's medically appropriate  -reviewed again the importance of smoking cessation and etoh abstinence upon discharge home    2.  DVT Prophylaxis/Anticoagulation: Mechanical: Sequential compression devices, below knee Bilateral lower extremities. LLE dopplers 12/26 negative for DVT 3. Headaches/Pain Management: improved       -maintain gabapentin at 600mg  TID          -continue hydrocodone at 10mg  prn  -improved 4. Mood: LCSW to follow for evaluation and support when appropriate.   -some increased anxiety/depression?  -zoloft  increased to 150mg  daily  -will need further emotional support from family and professionals moving forward  -adderall 20mg  XR for initiation/engagement (was also using at home for ADHD)    -nicotine patch--dc 5. Neuropsych: This patient is not capable of making decisions on her own behalf. 6. Skin/Wound Care: intact.  7. Fluids/Electrolytes/Nutrition:  D3/thins per SLP  -PEG OUT 8. VDRF:  trach stoma closed 9. New onset seizures: Continue Keppra bid. Changed to 500mg  tablet 10. Spasticity/Neuropathy LUE/LLE: see above  -baclofen  20 increased to QID with benefits  -LUE/LLE ROM, splinting  11. Hypotension/bradycardia  Asyptomatic   LOS (Days) 50 A FACE TO FACE EVALUATION WAS PERFORMED  Ranelle OysterSWARTZ,Randall Colden T, MD 07/09/2016 10:06 AM

## 2016-07-09 NOTE — Discharge Instructions (Signed)
Inpatient Rehab Discharge Instructions  Cheryl ClinesLaura Gibbs Discharge date and time:  07/09/16  Activities/Precautions/ Functional Status: Activity: no lifting, driving, or strenuous exercise for till cleared by MD.  Diet: regular.  Wound Care: keep wound clean and dry   Functional status:  ___ No restrictions     ___ Walk up steps independently _X__ 24/7 supervision/assistance   ___ Walk up steps with assistance ___ Intermittent supervision/assistance  ___ Bathe/dress independently ___ Walk with walker     _X__ Bathe/dress with assistance ___ Walk Independently    ___ Shower independently ___ Walk with assistance    _X__ Shower with assistance _X__ No alcohol     ___ Return to work/school ________      COMMUNITY REFERRALS UPON DISCHARGE:    Home Health:   PT     OT     ST    RN                      Agency:  [facility] Phone: [phone]    Medical Equipment/Items Ordered:  Wheelchair, cushion, hemi walker, hospital bed and commode                                                      Agency/Supplier:  [facility] @ [phone]    GENERAL COMMUNITY RESOURCES FOR PATIENT/FAMILY:  Support Groups: Brain Injury Support Group    Caregiver Support: same   Mental Health:  Dr. Riley KillSwartz can refer for outpatient neuropsychology as needed         Special Instructions: 1. Limit walking with therapy only.  2. [facility] before discharge.    My questions have been answered and I understand these instructions. I will adhere to these goals and the provided educational materials after my discharge from the hospital.  Patient/Caregiver Signature _______________________________ Date __________  Clinician Signature _______________________________________ Date __________  Please bring this form and your medication list with you to all your follow-up doctor's appointments.

## 2016-07-09 NOTE — Plan of Care (Signed)
Problem: RH SAFETY Goal: RH STG ADHERE TO SAFETY PRECAUTIONS W/ASSISTANCE/DEVICE STG Adhere to Safety Precautions With max Assistance/Device.   Outcome: Progressing Calls for help appropriately.

## 2016-07-09 NOTE — Progress Notes (Signed)
Recreational Therapy Discharge Summary Patient Details  Name: Cheryl Gibbs MRN: [id number] Date of Birth: 1991-05-15 Today's Date: 07/09/2016  Long term goals set: 1  Long term goals met: 1  Comments on progress toward goals: Pt has made good progress toward goal and is discharging to [facility] for surgery today.  Pt is currently at set up/supervision level for simple TR task seated w/c level.  Education provided throughout about healthy lifestyle changes with emphasis on smoking cessation and alcohol abstinence.  Pt continue to require max cues/encouragment to follow through with these recommendations.  Reasons for discharge: discharge from hospital Patient/family agrees with progress made and goals achieved: Yes   LATE ENTRY from 07/08/16 Pain:  No c/o Met with pt and husband to discuss leisure participation post discharge and pt's husband had questions about follow up therapies.  Session spent answering questions per progress notes in the chart.  Pt stated anxiety about upcoming surgery, emotional support provided.  Kiaira Pointer 07/09/2016, 2:45 PM

## 2016-07-09 NOTE — Progress Notes (Signed)
Social Work  Discharge Note  The overall goal for the admission was met for:   Discharge location: Yes - initially d/c to [facility] for planned surgery then to d/c home with mother providing 24/7 assistance  Length of Stay: Yes  Discharge activity level: Yes - mod/max overall at w/c level  Home/community participation: Yes  Services provided included: MD, RD, PT, OT, SLP, RN, TR, Pharmacy, Neuropsych and SW  Financial Services: Private Insurance: Surveyor, quantity  Follow-up services arranged: Home Health: RN, PT, OT, ST via [place], DME: 20x18 lightweight w/c with left 1/2 laptray, cushion, hemi-walker, hospital bed and wide drop arm commode via [place], Other: assisted pt with completion of SSD application once it was confirmed that [facility] had NOT submitted this. and Patient/Family has no preference for HH/DME agencies  Comments (or additional information):  Patient/Family verbalized understanding of follow-up arrangements: Yes  Individual responsible for coordination of the follow-up plan: pt/spouse  Confirmed correct DME delivered: Kendal Raffo 07/09/2016    Tarius Stangelo

## 2016-07-10 ENCOUNTER — Telehealth: Payer: Self-pay

## 2016-07-10 NOTE — Telephone Encounter (Signed)
Patient is still in hospital, was [facility] on [facility] discharge date yesterday.

## 2016-07-13 ENCOUNTER — Encounter: Admitting: Physical Medicine & Rehabilitation

## 2016-07-22 ENCOUNTER — Telehealth: Payer: Self-pay | Admitting: *Deleted

## 2016-07-22 DIAGNOSIS — I629 Nontraumatic intracranial hemorrhage, unspecified: Secondary | ICD-10-CM

## 2016-07-22 DIAGNOSIS — G8110 Spastic hemiplegia affecting unspecified side: Secondary | ICD-10-CM

## 2016-07-22 NOTE — Telephone Encounter (Signed)
Molly Maduroobert [facility] called and says that Vernona RiegerLaura is ready to transition to outpt therapy [facility]  Referral placed.

## 2016-07-24 ENCOUNTER — Telehealth: Payer: Self-pay

## 2016-07-24 NOTE — Telephone Encounter (Signed)
error 

## 2016-08-12 ENCOUNTER — Telehealth: Payer: Self-pay | Admitting: *Deleted

## 2016-08-12 DIAGNOSIS — I629 Nontraumatic intracranial hemorrhage, unspecified: Secondary | ICD-10-CM

## 2016-08-12 DIAGNOSIS — G8110 Spastic hemiplegia affecting unspecified side: Secondary | ICD-10-CM

## 2016-08-12 NOTE — Telephone Encounter (Signed)
Benedetto Goadobert [facility] about the out pt therapy this woman is needing to be scheduled for. [facility] [place] [facility] [place] fax #[phone].  They have not heard anything.  I looked back and a referral was placed to OT in [place] on 07/22/16 but we did not have the name of the site.  New orders placed for OT,ST,PT. I left Molly MaduroRobert a message letting him know this is being done.

## 2017-06-25 ENCOUNTER — Other Ambulatory Visit: Payer: Self-pay | Admitting: Physical Medicine and Rehabilitation

## 2018-11-05 IMAGING — CR DG CHEST 1V PORT
1 series · 1 of 1 positions shown · non-contrast
Comparison: None.

CLINICAL DATA: Tracheostomy tube placement

EXAM:
PORTABLE CHEST 1 VIEW

[AP]
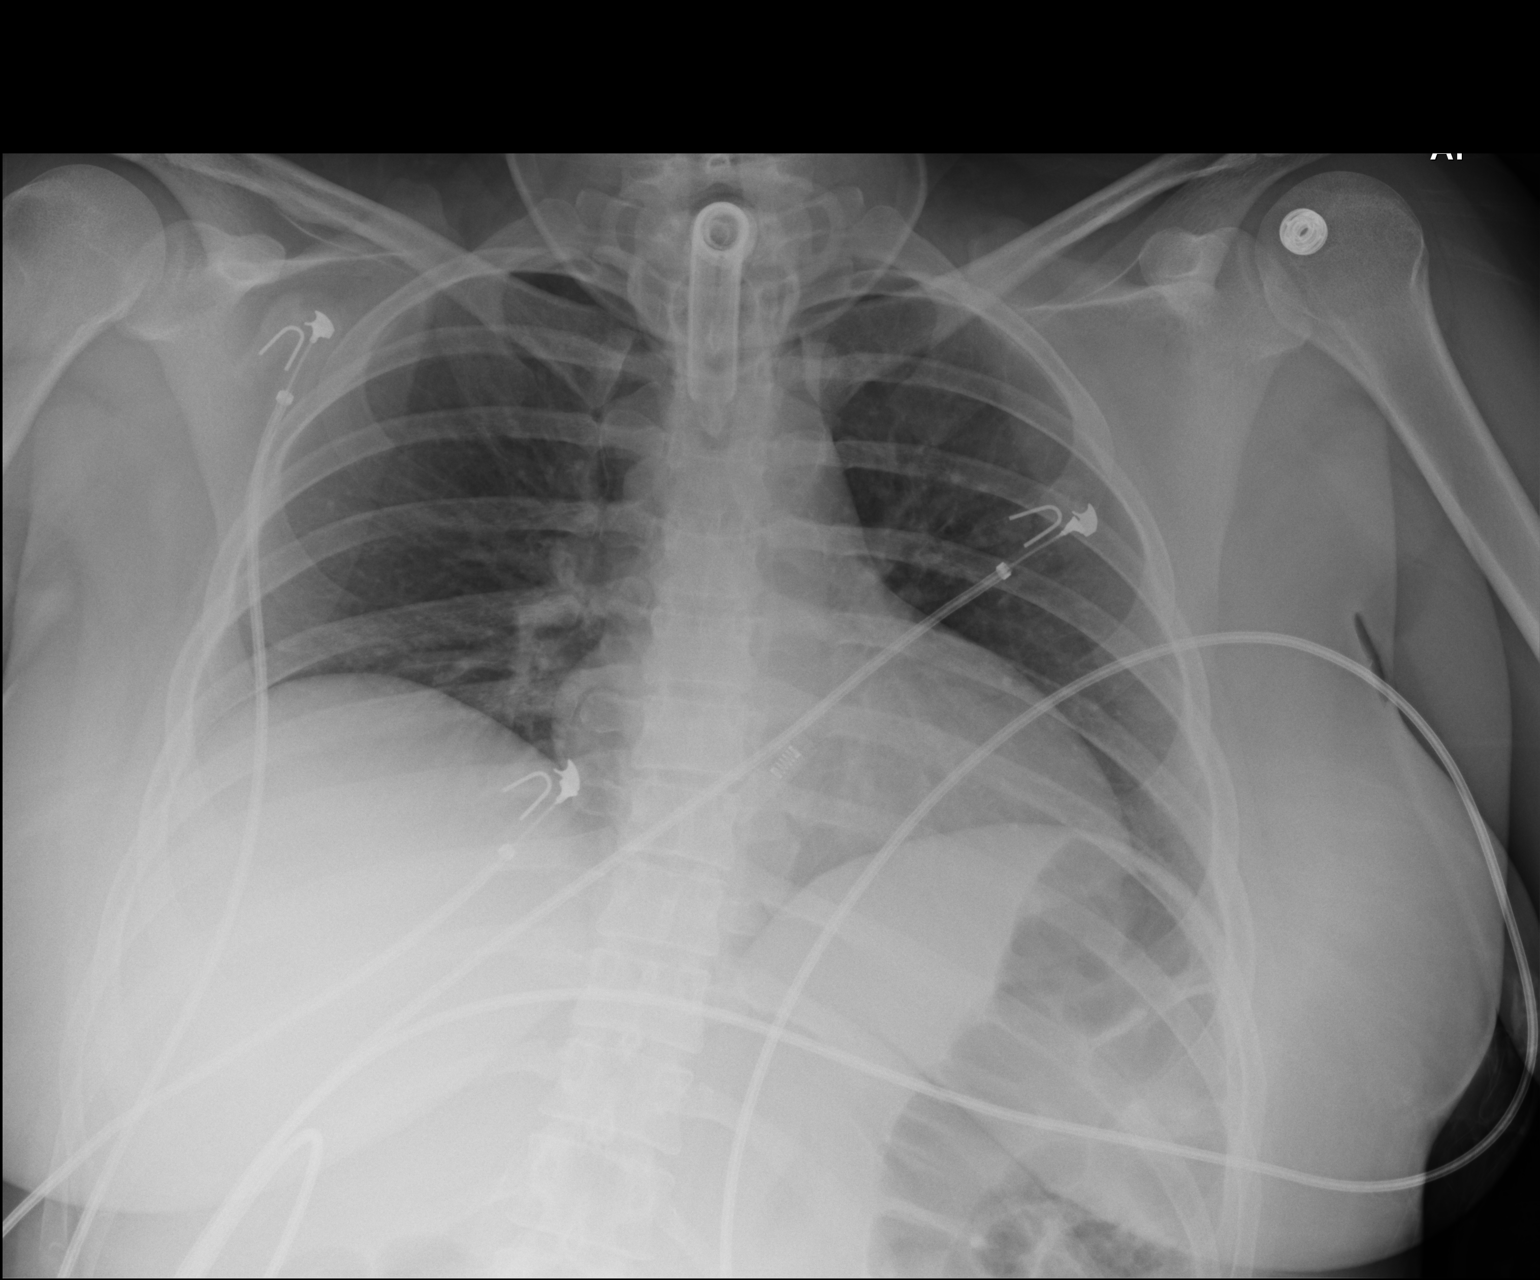

[1 of 1 positions shown; findings below may reference images not displayed]

FINDINGS: The heart is top-normal in size. The aorta is not aneurysmal. Low
lung volumes are noted without evidence of pulmonary edema nor
pneumonic consolidation. No pneumothorax or effusion. The tip of the
tracheostomy tube is approximately 2.7 cm above the carina.
IMPRESSION: No active disease. Tip of the tracheostomy tube approximately 2.7 cm
above the carina.

## 2018-11-05 IMAGING — CR DG ABDOMEN 1V
1 series · 1 of 1 positions shown · non-contrast
Comparison: None.

CLINICAL DATA: PEG tube placement

EXAM:
ABDOMEN - 1 VIEW

[AP]
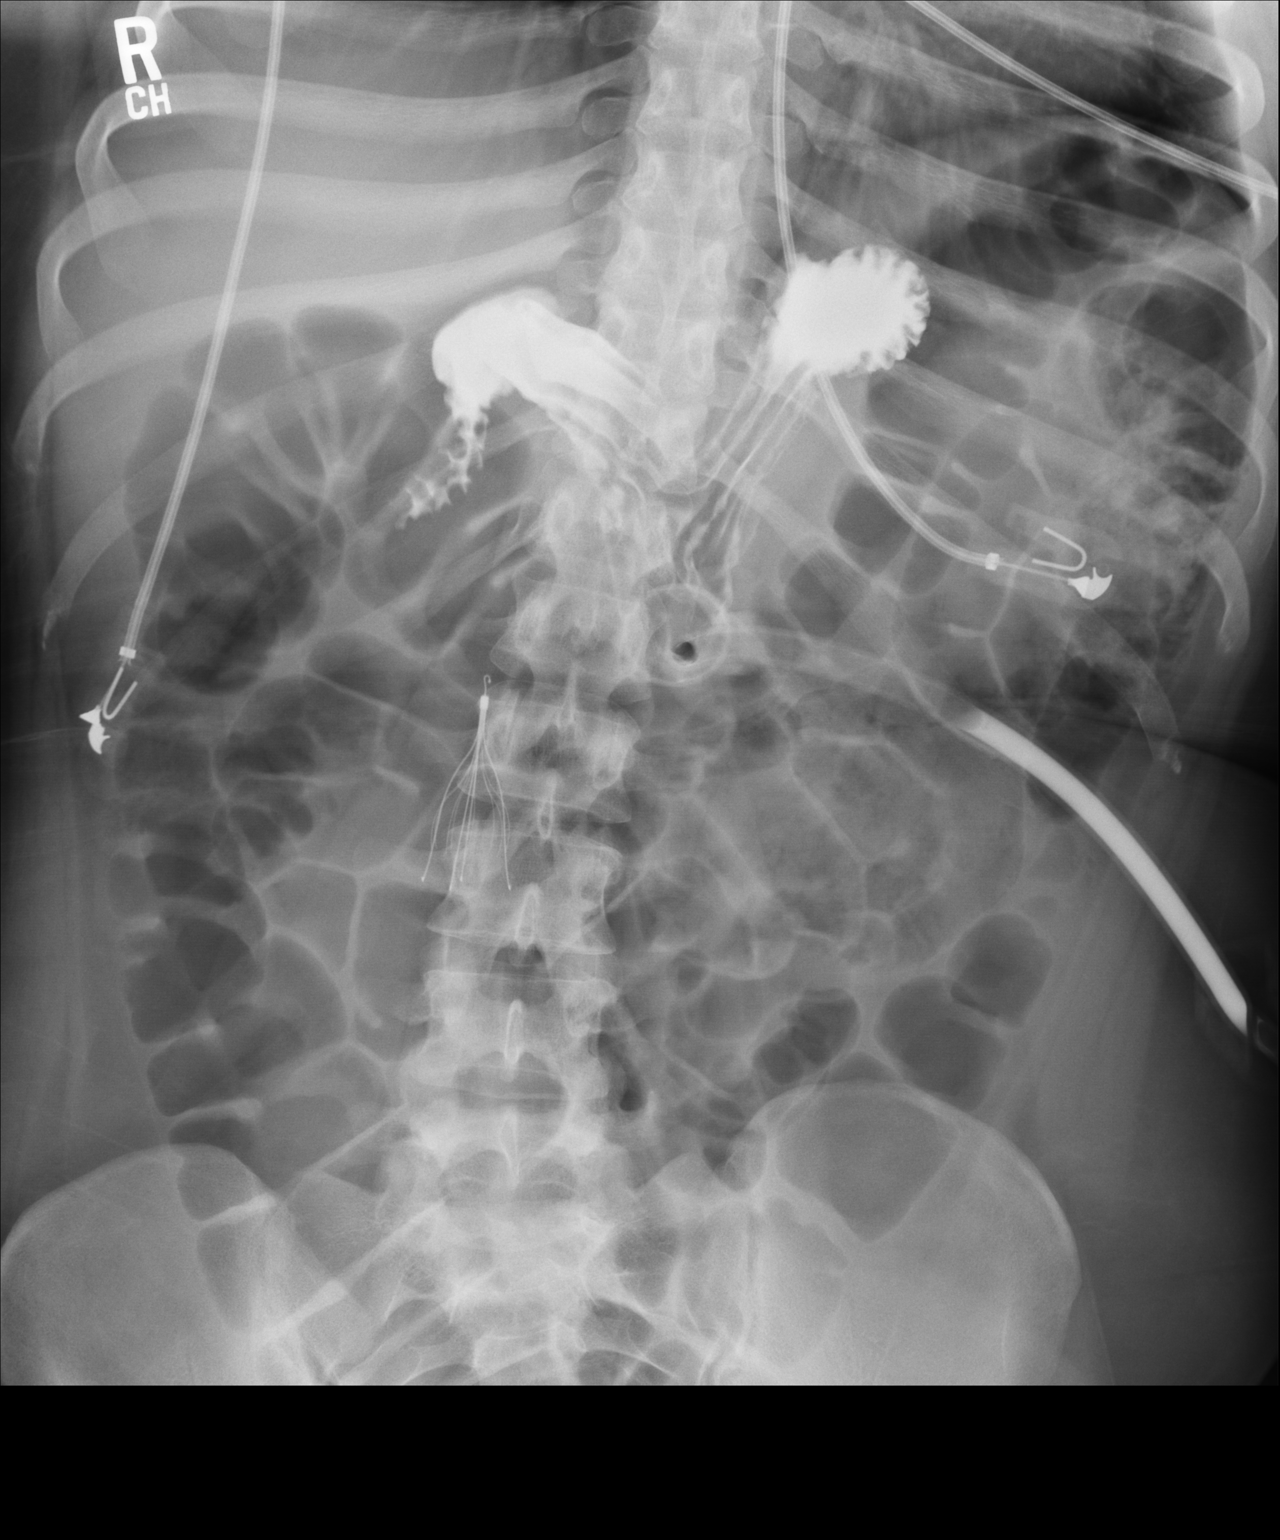

[1 of 1 positions shown; findings below may reference images not displayed]

FINDINGS: 30 cc of Isovue was injected through the patient's PEG tube. There
is visualization of the gastric rugae consistent with an
intraluminal position of the PEG tube tip within the stomach. The
tip appears be situated within the body of the stomach. IVC filter
is seen spanning superior endplate of L2 through mid L3 within the
medial right hemiabdomen. Bowel gas pattern is unremarkable.
IMPRESSION: PEG tube noted within the body of the stomach.
